# Patient Record
Sex: Female | Born: 1985
Health system: Southern US, Community
[De-identification: ages and names within clinical notes are randomized; demographics above are authoritative.]

## PROBLEM LIST (undated history)

## (undated) ENCOUNTER — Inpatient Hospital Stay: Payer: Self-pay

## (undated) DIAGNOSIS — F419 Anxiety disorder, unspecified: Secondary | ICD-10-CM

## (undated) DIAGNOSIS — R109 Unspecified abdominal pain: Secondary | ICD-10-CM

## (undated) DIAGNOSIS — F1910 Other psychoactive substance abuse, uncomplicated: Secondary | ICD-10-CM

## (undated) DIAGNOSIS — O219 Vomiting of pregnancy, unspecified: Secondary | ICD-10-CM

## (undated) DIAGNOSIS — Z3490 Encounter for supervision of normal pregnancy, unspecified, unspecified trimester: Secondary | ICD-10-CM

## (undated) DIAGNOSIS — K429 Umbilical hernia without obstruction or gangrene: Secondary | ICD-10-CM

## (undated) DIAGNOSIS — T148XXA Other injury of unspecified body region, initial encounter: Secondary | ICD-10-CM

## (undated) DIAGNOSIS — Z5329 Procedure and treatment not carried out because of patient's decision for other reasons: Secondary | ICD-10-CM

## (undated) DIAGNOSIS — O99213 Obesity complicating pregnancy, third trimester: Secondary | ICD-10-CM

## (undated) DIAGNOSIS — F319 Bipolar disorder, unspecified: Secondary | ICD-10-CM

## (undated) DIAGNOSIS — O26899 Other specified pregnancy related conditions, unspecified trimester: Secondary | ICD-10-CM

## (undated) DIAGNOSIS — Z91199 Patient's noncompliance with other medical treatment and regimen due to unspecified reason: Secondary | ICD-10-CM

## (undated) HISTORY — DX: Vomiting of pregnancy, unspecified: O21.9

## (undated) HISTORY — DX: Other injury of unspecified body region, initial encounter: T14.8XXA

## (undated) HISTORY — PX: TONSILLECTOMY: SUR1361

## (undated) HISTORY — PX: OVARY SURGERY: SHX727

## (undated) HISTORY — DX: Encounter for supervision of normal pregnancy, unspecified, unspecified trimester: Z34.90

## (undated) HISTORY — DX: Obesity complicating pregnancy, third trimester: O99.213

## (undated) HISTORY — DX: Other specified pregnancy related conditions, unspecified trimester: O26.899

## (undated) HISTORY — DX: Unspecified abdominal pain: R10.9

## (undated) HISTORY — DX: Umbilical hernia without obstruction or gangrene: K42.9

---

## 2007-12-09 ENCOUNTER — Emergency Department: Payer: Self-pay | Admitting: Emergency Medicine

## 2007-12-31 ENCOUNTER — Emergency Department: Payer: Self-pay | Admitting: Emergency Medicine

## 2008-01-02 ENCOUNTER — Emergency Department: Payer: Self-pay | Admitting: Internal Medicine

## 2008-05-09 ENCOUNTER — Emergency Department: Payer: Self-pay | Admitting: Emergency Medicine

## 2008-05-30 ENCOUNTER — Emergency Department: Payer: Self-pay | Admitting: Emergency Medicine

## 2008-07-19 ENCOUNTER — Emergency Department: Payer: Self-pay | Admitting: Emergency Medicine

## 2008-12-03 ENCOUNTER — Emergency Department: Payer: Self-pay | Admitting: Emergency Medicine

## 2008-12-06 ENCOUNTER — Emergency Department: Payer: Self-pay | Admitting: Emergency Medicine

## 2008-12-08 DIAGNOSIS — F17200 Nicotine dependence, unspecified, uncomplicated: Secondary | ICD-10-CM | POA: Diagnosis present

## 2010-03-11 ENCOUNTER — Emergency Department: Payer: Self-pay | Admitting: Unknown Physician Specialty

## 2010-04-08 ENCOUNTER — Ambulatory Visit: Payer: Self-pay | Admitting: Internal Medicine

## 2011-02-23 ENCOUNTER — Emergency Department: Payer: Self-pay | Admitting: Emergency Medicine

## 2011-04-09 HISTORY — PX: UNILATERAL SALPINGECTOMY: SHX6160

## 2011-06-27 DIAGNOSIS — Z0000 Encounter for general adult medical examination without abnormal findings: Secondary | ICD-10-CM | POA: Diagnosis not present

## 2011-06-27 DIAGNOSIS — B977 Papillomavirus as the cause of diseases classified elsewhere: Secondary | ICD-10-CM | POA: Diagnosis not present

## 2011-06-27 DIAGNOSIS — M6740 Ganglion, unspecified site: Secondary | ICD-10-CM | POA: Diagnosis not present

## 2011-06-27 DIAGNOSIS — R87612 Low grade squamous intraepithelial lesion on cytologic smear of cervix (LGSIL): Secondary | ICD-10-CM | POA: Diagnosis not present

## 2011-07-12 DIAGNOSIS — D219 Benign neoplasm of connective and other soft tissue, unspecified: Secondary | ICD-10-CM | POA: Diagnosis not present

## 2011-07-12 DIAGNOSIS — G5600 Carpal tunnel syndrome, unspecified upper limb: Secondary | ICD-10-CM | POA: Diagnosis not present

## 2011-07-14 ENCOUNTER — Emergency Department: Payer: Self-pay | Admitting: Emergency Medicine

## 2011-07-14 DIAGNOSIS — R109 Unspecified abdominal pain: Secondary | ICD-10-CM | POA: Diagnosis not present

## 2011-07-14 LAB — URINALYSIS, COMPLETE
Bilirubin,UR: NEGATIVE
Blood: NEGATIVE
Glucose,UR: NEGATIVE mg/dL (ref 0–75)
Leukocyte Esterase: NEGATIVE
Nitrite: NEGATIVE
Ph: 9 (ref 4.5–8.0)
Protein: 100
RBC,UR: 1 /HPF (ref 0–5)
Specific Gravity: 1.026 (ref 1.003–1.030)
Squamous Epithelial: 9
WBC UR: 1 /HPF (ref 0–5)

## 2011-07-14 LAB — PREGNANCY, URINE: Pregnancy Test, Urine: NEGATIVE m[IU]/mL

## 2011-07-22 DIAGNOSIS — W108XXA Fall (on) (from) other stairs and steps, initial encounter: Secondary | ICD-10-CM | POA: Diagnosis not present

## 2011-07-22 DIAGNOSIS — S301XXA Contusion of abdominal wall, initial encounter: Secondary | ICD-10-CM | POA: Diagnosis not present

## 2011-07-30 DIAGNOSIS — D219 Benign neoplasm of connective and other soft tissue, unspecified: Secondary | ICD-10-CM | POA: Diagnosis not present

## 2011-07-30 DIAGNOSIS — L980 Pyogenic granuloma: Secondary | ICD-10-CM | POA: Diagnosis not present

## 2011-07-30 DIAGNOSIS — G5600 Carpal tunnel syndrome, unspecified upper limb: Secondary | ICD-10-CM | POA: Diagnosis not present

## 2011-08-02 DIAGNOSIS — S7010XA Contusion of unspecified thigh, initial encounter: Secondary | ICD-10-CM | POA: Diagnosis not present

## 2011-08-02 DIAGNOSIS — E669 Obesity, unspecified: Secondary | ICD-10-CM | POA: Diagnosis not present

## 2011-08-08 DIAGNOSIS — N870 Mild cervical dysplasia: Secondary | ICD-10-CM | POA: Diagnosis not present

## 2011-08-08 DIAGNOSIS — N926 Irregular menstruation, unspecified: Secondary | ICD-10-CM | POA: Diagnosis not present

## 2011-08-08 DIAGNOSIS — R87612 Low grade squamous intraepithelial lesion on cytologic smear of cervix (LGSIL): Secondary | ICD-10-CM | POA: Diagnosis not present

## 2011-08-08 DIAGNOSIS — R87810 Cervical high risk human papillomavirus (HPV) DNA test positive: Secondary | ICD-10-CM | POA: Diagnosis not present

## 2011-08-12 DIAGNOSIS — D219 Benign neoplasm of connective and other soft tissue, unspecified: Secondary | ICD-10-CM | POA: Diagnosis not present

## 2011-09-25 DIAGNOSIS — E669 Obesity, unspecified: Secondary | ICD-10-CM | POA: Diagnosis not present

## 2011-09-25 DIAGNOSIS — S7010XA Contusion of unspecified thigh, initial encounter: Secondary | ICD-10-CM | POA: Diagnosis not present

## 2011-10-07 DIAGNOSIS — R112 Nausea with vomiting, unspecified: Secondary | ICD-10-CM | POA: Diagnosis not present

## 2011-10-07 DIAGNOSIS — R51 Headache: Secondary | ICD-10-CM | POA: Diagnosis not present

## 2011-10-09 ENCOUNTER — Emergency Department: Payer: Self-pay | Admitting: *Deleted

## 2011-10-09 DIAGNOSIS — R112 Nausea with vomiting, unspecified: Secondary | ICD-10-CM | POA: Diagnosis not present

## 2011-10-09 DIAGNOSIS — N83209 Unspecified ovarian cyst, unspecified side: Secondary | ICD-10-CM | POA: Diagnosis not present

## 2011-10-09 DIAGNOSIS — F17200 Nicotine dependence, unspecified, uncomplicated: Secondary | ICD-10-CM | POA: Diagnosis not present

## 2011-10-09 DIAGNOSIS — R1110 Vomiting, unspecified: Secondary | ICD-10-CM | POA: Diagnosis not present

## 2011-10-09 DIAGNOSIS — R1013 Epigastric pain: Secondary | ICD-10-CM | POA: Diagnosis not present

## 2011-10-09 DIAGNOSIS — D72829 Elevated white blood cell count, unspecified: Secondary | ICD-10-CM | POA: Diagnosis not present

## 2011-10-09 DIAGNOSIS — N838 Other noninflammatory disorders of ovary, fallopian tube and broad ligament: Secondary | ICD-10-CM | POA: Diagnosis not present

## 2011-10-09 LAB — CBC
HCT: 43.4 % (ref 35.0–47.0)
HGB: 14.1 g/dL (ref 12.0–16.0)
MCH: 29 pg (ref 26.0–34.0)
MCHC: 32.4 g/dL (ref 32.0–36.0)
MCV: 90 fL (ref 80–100)
Platelet: 260 10*3/uL (ref 150–440)
RBC: 4.85 10*6/uL (ref 3.80–5.20)
RDW: 13.2 % (ref 11.5–14.5)
WBC: 15.2 10*3/uL — ABNORMAL HIGH (ref 3.6–11.0)

## 2011-10-09 LAB — URINALYSIS, COMPLETE
Bilirubin,UR: NEGATIVE
Blood: NEGATIVE
Glucose,UR: NEGATIVE mg/dL (ref 0–75)
Leukocyte Esterase: NEGATIVE
Nitrite: NEGATIVE
Ph: 9 (ref 4.5–8.0)
Protein: >=500
RBC,UR: 16 /HPF (ref 0–5)
Specific Gravity: 1.028 (ref 1.003–1.030)
Squamous Epithelial: 1
WBC UR: NONE SEEN /HPF (ref 0–5)

## 2011-10-09 LAB — COMPREHENSIVE METABOLIC PANEL
Albumin: 3.8 g/dL (ref 3.4–5.0)
Alkaline Phosphatase: 92 U/L (ref 50–136)
Anion Gap: 3 — ABNORMAL LOW (ref 7–16)
BUN: 12 mg/dL (ref 7–18)
Bilirubin,Total: 0.4 mg/dL (ref 0.2–1.0)
Calcium, Total: 9.1 mg/dL (ref 8.5–10.1)
Chloride: 104 mmol/L (ref 98–107)
Co2: 31 mmol/L (ref 21–32)
Creatinine: 0.85 mg/dL (ref 0.60–1.30)
EGFR (African American): >60
EGFR (Non-African Amer.): >60
Glucose: 104 mg/dL — ABNORMAL HIGH (ref 65–99)
Osmolality: 276 (ref 275–301)
Potassium: 4.1 mmol/L (ref 3.5–5.1)
SGOT(AST): 21 U/L (ref 15–37)
SGPT (ALT): 27 U/L
Sodium: 138 mmol/L (ref 136–145)
Total Protein: 7.9 g/dL (ref 6.4–8.2)

## 2011-10-09 LAB — LIPASE, BLOOD: Lipase: 34 U/L — ABNORMAL LOW (ref 73–393)

## 2011-10-09 LAB — PREGNANCY, URINE: Pregnancy Test, Urine: NEGATIVE m[IU]/mL

## 2011-10-10 DIAGNOSIS — N838 Other noninflammatory disorders of ovary, fallopian tube and broad ligament: Secondary | ICD-10-CM | POA: Diagnosis not present

## 2011-10-10 DIAGNOSIS — R1013 Epigastric pain: Secondary | ICD-10-CM | POA: Diagnosis not present

## 2011-10-10 LAB — WET PREP, GENITAL

## 2011-10-12 DIAGNOSIS — R1032 Left lower quadrant pain: Secondary | ICD-10-CM | POA: Diagnosis not present

## 2011-10-12 DIAGNOSIS — I6200 Nontraumatic subdural hemorrhage, unspecified: Secondary | ICD-10-CM | POA: Diagnosis not present

## 2011-10-12 DIAGNOSIS — R112 Nausea with vomiting, unspecified: Secondary | ICD-10-CM | POA: Diagnosis not present

## 2011-10-13 DIAGNOSIS — S065X0A Traumatic subdural hemorrhage without loss of consciousness, initial encounter: Secondary | ICD-10-CM | POA: Diagnosis present

## 2011-10-13 DIAGNOSIS — S065X9A Traumatic subdural hemorrhage with loss of consciousness of unspecified duration, initial encounter: Secondary | ICD-10-CM | POA: Diagnosis not present

## 2011-10-13 DIAGNOSIS — S065XAA Traumatic subdural hemorrhage with loss of consciousness status unknown, initial encounter: Secondary | ICD-10-CM | POA: Insufficient documentation

## 2011-10-13 DIAGNOSIS — F17200 Nicotine dependence, unspecified, uncomplicated: Secondary | ICD-10-CM | POA: Diagnosis present

## 2011-10-13 DIAGNOSIS — I6200 Nontraumatic subdural hemorrhage, unspecified: Secondary | ICD-10-CM | POA: Diagnosis not present

## 2011-10-13 DIAGNOSIS — F319 Bipolar disorder, unspecified: Secondary | ICD-10-CM | POA: Diagnosis present

## 2011-10-13 DIAGNOSIS — E669 Obesity, unspecified: Secondary | ICD-10-CM | POA: Diagnosis present

## 2011-10-13 DIAGNOSIS — R1032 Left lower quadrant pain: Secondary | ICD-10-CM | POA: Diagnosis not present

## 2011-10-13 DIAGNOSIS — G936 Cerebral edema: Secondary | ICD-10-CM | POA: Diagnosis not present

## 2011-10-13 DIAGNOSIS — D72829 Elevated white blood cell count, unspecified: Secondary | ICD-10-CM | POA: Diagnosis present

## 2011-10-13 DIAGNOSIS — R112 Nausea with vomiting, unspecified: Secondary | ICD-10-CM | POA: Diagnosis not present

## 2011-10-13 DIAGNOSIS — Z7189 Other specified counseling: Secondary | ICD-10-CM | POA: Diagnosis not present

## 2011-10-13 DIAGNOSIS — N83209 Unspecified ovarian cyst, unspecified side: Secondary | ICD-10-CM | POA: Diagnosis present

## 2011-10-16 DIAGNOSIS — X58XXXA Exposure to other specified factors, initial encounter: Secondary | ICD-10-CM | POA: Diagnosis not present

## 2011-10-16 DIAGNOSIS — R4182 Altered mental status, unspecified: Secondary | ICD-10-CM | POA: Diagnosis not present

## 2011-10-16 DIAGNOSIS — I6200 Nontraumatic subdural hemorrhage, unspecified: Secondary | ICD-10-CM | POA: Diagnosis not present

## 2011-10-16 DIAGNOSIS — S065X9A Traumatic subdural hemorrhage with loss of consciousness of unspecified duration, initial encounter: Secondary | ICD-10-CM | POA: Diagnosis not present

## 2011-10-16 DIAGNOSIS — S065XAA Traumatic subdural hemorrhage with loss of consciousness status unknown, initial encounter: Secondary | ICD-10-CM | POA: Diagnosis not present

## 2011-10-17 DIAGNOSIS — F17200 Nicotine dependence, unspecified, uncomplicated: Secondary | ICD-10-CM | POA: Diagnosis present

## 2011-10-17 DIAGNOSIS — X58XXXA Exposure to other specified factors, initial encounter: Secondary | ICD-10-CM | POA: Diagnosis not present

## 2011-10-17 DIAGNOSIS — R4182 Altered mental status, unspecified: Secondary | ICD-10-CM | POA: Diagnosis present

## 2011-10-17 DIAGNOSIS — R569 Unspecified convulsions: Secondary | ICD-10-CM | POA: Diagnosis not present

## 2011-10-17 DIAGNOSIS — I6200 Nontraumatic subdural hemorrhage, unspecified: Secondary | ICD-10-CM | POA: Diagnosis present

## 2011-10-17 DIAGNOSIS — S065X9A Traumatic subdural hemorrhage with loss of consciousness of unspecified duration, initial encounter: Secondary | ICD-10-CM | POA: Diagnosis not present

## 2011-10-17 DIAGNOSIS — Z7189 Other specified counseling: Secondary | ICD-10-CM | POA: Diagnosis not present

## 2011-10-19 DIAGNOSIS — T148XXA Other injury of unspecified body region, initial encounter: Secondary | ICD-10-CM

## 2011-10-19 HISTORY — DX: Other injury of unspecified body region, initial encounter: T14.8XXA

## 2011-10-31 DIAGNOSIS — I6200 Nontraumatic subdural hemorrhage, unspecified: Secondary | ICD-10-CM | POA: Diagnosis not present

## 2011-10-31 DIAGNOSIS — Z87820 Personal history of traumatic brain injury: Secondary | ICD-10-CM | POA: Diagnosis not present

## 2011-10-31 DIAGNOSIS — S065X0A Traumatic subdural hemorrhage without loss of consciousness, initial encounter: Secondary | ICD-10-CM | POA: Diagnosis not present

## 2011-11-21 DIAGNOSIS — N979 Female infertility, unspecified: Secondary | ICD-10-CM | POA: Diagnosis not present

## 2011-11-21 DIAGNOSIS — N83209 Unspecified ovarian cyst, unspecified side: Secondary | ICD-10-CM | POA: Diagnosis not present

## 2011-11-21 DIAGNOSIS — Z3169 Encounter for other general counseling and advice on procreation: Secondary | ICD-10-CM | POA: Diagnosis not present

## 2011-12-02 DIAGNOSIS — Z202 Contact with and (suspected) exposure to infections with a predominantly sexual mode of transmission: Secondary | ICD-10-CM | POA: Diagnosis not present

## 2011-12-02 DIAGNOSIS — Z3169 Encounter for other general counseling and advice on procreation: Secondary | ICD-10-CM | POA: Diagnosis not present

## 2011-12-02 DIAGNOSIS — N83209 Unspecified ovarian cyst, unspecified side: Secondary | ICD-10-CM | POA: Diagnosis not present

## 2011-12-02 DIAGNOSIS — R1032 Left lower quadrant pain: Secondary | ICD-10-CM | POA: Diagnosis not present

## 2011-12-02 DIAGNOSIS — N949 Unspecified condition associated with female genital organs and menstrual cycle: Secondary | ICD-10-CM | POA: Diagnosis not present

## 2011-12-02 DIAGNOSIS — N979 Female infertility, unspecified: Secondary | ICD-10-CM | POA: Diagnosis not present

## 2011-12-04 ENCOUNTER — Ambulatory Visit: Payer: Self-pay | Admitting: Obstetrics & Gynecology

## 2011-12-04 DIAGNOSIS — N949 Unspecified condition associated with female genital organs and menstrual cycle: Secondary | ICD-10-CM | POA: Diagnosis not present

## 2011-12-04 DIAGNOSIS — D279 Benign neoplasm of unspecified ovary: Secondary | ICD-10-CM | POA: Diagnosis not present

## 2011-12-04 DIAGNOSIS — F411 Generalized anxiety disorder: Secondary | ICD-10-CM | POA: Diagnosis not present

## 2011-12-04 DIAGNOSIS — N83209 Unspecified ovarian cyst, unspecified side: Secondary | ICD-10-CM | POA: Diagnosis not present

## 2011-12-04 DIAGNOSIS — N926 Irregular menstruation, unspecified: Secondary | ICD-10-CM | POA: Diagnosis not present

## 2011-12-04 DIAGNOSIS — Z01812 Encounter for preprocedural laboratory examination: Secondary | ICD-10-CM | POA: Diagnosis not present

## 2011-12-04 LAB — CBC
HCT: 40.8 % (ref 35.0–47.0)
HGB: 13.6 g/dL (ref 12.0–16.0)
MCH: 29.7 pg (ref 26.0–34.0)
MCHC: 33.3 g/dL (ref 32.0–36.0)
MCV: 89 fL (ref 80–100)
Platelet: 224 10*3/uL (ref 150–440)
RBC: 4.59 10*6/uL (ref 3.80–5.20)
RDW: 13.7 % (ref 11.5–14.5)
WBC: 8.6 10*3/uL (ref 3.6–11.0)

## 2011-12-13 ENCOUNTER — Ambulatory Visit: Payer: Self-pay | Admitting: Obstetrics & Gynecology

## 2011-12-13 DIAGNOSIS — F17200 Nicotine dependence, unspecified, uncomplicated: Secondary | ICD-10-CM | POA: Diagnosis not present

## 2011-12-13 DIAGNOSIS — N8300 Follicular cyst of ovary, unspecified side: Secondary | ICD-10-CM | POA: Diagnosis not present

## 2011-12-13 DIAGNOSIS — D279 Benign neoplasm of unspecified ovary: Secondary | ICD-10-CM | POA: Diagnosis not present

## 2011-12-13 DIAGNOSIS — Z6834 Body mass index (BMI) 34.0-34.9, adult: Secondary | ICD-10-CM | POA: Diagnosis not present

## 2011-12-13 DIAGNOSIS — F411 Generalized anxiety disorder: Secondary | ICD-10-CM | POA: Diagnosis not present

## 2011-12-13 DIAGNOSIS — E669 Obesity, unspecified: Secondary | ICD-10-CM | POA: Diagnosis not present

## 2011-12-13 DIAGNOSIS — F319 Bipolar disorder, unspecified: Secondary | ICD-10-CM | POA: Diagnosis not present

## 2011-12-13 DIAGNOSIS — Z79899 Other long term (current) drug therapy: Secondary | ICD-10-CM | POA: Diagnosis not present

## 2011-12-13 DIAGNOSIS — K3189 Other diseases of stomach and duodenum: Secondary | ICD-10-CM | POA: Diagnosis not present

## 2011-12-13 LAB — DRUG SCREEN, URINE
Amphetamines, Ur Screen: NEGATIVE (ref ?–1000)
Barbiturates, Ur Screen: NEGATIVE (ref ?–200)
Benzodiazepine, Ur Scrn: NEGATIVE (ref ?–200)
Cannabinoid 50 Ng, Ur ~~LOC~~: POSITIVE (ref ?–50)
Cocaine Metabolite,Ur ~~LOC~~: NEGATIVE (ref ?–300)
MDMA (Ecstasy)Ur Screen: NEGATIVE (ref ?–500)
Methadone, Ur Screen: NEGATIVE (ref ?–300)
Opiate, Ur Screen: NEGATIVE (ref ?–300)
Phencyclidine (PCP) Ur S: NEGATIVE (ref ?–25)
Tricyclic, Ur Screen: NEGATIVE (ref ?–1000)

## 2011-12-13 LAB — PREGNANCY, URINE: Pregnancy Test, Urine: NEGATIVE m[IU]/mL

## 2011-12-17 LAB — PATHOLOGY REPORT

## 2012-01-02 DIAGNOSIS — Z3169 Encounter for other general counseling and advice on procreation: Secondary | ICD-10-CM | POA: Diagnosis not present

## 2012-01-02 DIAGNOSIS — N979 Female infertility, unspecified: Secondary | ICD-10-CM | POA: Diagnosis not present

## 2012-01-02 DIAGNOSIS — E669 Obesity, unspecified: Secondary | ICD-10-CM | POA: Diagnosis not present

## 2012-03-22 ENCOUNTER — Emergency Department: Payer: Self-pay | Admitting: Internal Medicine

## 2012-03-25 ENCOUNTER — Inpatient Hospital Stay: Payer: Self-pay | Admitting: Surgery

## 2012-03-25 DIAGNOSIS — K810 Acute cholecystitis: Secondary | ICD-10-CM | POA: Diagnosis not present

## 2012-03-25 DIAGNOSIS — R112 Nausea with vomiting, unspecified: Secondary | ICD-10-CM | POA: Diagnosis not present

## 2012-03-25 DIAGNOSIS — R109 Unspecified abdominal pain: Secondary | ICD-10-CM | POA: Diagnosis not present

## 2012-03-25 LAB — COMPREHENSIVE METABOLIC PANEL
Albumin: 4 g/dL (ref 3.4–5.0)
Alkaline Phosphatase: 86 U/L (ref 50–136)
Anion Gap: 8 (ref 7–16)
BUN: 10 mg/dL (ref 7–18)
Bilirubin,Total: 0.5 mg/dL (ref 0.2–1.0)
Calcium, Total: 8.8 mg/dL (ref 8.5–10.1)
Chloride: 107 mmol/L (ref 98–107)
Co2: 23 mmol/L (ref 21–32)
Creatinine: 0.68 mg/dL (ref 0.60–1.30)
EGFR (African American): >60
EGFR (Non-African Amer.): >60
Glucose: 144 mg/dL — ABNORMAL HIGH (ref 65–99)
Osmolality: 277 (ref 275–301)
Potassium: 3.4 mmol/L — ABNORMAL LOW (ref 3.5–5.1)
SGOT(AST): 8 U/L — ABNORMAL LOW (ref 15–37)
SGPT (ALT): 16 U/L (ref 12–78)
Sodium: 138 mmol/L (ref 136–145)
Total Protein: 7.7 g/dL (ref 6.4–8.2)

## 2012-03-25 LAB — CBC
HCT: 41.6 % (ref 35.0–47.0)
HGB: 13.9 g/dL (ref 12.0–16.0)
MCH: 29.6 pg (ref 26.0–34.0)
MCHC: 33.5 g/dL (ref 32.0–36.0)
MCV: 88 fL (ref 80–100)
Platelet: 217 10*3/uL (ref 150–440)
RBC: 4.71 10*6/uL (ref 3.80–5.20)
RDW: 13.6 % (ref 11.5–14.5)
WBC: 12.6 10*3/uL — ABNORMAL HIGH (ref 3.6–11.0)

## 2012-03-25 LAB — URINALYSIS, COMPLETE
Bacteria: NONE SEEN
Bilirubin,UR: NEGATIVE
Blood: NEGATIVE
Glucose,UR: NEGATIVE mg/dL (ref 0–75)
Leukocyte Esterase: NEGATIVE
Nitrite: NEGATIVE
Ph: 9 (ref 4.5–8.0)
Protein: NEGATIVE
RBC,UR: 1 /HPF (ref 0–5)
Specific Gravity: 1.014 (ref 1.003–1.030)
Squamous Epithelial: 7
WBC UR: 2 /HPF (ref 0–5)

## 2012-03-25 LAB — LIPASE, BLOOD: Lipase: 63 U/L — ABNORMAL LOW (ref 73–393)

## 2012-03-25 LAB — DRUG SCREEN, URINE

## 2012-03-26 DIAGNOSIS — R1011 Right upper quadrant pain: Secondary | ICD-10-CM | POA: Diagnosis not present

## 2012-03-26 LAB — COMPREHENSIVE METABOLIC PANEL
Albumin: 3.5 g/dL (ref 3.4–5.0)
Alkaline Phosphatase: 80 U/L (ref 50–136)
Anion Gap: 6 — ABNORMAL LOW (ref 7–16)
BUN: 6 mg/dL — ABNORMAL LOW (ref 7–18)
Bilirubin,Total: 0.4 mg/dL (ref 0.2–1.0)
Calcium, Total: 8.9 mg/dL (ref 8.5–10.1)
Chloride: 109 mmol/L — ABNORMAL HIGH (ref 98–107)
Co2: 28 mmol/L (ref 21–32)
Creatinine: 0.74 mg/dL (ref 0.60–1.30)
EGFR (African American): >60
EGFR (Non-African Amer.): >60
Glucose: 86 mg/dL (ref 65–99)
Osmolality: 282 (ref 275–301)
Potassium: 3.3 mmol/L — ABNORMAL LOW (ref 3.5–5.1)
SGOT(AST): 16 U/L (ref 15–37)
SGPT (ALT): 15 U/L (ref 12–78)
Sodium: 143 mmol/L (ref 136–145)
Total Protein: 6.5 g/dL (ref 6.4–8.2)

## 2012-03-26 LAB — CBC WITH DIFFERENTIAL/PLATELET
Basophil #: 0 10*3/uL (ref 0.0–0.1)
Basophil %: 0.3 %
Eosinophil #: 0 10*3/uL (ref 0.0–0.7)
Eosinophil %: 0.2 %
HCT: 39.8 % (ref 35.0–47.0)
HGB: 13.2 g/dL (ref 12.0–16.0)
Lymphocyte #: 3.5 10*3/uL (ref 1.0–3.6)
Lymphocyte %: 27.8 %
MCH: 29.6 pg (ref 26.0–34.0)
MCHC: 33.1 g/dL (ref 32.0–36.0)
MCV: 89 fL (ref 80–100)
Monocyte #: 1.2 x10 3/mm — ABNORMAL HIGH (ref 0.2–0.9)
Monocyte %: 9 %
Neutrophil #: 8 10*3/uL — ABNORMAL HIGH (ref 1.4–6.5)
Neutrophil %: 62.7 %
Platelet: 217 10*3/uL (ref 150–440)
RBC: 4.45 10*6/uL (ref 3.80–5.20)
RDW: 13.5 % (ref 11.5–14.5)
WBC: 12.8 10*3/uL — ABNORMAL HIGH (ref 3.6–11.0)

## 2012-04-20 DIAGNOSIS — E669 Obesity, unspecified: Secondary | ICD-10-CM | POA: Diagnosis not present

## 2012-04-20 DIAGNOSIS — N979 Female infertility, unspecified: Secondary | ICD-10-CM | POA: Diagnosis not present

## 2012-04-20 DIAGNOSIS — F410 Panic disorder [episodic paroxysmal anxiety] without agoraphobia: Secondary | ICD-10-CM | POA: Diagnosis not present

## 2012-06-02 DIAGNOSIS — F410 Panic disorder [episodic paroxysmal anxiety] without agoraphobia: Secondary | ICD-10-CM | POA: Diagnosis not present

## 2012-06-02 DIAGNOSIS — N979 Female infertility, unspecified: Secondary | ICD-10-CM | POA: Diagnosis not present

## 2012-06-02 DIAGNOSIS — M7650 Patellar tendinitis, unspecified knee: Secondary | ICD-10-CM | POA: Diagnosis not present

## 2012-06-27 ENCOUNTER — Emergency Department: Payer: Self-pay | Admitting: Emergency Medicine

## 2012-06-27 DIAGNOSIS — R112 Nausea with vomiting, unspecified: Secondary | ICD-10-CM | POA: Diagnosis not present

## 2012-06-27 DIAGNOSIS — R1084 Generalized abdominal pain: Secondary | ICD-10-CM | POA: Diagnosis not present

## 2012-06-27 DIAGNOSIS — R1011 Right upper quadrant pain: Secondary | ICD-10-CM | POA: Diagnosis not present

## 2012-06-27 DIAGNOSIS — K828 Other specified diseases of gallbladder: Secondary | ICD-10-CM | POA: Diagnosis not present

## 2012-06-27 DIAGNOSIS — K8020 Calculus of gallbladder without cholecystitis without obstruction: Secondary | ICD-10-CM | POA: Diagnosis not present

## 2012-06-27 DIAGNOSIS — Z9889 Other specified postprocedural states: Secondary | ICD-10-CM | POA: Diagnosis not present

## 2012-06-27 LAB — URINALYSIS, COMPLETE
Bilirubin,UR: NEGATIVE
Blood: NEGATIVE
Glucose,UR: NEGATIVE mg/dL (ref 0–75)
Ketone: NEGATIVE
Nitrite: NEGATIVE
Ph: 8 (ref 4.5–8.0)
Protein: 30
RBC,UR: 1 /HPF (ref 0–5)
Specific Gravity: 1.024 (ref 1.003–1.030)
Squamous Epithelial: 16
WBC UR: 1 /HPF (ref 0–5)

## 2012-06-27 LAB — COMPREHENSIVE METABOLIC PANEL
Albumin: 3.5 g/dL (ref 3.4–5.0)
Alkaline Phosphatase: 94 U/L (ref 50–136)
Anion Gap: 7 (ref 7–16)
BUN: 8 mg/dL (ref 7–18)
Bilirubin,Total: 0.4 mg/dL (ref 0.2–1.0)
Calcium, Total: 8.8 mg/dL (ref 8.5–10.1)
Chloride: 107 mmol/L (ref 98–107)
Co2: 26 mmol/L (ref 21–32)
Creatinine: 0.71 mg/dL (ref 0.60–1.30)
EGFR (African American): >60
EGFR (Non-African Amer.): >60
Glucose: 115 mg/dL — ABNORMAL HIGH (ref 65–99)
Osmolality: 279 (ref 275–301)
Potassium: 3.6 mmol/L (ref 3.5–5.1)
SGOT(AST): 22 U/L (ref 15–37)
SGPT (ALT): 19 U/L (ref 12–78)
Sodium: 140 mmol/L (ref 136–145)
Total Protein: 7.5 g/dL (ref 6.4–8.2)

## 2012-06-27 LAB — CBC WITH DIFFERENTIAL/PLATELET
Basophil #: 0.1 10*3/uL (ref 0.0–0.1)
Basophil %: 0.7 %
Eosinophil #: 0 10*3/uL (ref 0.0–0.7)
Eosinophil %: 0.4 %
HCT: 43.5 % (ref 35.0–47.0)
HGB: 14.8 g/dL (ref 12.0–16.0)
Lymphocyte #: 2.2 10*3/uL (ref 1.0–3.6)
Lymphocyte %: 20.5 %
MCH: 29.5 pg (ref 26.0–34.0)
MCHC: 34 g/dL (ref 32.0–36.0)
MCV: 87 fL (ref 80–100)
Monocyte #: 1.1 x10 3/mm — ABNORMAL HIGH (ref 0.2–0.9)
Monocyte %: 10.2 %
Neutrophil #: 7.5 10*3/uL — ABNORMAL HIGH (ref 1.4–6.5)
Neutrophil %: 68.2 %
Platelet: 228 10*3/uL (ref 150–440)
RBC: 5.01 10*6/uL (ref 3.80–5.20)
RDW: 14 % (ref 11.5–14.5)
WBC: 10.9 10*3/uL (ref 3.6–11.0)

## 2012-06-27 LAB — LIPASE, BLOOD: Lipase: 60 U/L — ABNORMAL LOW (ref 73–393)

## 2012-06-27 LAB — PREGNANCY, URINE: Pregnancy Test, Urine: NEGATIVE m[IU]/mL

## 2012-07-18 DIAGNOSIS — R1110 Vomiting, unspecified: Secondary | ICD-10-CM | POA: Diagnosis not present

## 2012-07-18 DIAGNOSIS — F17200 Nicotine dependence, unspecified, uncomplicated: Secondary | ICD-10-CM | POA: Diagnosis not present

## 2012-07-18 DIAGNOSIS — R1011 Right upper quadrant pain: Secondary | ICD-10-CM | POA: Diagnosis not present

## 2012-07-18 DIAGNOSIS — K828 Other specified diseases of gallbladder: Secondary | ICD-10-CM | POA: Diagnosis not present

## 2012-07-18 DIAGNOSIS — R109 Unspecified abdominal pain: Secondary | ICD-10-CM | POA: Diagnosis not present

## 2012-07-18 DIAGNOSIS — R197 Diarrhea, unspecified: Secondary | ICD-10-CM | POA: Diagnosis not present

## 2012-07-18 DIAGNOSIS — R1013 Epigastric pain: Secondary | ICD-10-CM | POA: Diagnosis not present

## 2012-07-19 DIAGNOSIS — K8020 Calculus of gallbladder without cholecystitis without obstruction: Secondary | ICD-10-CM | POA: Diagnosis not present

## 2012-07-19 DIAGNOSIS — R1011 Right upper quadrant pain: Secondary | ICD-10-CM | POA: Diagnosis not present

## 2012-11-30 DIAGNOSIS — M25569 Pain in unspecified knee: Secondary | ICD-10-CM | POA: Diagnosis not present

## 2012-11-30 DIAGNOSIS — F410 Panic disorder [episodic paroxysmal anxiety] without agoraphobia: Secondary | ICD-10-CM | POA: Diagnosis not present

## 2012-11-30 DIAGNOSIS — E669 Obesity, unspecified: Secondary | ICD-10-CM | POA: Diagnosis not present

## 2012-12-04 DIAGNOSIS — M2240 Chondromalacia patellae, unspecified knee: Secondary | ICD-10-CM | POA: Diagnosis not present

## 2012-12-15 DIAGNOSIS — M25569 Pain in unspecified knee: Secondary | ICD-10-CM | POA: Diagnosis not present

## 2012-12-15 DIAGNOSIS — R109 Unspecified abdominal pain: Secondary | ICD-10-CM | POA: Diagnosis not present

## 2012-12-15 DIAGNOSIS — H9209 Otalgia, unspecified ear: Secondary | ICD-10-CM | POA: Diagnosis not present

## 2013-02-24 DIAGNOSIS — F341 Dysthymic disorder: Secondary | ICD-10-CM | POA: Diagnosis not present

## 2013-02-24 DIAGNOSIS — M25569 Pain in unspecified knee: Secondary | ICD-10-CM | POA: Diagnosis not present

## 2013-02-24 DIAGNOSIS — E669 Obesity, unspecified: Secondary | ICD-10-CM | POA: Diagnosis not present

## 2013-02-25 DIAGNOSIS — R1084 Generalized abdominal pain: Secondary | ICD-10-CM | POA: Diagnosis not present

## 2013-03-23 DIAGNOSIS — F3110 Bipolar disorder, current episode manic without psychotic features, unspecified: Secondary | ICD-10-CM | POA: Diagnosis not present

## 2013-03-23 DIAGNOSIS — F411 Generalized anxiety disorder: Secondary | ICD-10-CM | POA: Diagnosis not present

## 2013-03-23 DIAGNOSIS — F909 Attention-deficit hyperactivity disorder, unspecified type: Secondary | ICD-10-CM | POA: Diagnosis not present

## 2013-04-08 HISTORY — PX: THROAT SURGERY: SHX803

## 2013-04-13 DIAGNOSIS — F909 Attention-deficit hyperactivity disorder, unspecified type: Secondary | ICD-10-CM | POA: Diagnosis not present

## 2013-04-13 DIAGNOSIS — E669 Obesity, unspecified: Secondary | ICD-10-CM | POA: Diagnosis not present

## 2013-04-13 DIAGNOSIS — M25569 Pain in unspecified knee: Secondary | ICD-10-CM | POA: Diagnosis not present

## 2013-04-13 DIAGNOSIS — L301 Dyshidrosis [pompholyx]: Secondary | ICD-10-CM | POA: Diagnosis not present

## 2013-04-20 DIAGNOSIS — F3110 Bipolar disorder, current episode manic without psychotic features, unspecified: Secondary | ICD-10-CM | POA: Diagnosis not present

## 2013-04-20 DIAGNOSIS — M25569 Pain in unspecified knee: Secondary | ICD-10-CM | POA: Diagnosis not present

## 2013-04-20 DIAGNOSIS — R609 Edema, unspecified: Secondary | ICD-10-CM | POA: Diagnosis not present

## 2013-04-20 DIAGNOSIS — E669 Obesity, unspecified: Secondary | ICD-10-CM | POA: Diagnosis not present

## 2013-04-20 DIAGNOSIS — F909 Attention-deficit hyperactivity disorder, unspecified type: Secondary | ICD-10-CM | POA: Diagnosis not present

## 2013-04-20 DIAGNOSIS — F411 Generalized anxiety disorder: Secondary | ICD-10-CM | POA: Diagnosis not present

## 2013-05-18 DIAGNOSIS — F4001 Agoraphobia with panic disorder: Secondary | ICD-10-CM | POA: Diagnosis not present

## 2013-05-18 DIAGNOSIS — F909 Attention-deficit hyperactivity disorder, unspecified type: Secondary | ICD-10-CM | POA: Diagnosis not present

## 2013-05-18 DIAGNOSIS — F411 Generalized anxiety disorder: Secondary | ICD-10-CM | POA: Diagnosis not present

## 2013-05-18 DIAGNOSIS — F3110 Bipolar disorder, current episode manic without psychotic features, unspecified: Secondary | ICD-10-CM | POA: Diagnosis not present

## 2013-06-15 DIAGNOSIS — F411 Generalized anxiety disorder: Secondary | ICD-10-CM | POA: Diagnosis not present

## 2013-06-15 DIAGNOSIS — F4001 Agoraphobia with panic disorder: Secondary | ICD-10-CM | POA: Diagnosis not present

## 2013-06-15 DIAGNOSIS — F909 Attention-deficit hyperactivity disorder, unspecified type: Secondary | ICD-10-CM | POA: Diagnosis not present

## 2013-06-15 DIAGNOSIS — F3110 Bipolar disorder, current episode manic without psychotic features, unspecified: Secondary | ICD-10-CM | POA: Diagnosis not present

## 2013-06-24 DIAGNOSIS — M25569 Pain in unspecified knee: Secondary | ICD-10-CM | POA: Diagnosis not present

## 2013-06-24 DIAGNOSIS — J029 Acute pharyngitis, unspecified: Secondary | ICD-10-CM | POA: Diagnosis not present

## 2013-06-24 DIAGNOSIS — R490 Dysphonia: Secondary | ICD-10-CM | POA: Diagnosis not present

## 2013-07-01 DIAGNOSIS — Z331 Pregnant state, incidental: Secondary | ICD-10-CM | POA: Diagnosis not present

## 2013-07-01 DIAGNOSIS — M25569 Pain in unspecified knee: Secondary | ICD-10-CM | POA: Diagnosis not present

## 2013-07-01 DIAGNOSIS — R599 Enlarged lymph nodes, unspecified: Secondary | ICD-10-CM | POA: Diagnosis not present

## 2013-07-05 ENCOUNTER — Emergency Department: Payer: Self-pay | Admitting: Emergency Medicine

## 2013-07-09 ENCOUNTER — Emergency Department: Payer: Self-pay | Admitting: Emergency Medicine

## 2013-07-09 DIAGNOSIS — R109 Unspecified abdominal pain: Secondary | ICD-10-CM | POA: Diagnosis not present

## 2013-07-09 DIAGNOSIS — O9989 Other specified diseases and conditions complicating pregnancy, childbirth and the puerperium: Secondary | ICD-10-CM | POA: Diagnosis not present

## 2013-07-09 LAB — CBC WITH DIFFERENTIAL/PLATELET
Basophil #: 0.1 10*3/uL (ref 0.0–0.1)
Basophil %: 0.4 %
Eosinophil #: 0 10*3/uL (ref 0.0–0.7)
Eosinophil %: 0 %
HCT: 43.7 % (ref 35.0–47.0)
HGB: 14.5 g/dL (ref 12.0–16.0)
Lymphocyte #: 1 10*3/uL (ref 1.0–3.6)
Lymphocyte %: 6.6 %
MCH: 29.5 pg (ref 26.0–34.0)
MCHC: 33.3 g/dL (ref 32.0–36.0)
MCV: 89 fL (ref 80–100)
Monocyte #: 0.2 x10 3/mm (ref 0.2–0.9)
Monocyte %: 1.6 %
Neutrophil #: 13.3 10*3/uL — ABNORMAL HIGH (ref 1.4–6.5)
Neutrophil %: 91.4 %
Platelet: 225 10*3/uL (ref 150–440)
RBC: 4.93 10*6/uL (ref 3.80–5.20)
RDW: 12.7 % (ref 11.5–14.5)
WBC: 14.5 10*3/uL — ABNORMAL HIGH (ref 3.6–11.0)

## 2013-07-09 LAB — HCG, QUANTITATIVE, PREGNANCY: Beta Hcg, Quant.: 37046 m[IU]/mL — ABNORMAL HIGH

## 2013-07-09 LAB — COMPREHENSIVE METABOLIC PANEL
Albumin: 3.7 g/dL (ref 3.4–5.0)
Alkaline Phosphatase: 67 U/L
Anion Gap: 10 (ref 7–16)
BUN: 10 mg/dL (ref 7–18)
Bilirubin,Total: 0.4 mg/dL (ref 0.2–1.0)
Calcium, Total: 8.9 mg/dL (ref 8.5–10.1)
Chloride: 106 mmol/L (ref 98–107)
Co2: 21 mmol/L (ref 21–32)
Creatinine: 0.61 mg/dL (ref 0.60–1.30)
EGFR (African American): >60
EGFR (Non-African Amer.): >60
Glucose: 118 mg/dL — ABNORMAL HIGH (ref 65–99)
Osmolality: 274 (ref 275–301)
Potassium: 3.6 mmol/L (ref 3.5–5.1)
SGOT(AST): 12 U/L — ABNORMAL LOW (ref 15–37)
SGPT (ALT): 18 U/L (ref 12–78)
Sodium: 137 mmol/L (ref 136–145)
Total Protein: 7.7 g/dL (ref 6.4–8.2)

## 2013-07-09 LAB — LIPASE, BLOOD: Lipase: 60 U/L — ABNORMAL LOW (ref 73–393)

## 2013-07-10 DIAGNOSIS — O99330 Smoking (tobacco) complicating pregnancy, unspecified trimester: Secondary | ICD-10-CM | POA: Diagnosis not present

## 2013-07-10 DIAGNOSIS — R1110 Vomiting, unspecified: Secondary | ICD-10-CM | POA: Diagnosis not present

## 2013-07-10 DIAGNOSIS — F411 Generalized anxiety disorder: Secondary | ICD-10-CM | POA: Diagnosis not present

## 2013-07-10 DIAGNOSIS — O99891 Other specified diseases and conditions complicating pregnancy: Secondary | ICD-10-CM | POA: Diagnosis not present

## 2013-07-10 DIAGNOSIS — R1013 Epigastric pain: Secondary | ICD-10-CM | POA: Diagnosis not present

## 2013-07-10 DIAGNOSIS — R1011 Right upper quadrant pain: Secondary | ICD-10-CM | POA: Diagnosis not present

## 2013-07-10 DIAGNOSIS — Z79899 Other long term (current) drug therapy: Secondary | ICD-10-CM | POA: Diagnosis not present

## 2013-07-10 DIAGNOSIS — O210 Mild hyperemesis gravidarum: Secondary | ICD-10-CM | POA: Diagnosis not present

## 2013-07-10 DIAGNOSIS — Z36 Encounter for antenatal screening of mother: Secondary | ICD-10-CM | POA: Diagnosis not present

## 2013-07-16 DIAGNOSIS — Z3480 Encounter for supervision of other normal pregnancy, unspecified trimester: Secondary | ICD-10-CM | POA: Diagnosis not present

## 2013-07-16 DIAGNOSIS — R87612 Low grade squamous intraepithelial lesion on cytologic smear of cervix (LGSIL): Secondary | ICD-10-CM | POA: Diagnosis not present

## 2013-07-16 DIAGNOSIS — R87610 Atypical squamous cells of undetermined significance on cytologic smear of cervix (ASC-US): Secondary | ICD-10-CM | POA: Diagnosis not present

## 2013-07-16 DIAGNOSIS — Z36 Encounter for antenatal screening of mother: Secondary | ICD-10-CM | POA: Diagnosis not present

## 2013-07-16 DIAGNOSIS — Z113 Encounter for screening for infections with a predominantly sexual mode of transmission: Secondary | ICD-10-CM | POA: Diagnosis not present

## 2013-07-19 DIAGNOSIS — O99891 Other specified diseases and conditions complicating pregnancy: Secondary | ICD-10-CM | POA: Diagnosis not present

## 2013-07-19 DIAGNOSIS — O99330 Smoking (tobacco) complicating pregnancy, unspecified trimester: Secondary | ICD-10-CM | POA: Diagnosis not present

## 2013-07-19 DIAGNOSIS — F411 Generalized anxiety disorder: Secondary | ICD-10-CM | POA: Diagnosis not present

## 2013-07-19 DIAGNOSIS — O210 Mild hyperemesis gravidarum: Secondary | ICD-10-CM | POA: Diagnosis not present

## 2013-07-19 DIAGNOSIS — R1013 Epigastric pain: Secondary | ICD-10-CM | POA: Diagnosis not present

## 2013-07-22 DIAGNOSIS — O9989 Other specified diseases and conditions complicating pregnancy, childbirth and the puerperium: Secondary | ICD-10-CM | POA: Diagnosis not present

## 2013-07-22 DIAGNOSIS — E669 Obesity, unspecified: Secondary | ICD-10-CM | POA: Diagnosis not present

## 2013-07-22 DIAGNOSIS — Z3480 Encounter for supervision of other normal pregnancy, unspecified trimester: Secondary | ICD-10-CM | POA: Diagnosis not present

## 2013-07-22 DIAGNOSIS — Z131 Encounter for screening for diabetes mellitus: Secondary | ICD-10-CM | POA: Diagnosis not present

## 2013-08-20 DIAGNOSIS — Z3480 Encounter for supervision of other normal pregnancy, unspecified trimester: Secondary | ICD-10-CM | POA: Diagnosis not present

## 2013-08-20 DIAGNOSIS — Z36 Encounter for antenatal screening of mother: Secondary | ICD-10-CM | POA: Diagnosis not present

## 2013-08-20 DIAGNOSIS — IMO0002 Reserved for concepts with insufficient information to code with codable children: Secondary | ICD-10-CM | POA: Diagnosis not present

## 2013-09-22 DIAGNOSIS — F4001 Agoraphobia with panic disorder: Secondary | ICD-10-CM | POA: Diagnosis not present

## 2013-09-22 DIAGNOSIS — F3110 Bipolar disorder, current episode manic without psychotic features, unspecified: Secondary | ICD-10-CM | POA: Diagnosis not present

## 2013-09-22 DIAGNOSIS — F909 Attention-deficit hyperactivity disorder, unspecified type: Secondary | ICD-10-CM | POA: Diagnosis not present

## 2013-09-22 DIAGNOSIS — F411 Generalized anxiety disorder: Secondary | ICD-10-CM | POA: Diagnosis not present

## 2013-10-14 IMAGING — US US PELV - US TRANSVAGINAL
1 series · 14 of 25 positions shown · non-contrast
Comparison: none

REASON FOR EXAM: left cystic mass on ct, r/o torsion vs abscess
COMMENTS:

[Series 1: us pelv - us transvaginal · 0.25mm/px · 14 of 58 slices shown]
[im 1/58]
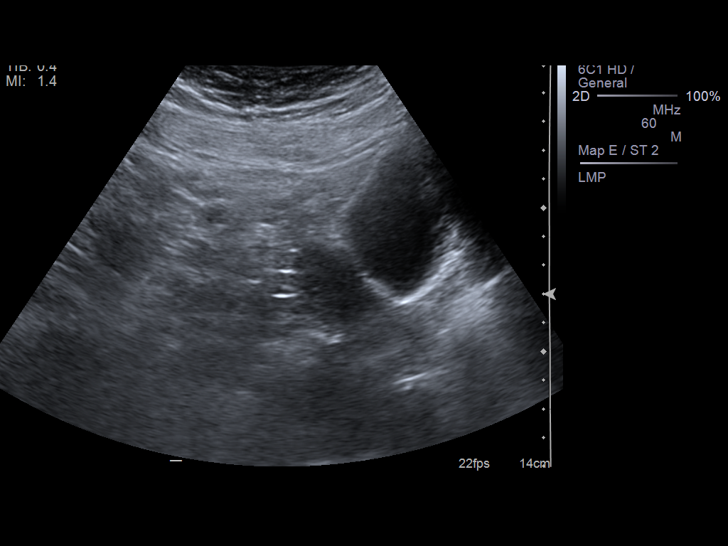
[im 5/58]
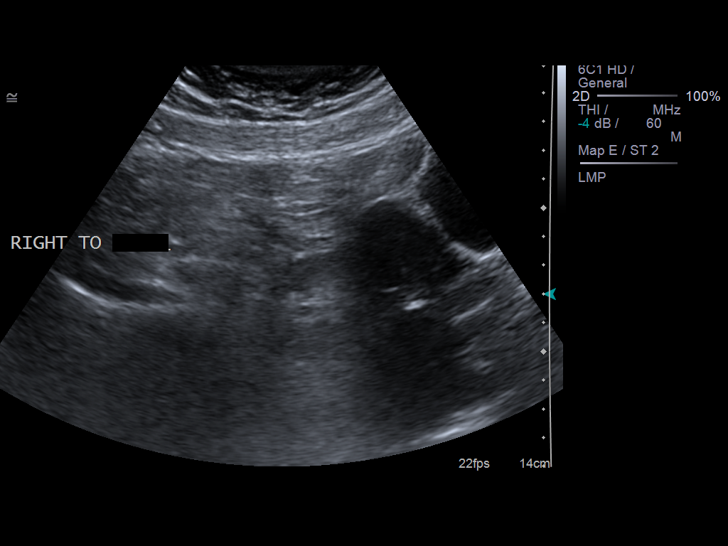
[im 10/58]
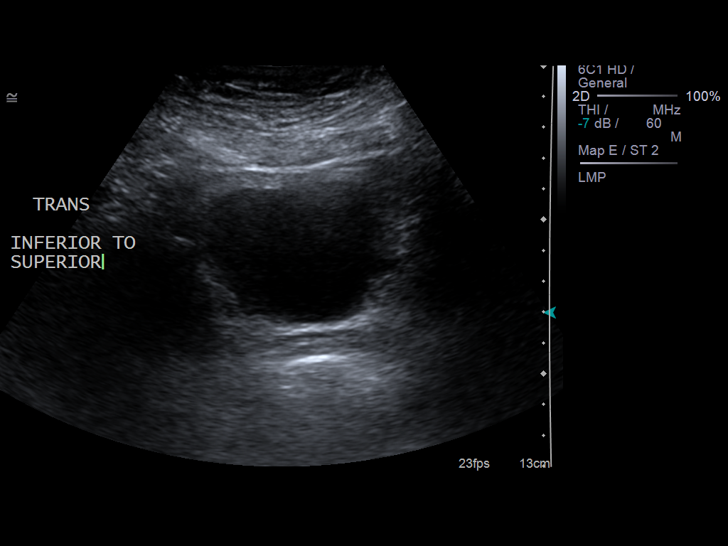
[im 15/58]
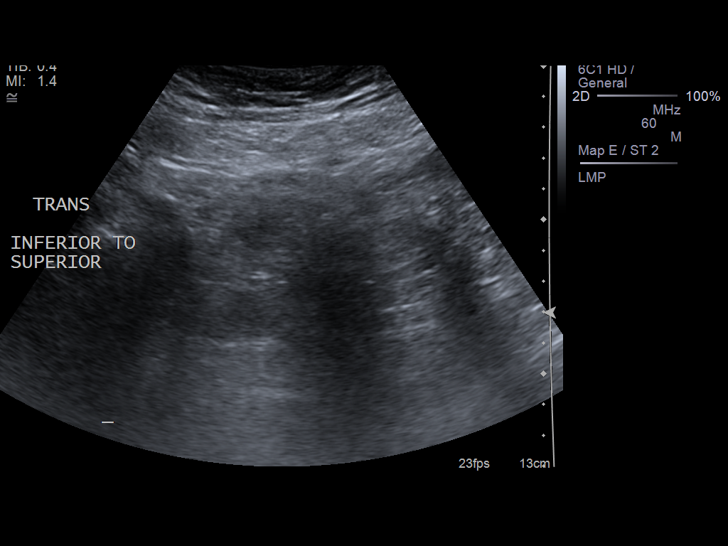
[im 20/58]
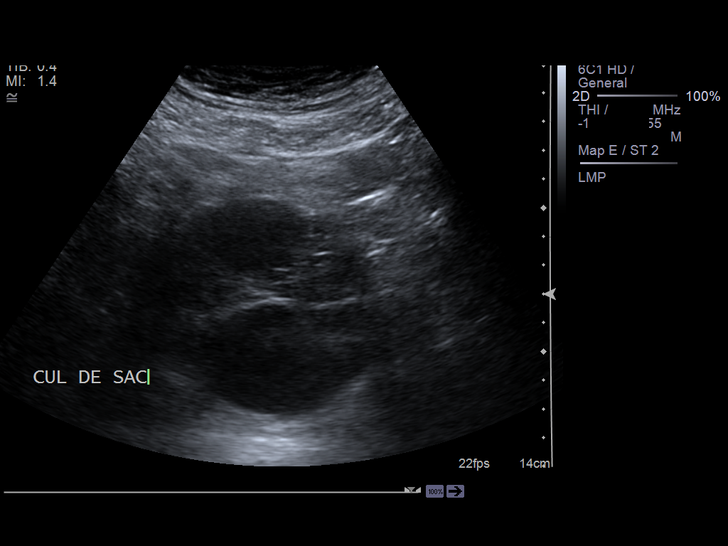
[im 22/58]
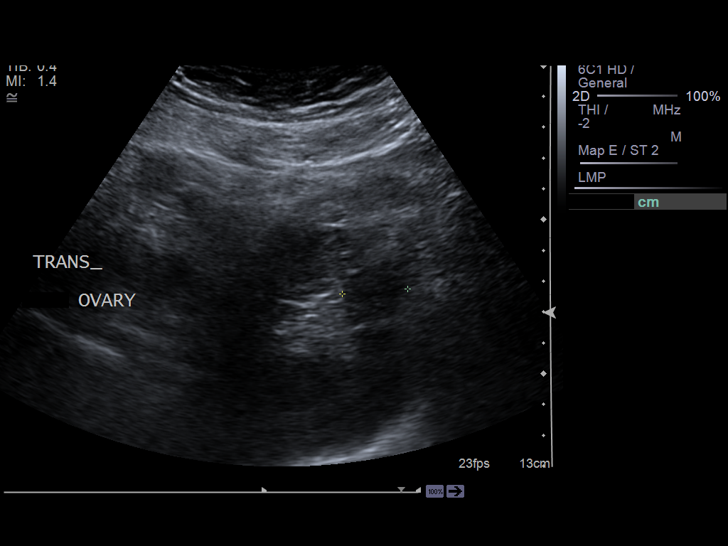
[im 27/58]
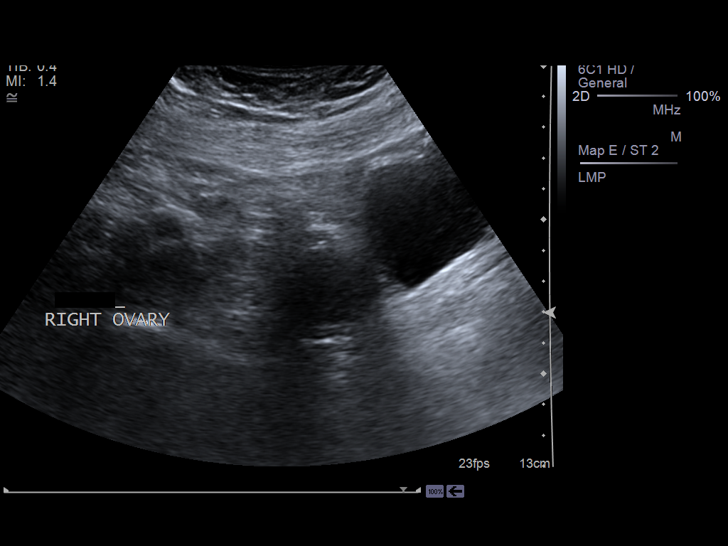
[im 31/58]
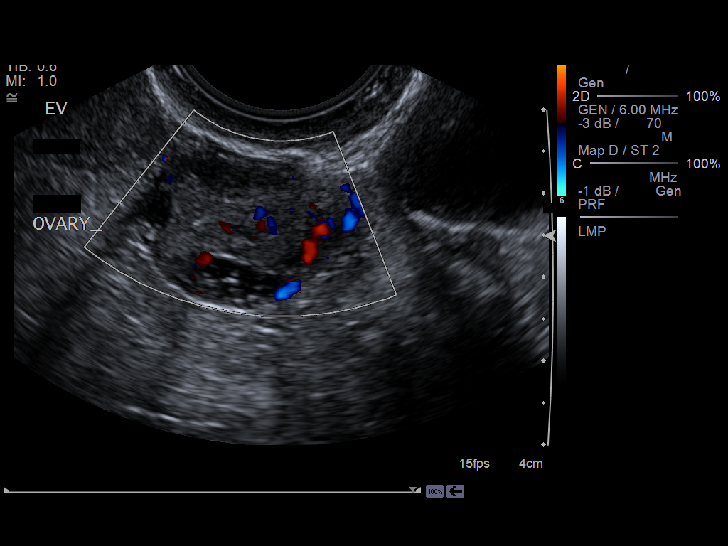
[im 36/58]
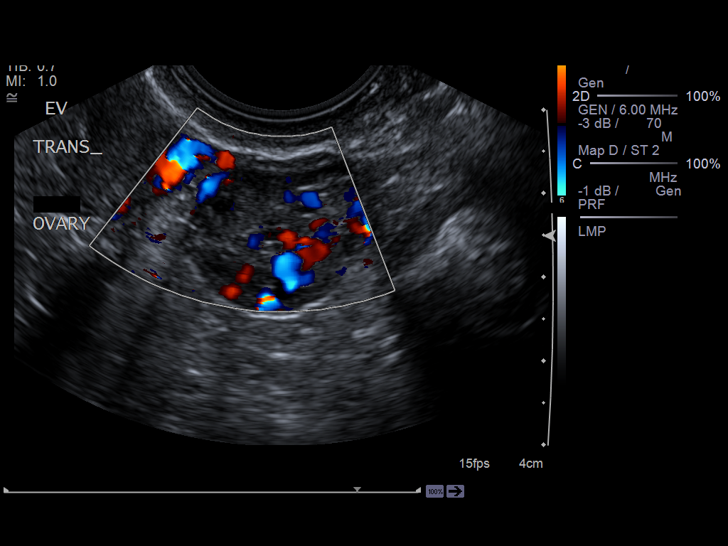
[im 39/58]
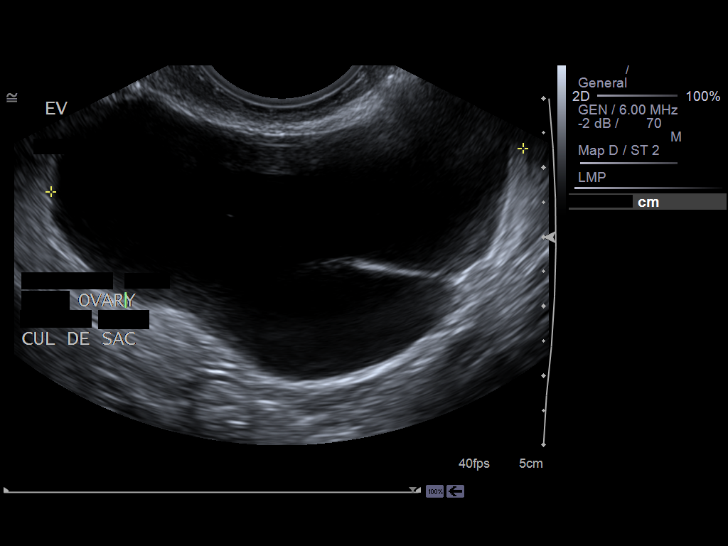
[im 43/58]
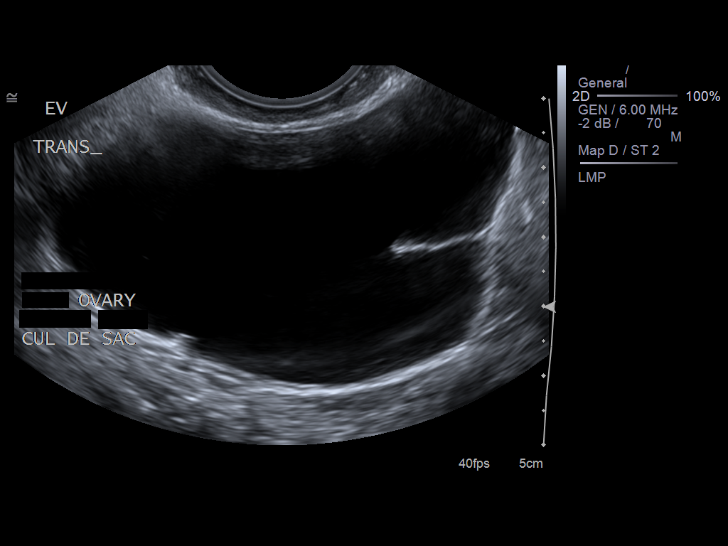
[im 48/58]
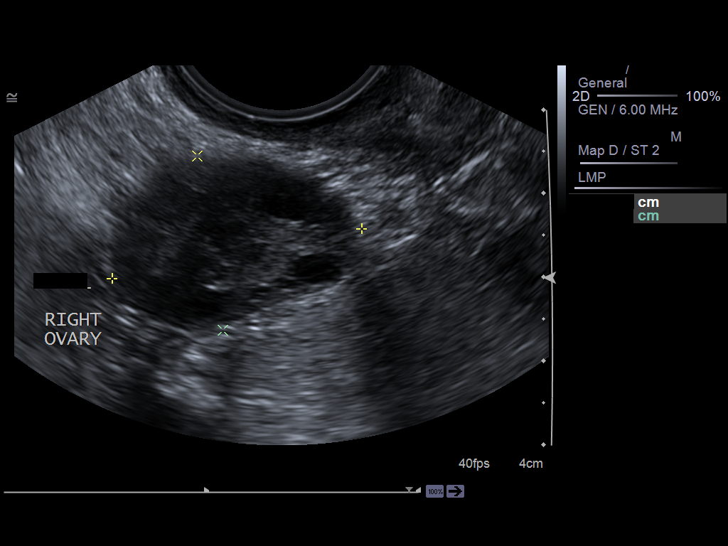
[im 53/58]
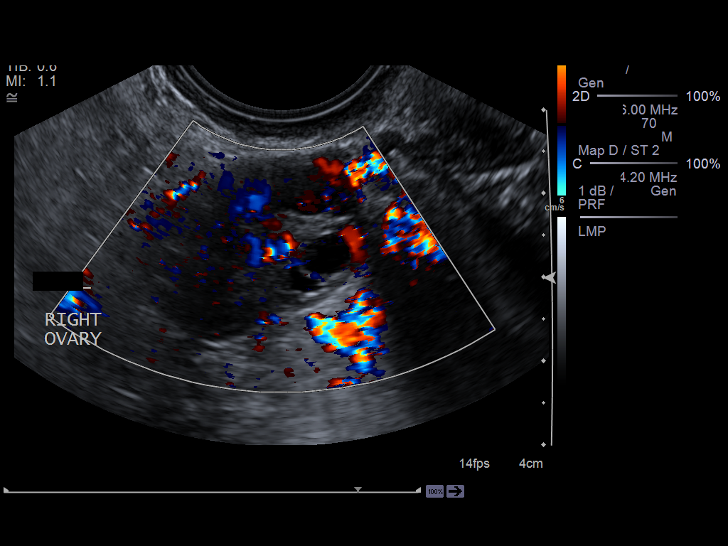
[im 58/58]
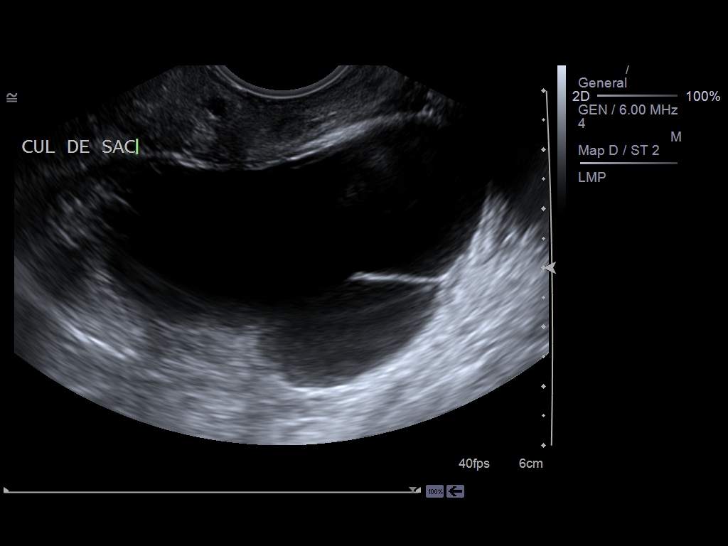

[14 of 25 positions shown; findings below may reference images not displayed]

PROCEDURE:     US  - US PELVIS EXAM W/TRANSVAGINAL  - October 10, 2011  [DATE]

RESULT:     Transabdominal and transvaginal imaging was performed. The
uterus is normal in echotexture and contour measures 5.3 x 2.9 x 3.7 cm. The
endometrial stripe is normal at 3.4 mm in thickness. The left ovary measures
2.7 x 1.9 x 2.3 centimeters and is associated with a partially septated
cystic structure which measures 6.7 x 3.6 x 6.2 cm. This corresponds to the
CT findings. The right ovary measures 3 x 2.1 x 2.2 cm.
IMPRESSION: 1. There is a septated cystic structure associated with the left ovary which
likely reflects an ovarian cyst. This will merit clinical correlation and
followup through gynecological consultation. Serial ultrasound examinations
available upon request.
2. The uterus and right adnexal structures are within the limits of normal.
No free fluid is demonstrated in the pelvis.

A preliminary report was sent to the [HOSPITAL] the conclusion
of the study.

## 2013-10-14 IMAGING — CT CT ABD-PELV W/O CM
1 of 2 series · 14 of 32 positions shown, 18 images · non-contrast
Comparison: none

REASON FOR EXAM: (1) periumbilical and epigastric pain with n/v.; (2) pain
COMMENTS:

[Series 2: 3mm soft tissue · axial · 0.81mm/px · z∈[-529,-88]mm · 14 of 162 slices shown, 18 images]
[im 8/162  soft-tissue]
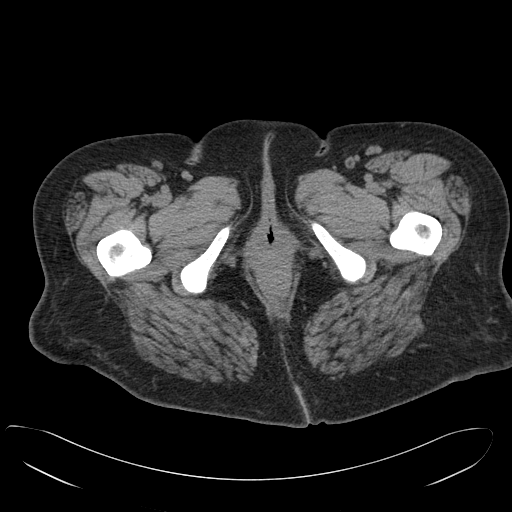
[im 8/162  bone]
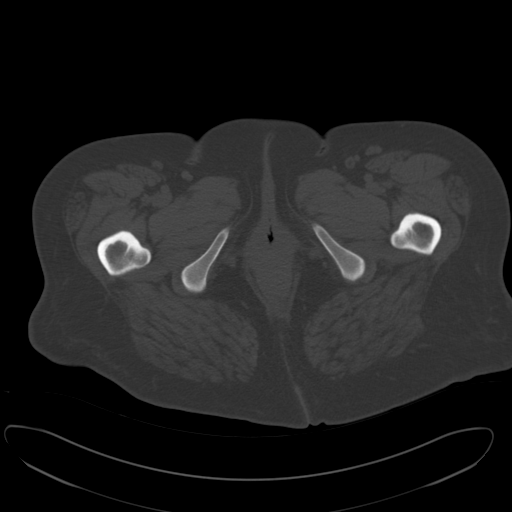
[im 22/162  soft-tissue]
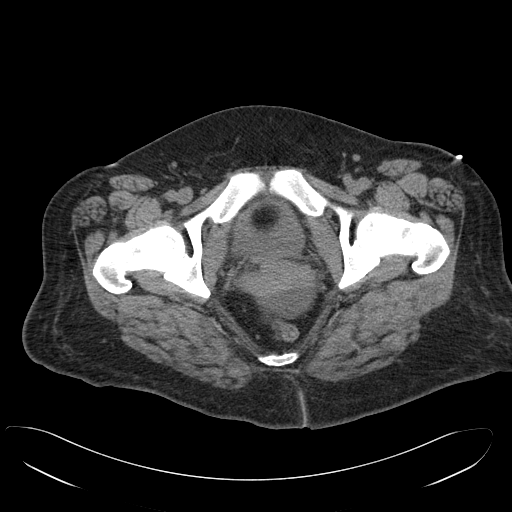
[im 36/162  soft-tissue]
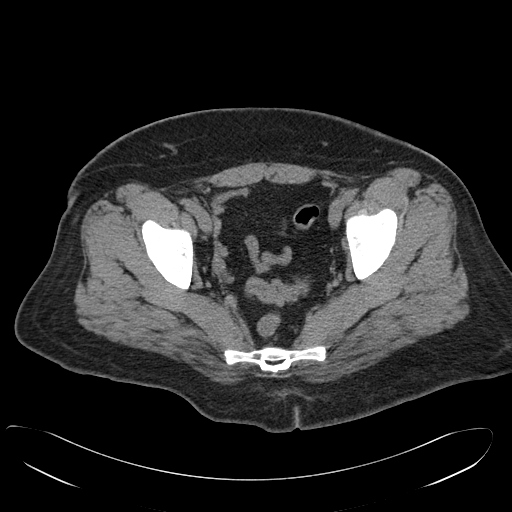
[im 50/162  soft-tissue]
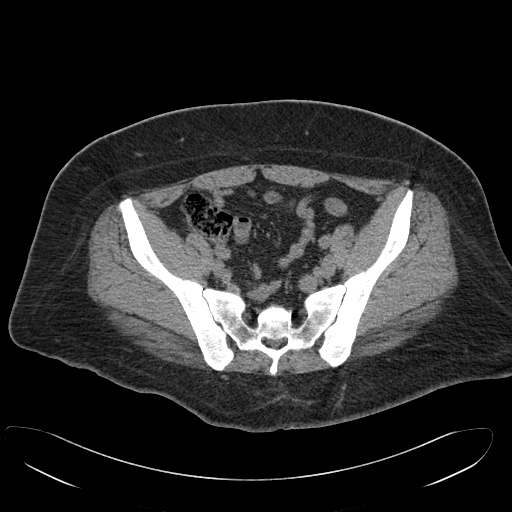
[im 64/162  soft-tissue]
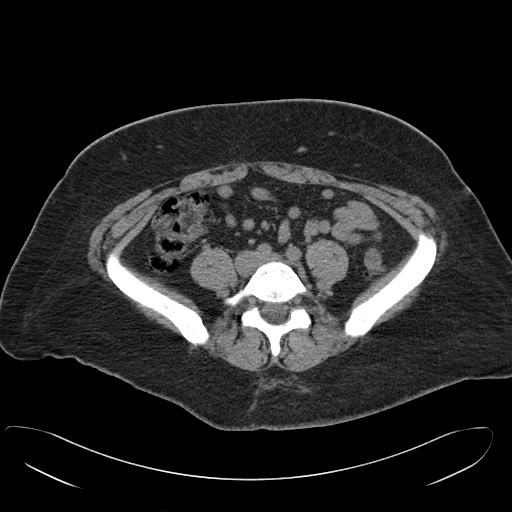
[im 78/162  soft-tissue]
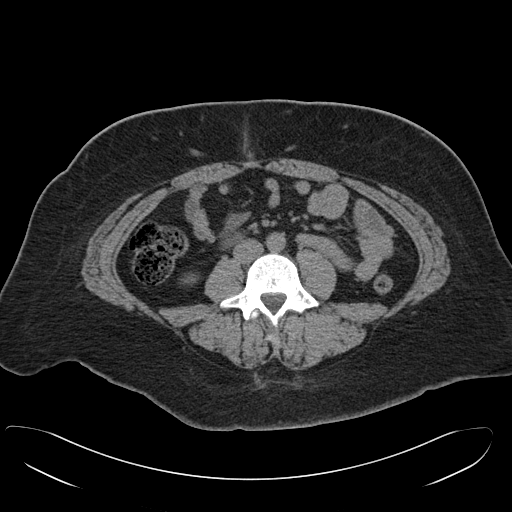
[im 85/162  soft-tissue]
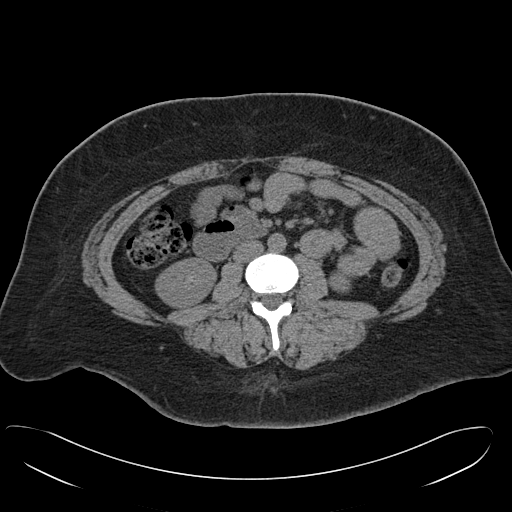
[im 99/162  soft-tissue]
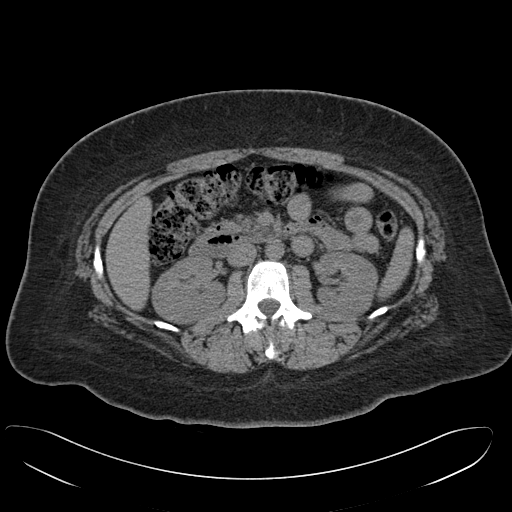
[im 113/162  soft-tissue]
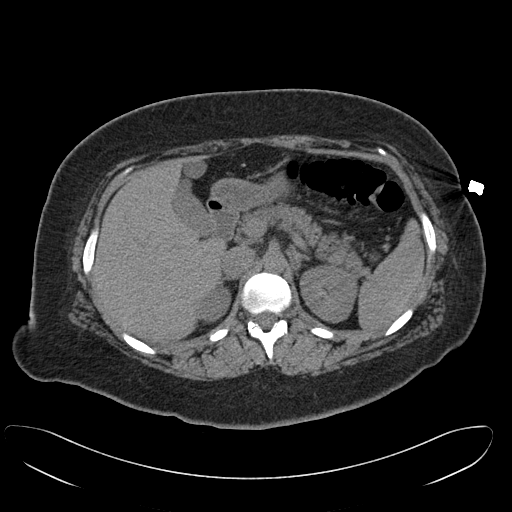
[im 113/162  bone]
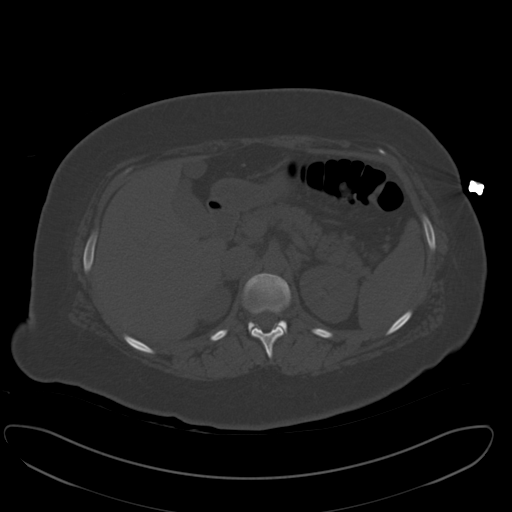
[im 127/162  soft-tissue]
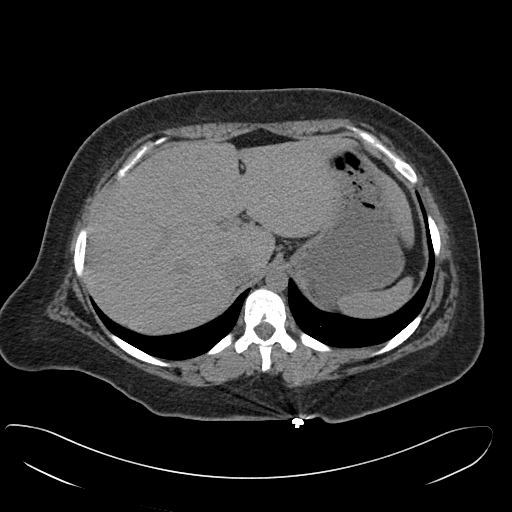
[im 134/162  lung]
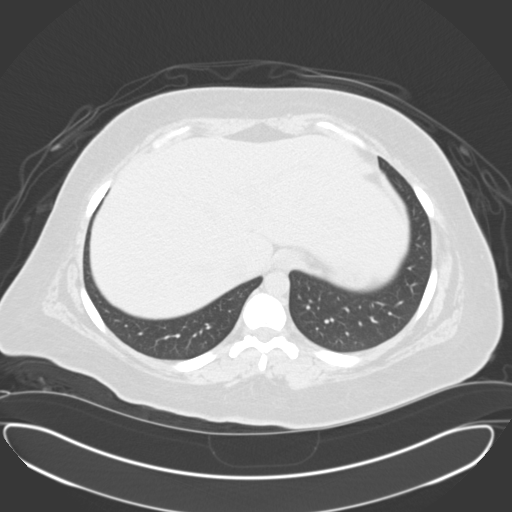
[im 141/162  soft-tissue]
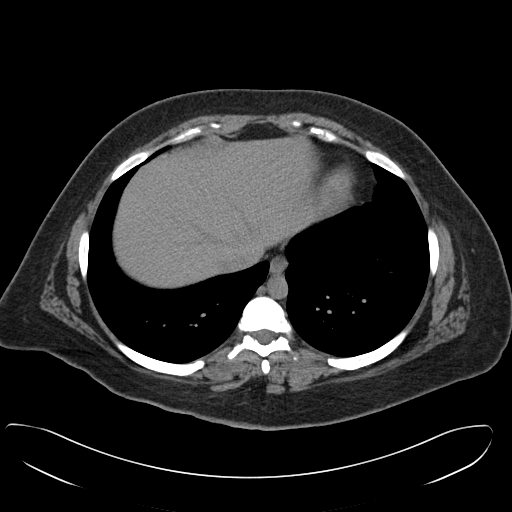
[im 141/162  lung]
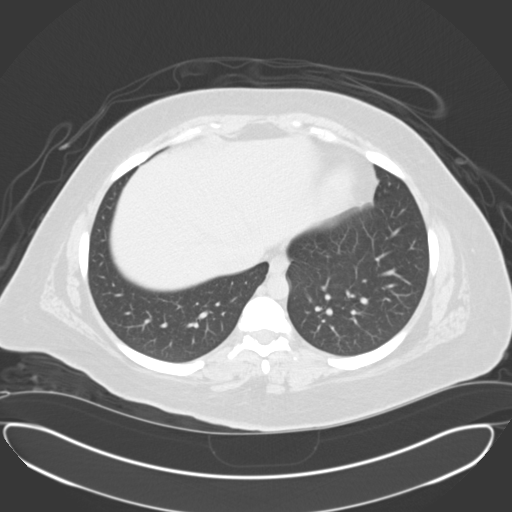
[im 148/162  lung]
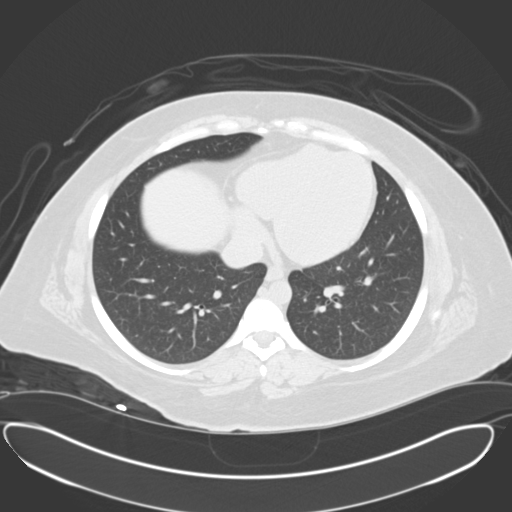
[im 155/162  soft-tissue]
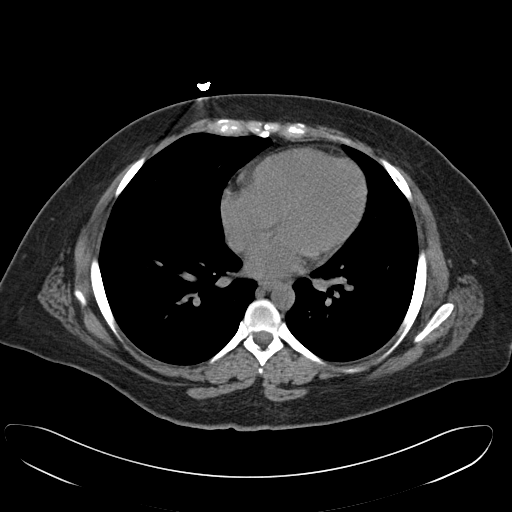
[im 155/162  lung]
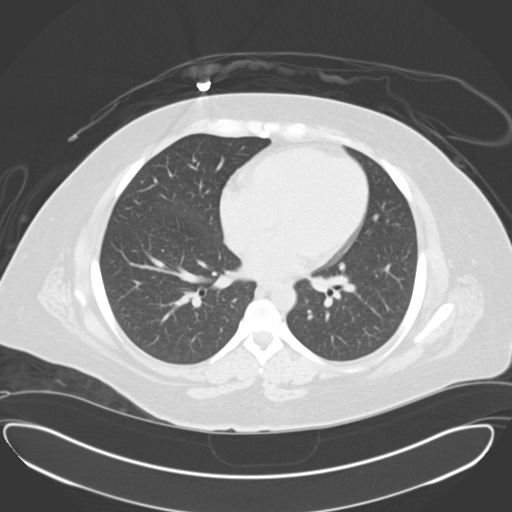

[14 of 32 positions shown; findings below may reference images not displayed]

PROCEDURE:     CT  - CT ABDOMEN AND PELVIS W[DATE]  [DATE]

RESULT:     Axial noncontrast CT scanning was performed through the abdomen
and pelvis with reconstructions at 3 mm intervals and slice thicknesses.
Review of multiplanar reconstructed images was performed separately on the
VIA monitor were

The kidneys exhibit normal density and contour. No calcified stones are
evident. The perinephric fat is normal in density. There is no evidence of
obstruction. Along the course of the ureters there are no abnormal
calcifications. The partially distended urinary bladder is normal in
appearance.

The unopacified loops of small and large bowel exhibit no evidence of ileus
nor of obstruction. There is no evidence of an inflamed appendix. The
pericecal soft tissues are normal in appearance.

The liver, gallbladder, pancreas, spleen, moderately distended stomach, end
adrenal glands are normal in appearance. The caliber of the abdominal aorta
is normal.

Within the pelvis there is fluid demonstrated in the cul-de-sac. This fluid
collection measures 7.1 cm transversely by 4.6 cm AP by 3.4 cm. This is
closely applied to the posterior aspect of the uterus but also there is to
be adjacent to or arising from the posterior aspect of the left ovary. It is
also closely applied to the distal sigmoid colon and rectum. The uterus is
normal in size.

There is no inguinal nor significant umbilical hernia. The lung bases are
clear. The lumbar vertebral bodies are preserved in height.
IMPRESSION: 1. There is a fluid collection in the pelvis which appears to be
encapsulated which may reflect a posteriorly located left ovarian cystic
process. Further evaluation with pelvic ultrasound would be useful. The
uterus and ovaries themselves exhibit no definite acute abnormality.
2. There is no abnormality of the small or large bowel.
3. There is no acute hepatobiliary abnormality nor acute urinary tract
abnormality.
4. There is no intra-abdominal nor pelvic lymphadenopathy.

A preliminary report was sent to the [HOSPITAL] the conclusion
of the study.

## 2013-10-18 DIAGNOSIS — O0990 Supervision of high risk pregnancy, unspecified, unspecified trimester: Secondary | ICD-10-CM | POA: Diagnosis not present

## 2013-10-18 DIAGNOSIS — Z6839 Body mass index (BMI) 39.0-39.9, adult: Secondary | ICD-10-CM | POA: Diagnosis not present

## 2013-10-18 DIAGNOSIS — Z3689 Encounter for other specified antenatal screening: Secondary | ICD-10-CM | POA: Diagnosis not present

## 2013-11-15 DIAGNOSIS — IMO0002 Reserved for concepts with insufficient information to code with codable children: Secondary | ICD-10-CM | POA: Diagnosis not present

## 2013-11-15 DIAGNOSIS — Z3689 Encounter for other specified antenatal screening: Secondary | ICD-10-CM | POA: Diagnosis not present

## 2013-11-29 DIAGNOSIS — F3110 Bipolar disorder, current episode manic without psychotic features, unspecified: Secondary | ICD-10-CM | POA: Diagnosis not present

## 2013-11-29 DIAGNOSIS — F4001 Agoraphobia with panic disorder: Secondary | ICD-10-CM | POA: Diagnosis not present

## 2013-11-29 DIAGNOSIS — F909 Attention-deficit hyperactivity disorder, unspecified type: Secondary | ICD-10-CM | POA: Diagnosis not present

## 2013-11-29 DIAGNOSIS — F411 Generalized anxiety disorder: Secondary | ICD-10-CM | POA: Diagnosis not present

## 2013-12-02 DIAGNOSIS — Z331 Pregnant state, incidental: Secondary | ICD-10-CM | POA: Diagnosis not present

## 2013-12-02 DIAGNOSIS — A630 Anogenital (venereal) warts: Secondary | ICD-10-CM | POA: Diagnosis not present

## 2013-12-02 DIAGNOSIS — Z3400 Encounter for supervision of normal first pregnancy, unspecified trimester: Secondary | ICD-10-CM | POA: Diagnosis not present

## 2013-12-02 DIAGNOSIS — F17200 Nicotine dependence, unspecified, uncomplicated: Secondary | ICD-10-CM | POA: Diagnosis not present

## 2013-12-15 DIAGNOSIS — Z331 Pregnant state, incidental: Secondary | ICD-10-CM | POA: Diagnosis not present

## 2013-12-15 DIAGNOSIS — O3660X0 Maternal care for excessive fetal growth, unspecified trimester, not applicable or unspecified: Secondary | ICD-10-CM | POA: Diagnosis not present

## 2013-12-29 DIAGNOSIS — R490 Dysphonia: Secondary | ICD-10-CM | POA: Diagnosis not present

## 2013-12-29 DIAGNOSIS — Z23 Encounter for immunization: Secondary | ICD-10-CM | POA: Diagnosis not present

## 2013-12-29 DIAGNOSIS — F17200 Nicotine dependence, unspecified, uncomplicated: Secondary | ICD-10-CM | POA: Diagnosis not present

## 2013-12-29 DIAGNOSIS — K219 Gastro-esophageal reflux disease without esophagitis: Secondary | ICD-10-CM | POA: Diagnosis not present

## 2014-01-08 ENCOUNTER — Observation Stay: Payer: Self-pay | Admitting: Obstetrics and Gynecology

## 2014-01-08 DIAGNOSIS — O368190 Decreased fetal movements, unspecified trimester, not applicable or unspecified: Secondary | ICD-10-CM | POA: Diagnosis not present

## 2014-01-08 DIAGNOSIS — Z3A38 38 weeks gestation of pregnancy: Secondary | ICD-10-CM | POA: Diagnosis not present

## 2014-01-11 DIAGNOSIS — O2600 Excessive weight gain in pregnancy, unspecified trimester: Secondary | ICD-10-CM | POA: Diagnosis not present

## 2014-01-11 DIAGNOSIS — Z6841 Body Mass Index (BMI) 40.0 and over, adult: Secondary | ICD-10-CM | POA: Diagnosis not present

## 2014-01-11 DIAGNOSIS — Z3A32 32 weeks gestation of pregnancy: Secondary | ICD-10-CM | POA: Diagnosis not present

## 2014-01-24 ENCOUNTER — Inpatient Hospital Stay: Payer: Self-pay

## 2014-01-24 DIAGNOSIS — I6200 Nontraumatic subdural hemorrhage, unspecified: Secondary | ICD-10-CM | POA: Diagnosis not present

## 2014-01-24 DIAGNOSIS — Z6841 Body Mass Index (BMI) 40.0 and over, adult: Secondary | ICD-10-CM | POA: Diagnosis not present

## 2014-01-24 DIAGNOSIS — Z3A34 34 weeks gestation of pregnancy: Secondary | ICD-10-CM | POA: Diagnosis present

## 2014-01-24 DIAGNOSIS — O98513 Other viral diseases complicating pregnancy, third trimester: Secondary | ICD-10-CM | POA: Diagnosis not present

## 2014-01-24 DIAGNOSIS — Z3A00 Weeks of gestation of pregnancy not specified: Secondary | ICD-10-CM | POA: Diagnosis not present

## 2014-01-24 DIAGNOSIS — O42913 Preterm premature rupture of membranes, unspecified as to length of time between rupture and onset of labor, third trimester: Secondary | ICD-10-CM | POA: Diagnosis not present

## 2014-01-24 DIAGNOSIS — E669 Obesity, unspecified: Secondary | ICD-10-CM | POA: Diagnosis present

## 2014-01-24 DIAGNOSIS — A630 Anogenital (venereal) warts: Secondary | ICD-10-CM | POA: Diagnosis present

## 2014-01-24 DIAGNOSIS — O42919 Preterm premature rupture of membranes, unspecified as to length of time between rupture and onset of labor, unspecified trimester: Secondary | ICD-10-CM | POA: Diagnosis present

## 2014-01-24 LAB — CBC WITH DIFFERENTIAL/PLATELET
Basophil #: 0.2 10*3/uL — ABNORMAL HIGH (ref 0.0–0.1)
Basophil %: 1.5 %
Eosinophil #: 0.1 10*3/uL (ref 0.0–0.7)
Eosinophil %: 0.8 %
HCT: 37.3 % (ref 35.0–47.0)
HGB: 12.3 g/dL (ref 12.0–16.0)
Lymphocyte #: 3.1 10*3/uL (ref 1.0–3.6)
Lymphocyte %: 19.2 %
MCH: 28 pg (ref 26.0–34.0)
MCHC: 32.9 g/dL (ref 32.0–36.0)
MCV: 85 fL (ref 80–100)
Monocyte #: 1.1 x10 3/mm — ABNORMAL HIGH (ref 0.2–0.9)
Monocyte %: 7 %
Neutrophil #: 11.7 10*3/uL — ABNORMAL HIGH (ref 1.4–6.5)
Neutrophil %: 71.5 %
Platelet: 337 10*3/uL (ref 150–440)
RBC: 4.39 10*6/uL (ref 3.80–5.20)
RDW: 13.3 % (ref 11.5–14.5)
WBC: 16.4 10*3/uL — ABNORMAL HIGH (ref 3.6–11.0)

## 2014-01-25 DIAGNOSIS — O42919 Preterm premature rupture of membranes, unspecified as to length of time between rupture and onset of labor, unspecified trimester: Secondary | ICD-10-CM

## 2014-01-26 LAB — HEMATOCRIT: HCT: 34.3 % — ABNORMAL LOW (ref 35.0–47.0)

## 2014-02-03 DIAGNOSIS — F3110 Bipolar disorder, current episode manic without psychotic features, unspecified: Secondary | ICD-10-CM | POA: Diagnosis not present

## 2014-02-03 DIAGNOSIS — F411 Generalized anxiety disorder: Secondary | ICD-10-CM | POA: Diagnosis not present

## 2014-02-03 DIAGNOSIS — F909 Attention-deficit hyperactivity disorder, unspecified type: Secondary | ICD-10-CM | POA: Diagnosis not present

## 2014-02-03 DIAGNOSIS — F4001 Agoraphobia with panic disorder: Secondary | ICD-10-CM | POA: Diagnosis not present

## 2014-02-08 DIAGNOSIS — J381 Polyp of vocal cord and larynx: Secondary | ICD-10-CM | POA: Diagnosis not present

## 2014-02-08 DIAGNOSIS — F17200 Nicotine dependence, unspecified, uncomplicated: Secondary | ICD-10-CM | POA: Diagnosis not present

## 2014-02-08 DIAGNOSIS — R490 Dysphonia: Secondary | ICD-10-CM | POA: Diagnosis not present

## 2014-02-08 DIAGNOSIS — F17210 Nicotine dependence, cigarettes, uncomplicated: Secondary | ICD-10-CM | POA: Diagnosis not present

## 2014-02-08 DIAGNOSIS — J387 Other diseases of larynx: Secondary | ICD-10-CM | POA: Diagnosis not present

## 2014-02-08 DIAGNOSIS — J370 Chronic laryngitis: Secondary | ICD-10-CM | POA: Diagnosis not present

## 2014-03-04 ENCOUNTER — Ambulatory Visit: Payer: Self-pay | Admitting: Unknown Physician Specialty

## 2014-03-04 DIAGNOSIS — J381 Polyp of vocal cord and larynx: Secondary | ICD-10-CM | POA: Diagnosis not present

## 2014-03-04 DIAGNOSIS — E669 Obesity, unspecified: Secondary | ICD-10-CM | POA: Diagnosis not present

## 2014-03-04 DIAGNOSIS — F17210 Nicotine dependence, cigarettes, uncomplicated: Secondary | ICD-10-CM | POA: Diagnosis not present

## 2014-03-04 DIAGNOSIS — K219 Gastro-esophageal reflux disease without esophagitis: Secondary | ICD-10-CM | POA: Diagnosis not present

## 2014-03-04 DIAGNOSIS — Z6841 Body Mass Index (BMI) 40.0 and over, adult: Secondary | ICD-10-CM | POA: Diagnosis not present

## 2014-07-25 DIAGNOSIS — F319 Bipolar disorder, unspecified: Secondary | ICD-10-CM | POA: Diagnosis not present

## 2014-07-25 DIAGNOSIS — F909 Attention-deficit hyperactivity disorder, unspecified type: Secondary | ICD-10-CM | POA: Diagnosis not present

## 2014-07-26 NOTE — H&P (Signed)
Subjective/Chief Complaint RUQ pain    History of Present Illness 1 day pain, mult prior episodes with FFI. no jaundice o ach stools no f/c    Past History brain hemorrhage/traumatic PSH salpingooophorectomy   Past Med/Surgical Hx:  Subdural Hematoma: No Surgery  Bipolar Disorder:   left fulopian tube removed:   M&T:   T&A:   Left hand surgery:   Tonsillectomy and Adenoidectomy:   ALLERGIES:  No Known Allergies:   Family and Social History:   Family History Non-Contributory    Social History positive  tobacco, positive  tobacco (Current within 1 year), negative ETOH, positive Illicit drugs    + Tobacco Current (within 1 year)    Place of Living Home   Review of Systems:   Fever/Chills No    Cough No    Abdominal Pain Yes    Diarrhea No    Constipation No    Nausea/Vomiting Yes    SOB/DOE No    Chest Pain No    Dysuria No    Tolerating Diet Yes  Nauseated  Vomiting   Physical Exam:   GEN no acute distress    HEENT pink conjunctivae    NECK supple    RESP normal resp effort  clear BS  no use of accessory muscles    CARD regular rate    ABD positive tenderness  soft    LYMPH negative neck, negative axillae    EXTR negative edema    SKIN normal to palpation, tattoos    PSYCH alert, A+O to time, place, person, poor insight   Lab Results: Hepatic:  18-Dec-13 10:40    Bilirubin, Total 0.5   Alkaline Phosphatase 86   SGPT (ALT) 16   SGOT (AST)  8   Total Protein, Serum 7.7   Albumin, Serum 4.0  Routine Chem:  18-Dec-13 10:40    Glucose, Serum  144   BUN 10   Creatinine (comp) 0.68   Sodium, Serum 138   Potassium, Serum  3.4   Chloride, Serum 107   CO2, Serum 23   Calcium (Total), Serum 8.8   Osmolality (calc) 277   eGFR (African American) >60   eGFR (Non-African American) >60 (eGFR values <27m/min/1.73 m2 may be an indication of chronic kidney disease (CKD). Calculated eGFR is useful in patients with stable renal  function. The eGFR calculation will not be reliable in acutely ill patients when serum creatinine is changing rapidly. It is not useful in  patients on dialysis. The eGFR calculation may not be applicable to patients at the low and high extremes of body sizes, pregnant women, and vegetarians.)   Anion Gap 8   Lipase  63 (Result(s) reported on 25 Mar 2012 at 11:09AM.)  Urine Drugs:  [id number]   Tricyclic Antidepressant, Ur Qual (comp) NEGATIVE (Result(s) reported on 25 Mar 2012 at 11:37AM.)   Amphetamines, Urine Qual. NEGATIVE   MDMA, Urine Qual. NEGATIVE   Cocaine Metabolite, Urine Qual. NEGATIVE   Opiate, Urine qual POSITIVE   Phencyclidine, Urine Qual. NEGATIVE   Cannabinoid, Urine Qual. POSITIVE   Barbiturates, Urine Qual. NEGATIVE   Benzodiazepine, Urine Qual. POSITIVE (----------------- The URINE DRUG SCREEN provides only a preliminary, unconfirmed analytical test result and should not be used for non-medical  purposes.  Clinical consideration and professional judgment should be  applied to any positive drug screen result due to possible interfering substances.  A more specific alternate chemical method must be used in order to obtain a confirmed  analytical result.  Gas chromatography/mass spectrometry (GC/MS) is the preferred confirmatory method.)   Methadone, Urine Qual. NEGATIVE  Routine UA:  18-Dec-13 11:08    Color (UA) Yellow   Clarity (UA) Hazy   Glucose (UA) Negative   Bilirubin (UA) Negative   Ketones (UA) 2+   Specific Gravity (UA) 1.014   Blood (UA) Negative   pH (UA) 9.0   Protein (UA) Negative   Nitrite (UA) Negative   Leukocyte Esterase (UA) Negative (Result(s) reported on 25 Mar 2012 at 11:38AM.)   RBC (UA) 1 /HPF   WBC (UA) 2 /HPF   Bacteria (UA) NONE SEEN   Epithelial Cells (UA) 7 /HPF   Mucous (UA) PRESENT (Result(s) reported on 25 Mar 2012 at 11:38AM.)  Routine Hem:  18-Dec-13 10:40    WBC (CBC)  12.6   RBC (CBC) 4.71   Hemoglobin (CBC)  13.9   Hematocrit (CBC) 41.6   Platelet Count (CBC) 217 (Result(s) reported on 25 Mar 2012 at 11:00AM.)   MCV 88   MCH 29.6   MCHC 33.5   RDW 13.6   Radiology Results: [place]:    18-Dec-13 12:44, US Abdomen Limited Survey   US Abdomen Limited Survey   REASON FOR EXAM:    abd pain  COMMENTS:   Body Site: Right Upper Quad    PROCEDURE: [place]  - US ABDOMEN LIMITED SURVEY  - Mar 25 2012 12:44PM     RESULT: A limited right upper quadrant ultrasound was performed.    The gallbladder is adequately distendedand demonstrates a positive   sonographic Murphy's sign and mild wall thickening to 3.2 mm. No stones   or sludge are demonstrated. The common bile duct is normal at 2.3 mm in   diameter. The observed portions of the liver appear normal. The pancreas  was obscured by bowel gas.    IMPRESSION:  There is a positive sonographic Murphy's sign and there is   gallbladder wall thickening suggesting acute cholecystitis. No gallstones     are demonstrated.     Dictation Site: 2        Verified By: DAVID A. Martinique, M.D., MD     Assessment/Admission Diagnosis ac acalc cholecystitis admit, HIDA hydrate   Electronic Signatures: Florene Glen (MD)  (Signed 18-Dec-13 15:58)  Authored: CHIEF COMPLAINT and HISTORY, PAST MEDICAL/SURGIAL HISTORY, ALLERGIES, FAMILY AND SOCIAL HISTORY, REVIEW OF SYSTEMS, PHYSICAL EXAM, LABS, Radiology, ASSESSMENT AND PLAN   Last Updated: 18-Dec-13 15:58 by Florene Glen (MD)
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
## 2014-07-26 NOTE — Op Note (Signed)
PATIENT NAME:  Suzanne Moses, Suzanne Moses MR#:  [id number] DATE OF BIRTH:  12/09/1985  DATE OF PROCEDURE:  12/13/2011  PREOPERATIVE DIAGNOSIS: Left ovarian cyst.   POSTOPERATIVE DIAGNOSIS: Left ovarian cyst.   PROCEDURE PERFORMED: Operative laparoscopy with left ovarian cystectomy and partial salpingectomy.   SURGEON: Glean Salen, MD   ASSISTANT: Dr. Glennon Mac    ANESTHESIA: General.   ESTIMATED BLOOD LOSS: Minimal.   COMPLICATIONS: None.   FINDINGS: Large bilobed left ovary with large ovarian cyst. Fallopian tube on the left side was attenuated and stretched with the cyst. Right tube and ovary and uterus were normal.   DISPOSITION: To recovery room in stable condition.   TECHNIQUE: The patient is prepped and draped in the usual sterile fashion after adequate anesthesia is obtained in the dorsal lithotomy position. Hulka tenaculum is placed on the cervix and the bladder is drained.   Attention is then turned to the abdomen where a Veress needle is inserted through a 5 mm infraumbilical incision after Marcaine is used to anesthetize the skin. Veress needle placement is confirmed using the hanging drop technique and the abdomen is then insufflated with CO2 gas. A 5 mm trocar is then inserted under direct visualization with the laparoscope with no injuries or bleeding noted. The patient is placed in Trendelenburg positioning. A 5 mm trocar is placed in the right lower quadrant and an 11 mm trocar is placed in the left lower quadrant lateral to the inferior epigastric blood vessels with no injuries or bleeding noted. The left ovary is observed as described above. It is stabilized and carefully dissected with the majority of the bilobed ovary with cyst excised away from the normal portion of the ovary. As the tube is stretched and attenuated, there is no way to preserve the left fallopian tube and it is transected in the ampullary portion of the tube. Excellent hemostasis is noted after it is completely  excised. It is placed in an Endopouch and removed through the left lower quadrant incision site. It is drained in the pouch so that it will collapse and be easily removed. Pelvic cavity is irrigated with aspiration of fluid. Excellent hemostasis is noted. Gas is expelled. Trocars are removed. The rectus fascia is closed with 0 Vicryl suture in the left lower quadrant and the skin is closed with Dermabond at all sites. Tenaculum is removed. The patient goes to the recovery room in stable condition. All sponge, instrument, and needle counts are correct.   ____________________________ R. Barnett Applebaum, MD rph:drc D: 12/13/2011 17:11:16 ET T: 12/14/2011 10:49:55 ET JOB#: [id number]  cc: Glean Salen, MD, <Dictator> Gae Dry MD ELECTRONICALLY SIGNED 12/16/2011 7:21

## 2014-08-01 LAB — SURGICAL PATHOLOGY
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
## 2014-09-05 ENCOUNTER — Other Ambulatory Visit: Payer: Self-pay | Admitting: Pain Medicine

## 2014-09-05 DIAGNOSIS — Z96641 Presence of right artificial hip joint: Secondary | ICD-10-CM

## 2014-09-05 DIAGNOSIS — M533 Sacrococcygeal disorders, not elsewhere classified: Secondary | ICD-10-CM | POA: Insufficient documentation

## 2014-09-05 DIAGNOSIS — M5136 Other intervertebral disc degeneration, lumbar region: Secondary | ICD-10-CM | POA: Insufficient documentation

## 2014-09-05 DIAGNOSIS — M51369 Other intervertebral disc degeneration, lumbar region without mention of lumbar back pain or lower extremity pain: Secondary | ICD-10-CM | POA: Insufficient documentation

## 2014-09-05 DIAGNOSIS — M170 Bilateral primary osteoarthritis of knee: Secondary | ICD-10-CM

## 2014-09-15 DIAGNOSIS — Z1389 Encounter for screening for other disorder: Secondary | ICD-10-CM | POA: Diagnosis not present

## 2014-09-15 DIAGNOSIS — L732 Hidradenitis suppurativa: Secondary | ICD-10-CM | POA: Diagnosis not present

## 2014-09-15 DIAGNOSIS — L309 Dermatitis, unspecified: Secondary | ICD-10-CM | POA: Diagnosis not present

## 2014-09-15 DIAGNOSIS — J069 Acute upper respiratory infection, unspecified: Secondary | ICD-10-CM | POA: Diagnosis not present

## 2014-10-24 DIAGNOSIS — F900 Attention-deficit hyperactivity disorder, predominantly inattentive type: Secondary | ICD-10-CM | POA: Diagnosis not present

## 2015-01-02 ENCOUNTER — Encounter: Payer: Self-pay | Admitting: Emergency Medicine

## 2015-01-02 ENCOUNTER — Emergency Department
Admission: EM | Admit: 2015-01-02 | Discharge: 2015-01-02 | Disposition: A | Discharge status: Home / Self Care | Payer: Medicare Other | Attending: Emergency Medicine | Admitting: Emergency Medicine

## 2015-01-02 DIAGNOSIS — L02415 Cutaneous abscess of right lower limb: Secondary | ICD-10-CM | POA: Diagnosis not present

## 2015-01-02 DIAGNOSIS — Z79899 Other long term (current) drug therapy: Secondary | ICD-10-CM | POA: Diagnosis not present

## 2015-01-02 DIAGNOSIS — Z720 Tobacco use: Secondary | ICD-10-CM | POA: Diagnosis not present

## 2015-01-02 MED ORDER — SULFAMETHOXAZOLE-TRIMETHOPRIM 800-160 MG PO TABS: 1.0000 | ORAL_TABLET | Freq: Two times a day (BID) | ORAL | Status: DC

## 2015-01-02 MED ORDER — OXYCODONE-ACETAMINOPHEN 5-325 MG PO TABS: 1.0000 | ORAL_TABLET | ORAL | Status: DC | PRN

## 2015-01-02 MED ORDER — LIDOCAINE-EPINEPHRINE 1 %-1:100000 IJ SOLN
30.0000 mL | Freq: Once | INTRAMUSCULAR | Status: DC
  Filled 2015-01-02: qty 30

## 2015-01-02 MED ORDER — LIDOCAINE-EPINEPHRINE (PF) 1 %-1:200000 IJ SOLN
INTRAMUSCULAR | Status: AC
  Filled 2015-01-02: qty 30

## 2015-01-02 MED ORDER — HYDROCODONE-ACETAMINOPHEN 5-325 MG PO TABS
2.0000 | ORAL_TABLET | Freq: Once | ORAL | Status: AC
  Administered 2015-01-02: 2 via ORAL
  Filled 2015-01-02: qty 2

## 2015-01-02 NOTE — ED Notes (Signed)
States she developed an abscess to right upper leg/groin area about 5 days

## 2015-01-02 NOTE — ED Provider Notes (Signed)
[facility] Emergency Department Provider Note  ____________________________________________  Time seen: Approximately 2:02 PM  I have reviewed the triage vital signs and the nursing notes.   HISTORY  Chief Complaint Abscess    HPI Suzanne Moses is a 29 y.o. female presenting with an abscess to her right inner thigh for the past 5 days. Patient states that area is painful 8/10 at rest and 7/10 when walking. Pain does not radiate. Area has grown in size and become more erythematous. The abscess has not drained. Patient has tried [facility] powder for the pain and an OTC ointment, neither of which helped her symptoms. Patient denies recurrent history of abscess. No recent antibiotic use. Patient denies fever, chills, chest pain, SOB, nausea, vomiting, diarrhea, and constipation.    History reviewed. No pertinent past medical history.  Patient Active Problem List   Diagnosis Date Noted  . DDD (degenerative disc disease), lumbar 09/05/2014  . History of total right hip replacement 09/05/2014  . Sacroiliac joint dysfunction 09/05/2014  . DJD (degenerative joint disease) of knee 09/05/2014    History reviewed. No pertinent past surgical history.  Current Outpatient Rx  Name  Route  Sig  Dispense  Refill  . ziprasidone (GEODON) 40 MG capsule   Oral   Take 40 mg by mouth 2 (two) times daily with a meal.         . oxyCODONE-acetaminophen (ROXICET) 5-325 MG per tablet   Oral   Take 1-2 tablets by mouth every 4 (four) hours as needed for severe pain.   15 tablet   0   . sulfamethoxazole-trimethoprim (BACTRIM DS,SEPTRA DS) 800-160 MG per tablet   Oral   Take 1 tablet by mouth 2 (two) times daily.   20 tablet   0     Allergies Review of patient's allergies indicates no known allergies.  No family history on file.  Social History Social History  Substance Use Topics  . Smoking status: Light Tobacco Smoker  . Smokeless tobacco: None  . Alcohol Use: No     Review of Systems Constitutional: No fever/chills Eyes: No visual changes. ENT: No sore throat. Cardiovascular: Denies chest pain. Respiratory: Denies shortness of breath. Gastrointestinal: No abdominal pain.  No nausea, no vomiting.  No diarrhea.  No constipation. Genitourinary: Negative for dysuria. Musculoskeletal: Negative for back pain. Skin: Negative for rash. Positive for abscess to right groin.  Neurological: Negative for headaches, focal weakness or numbness.  10-point ROS otherwise negative.  ____________________________________________   PHYSICAL EXAM:  VITAL SIGNS: ED Triage Vitals  Enc Vitals Group     BP 01/02/15 1327 148/85 mmHg     Pulse Rate 01/02/15 1327 90     Resp 01/02/15 1327 20     Temp 01/02/15 1327 98 F (36.7 C)     Temp Source 01/02/15 1327 Oral     SpO2 01/02/15 1327 98 %     Weight 01/02/15 1322 233 lb (105.688 kg)     Height 01/02/15 1322 5\' 3"  (1.6 m)     Head Cir --      Peak Flow --      Pain Score 01/02/15 1322 8     Pain Loc --      Pain Edu? --      Excl. in [place]? --     Constitutional: Alert and oriented. Well appearing and in no acute distress. Eyes: Conjunctivae are normal. PERRL. EOMI. Head: Atraumatic. Nose: No congestion/rhinnorhea. Mouth/Throat: Mucous membranes are moist.  Oropharynx  non-erythematous. Neck: No stridor.   Hematological/Lymphatic/Immunilogical: No cervical lymphadenopathy. Cardiovascular: Normal rate, regular rhythm. Grossly normal heart sounds.  Good peripheral circulation. Respiratory: Normal respiratory effort.  No retractions. Lungs CTAB. Gastrointestinal: Soft and nontender. No distention. No abdominal bruits. No CVA tenderness. Musculoskeletal: No lower extremity tenderness nor edema.  No joint effusions. Neurologic:  Normal speech and language. No gross focal neurologic deficits are appreciated. No gait instability. Skin:  Skin is warm, dry and intact. No rash noted. 2 cm abscess with 7  surrounding area of erythema to right inner thigh. Central fluctuance with no drainage noted.  Psychiatric: Mood and affect are normal. Speech and behavior are normal.  ____________________________________________   LABS (all labs ordered are listed, but only abnormal results are displayed)  Labs Reviewed - No data to display ____________________________________________  EKG  None ____________________________________________  RADIOLOGY  None ____________________________________________   PROCEDURES  Procedure(s) performed: I&D of abscess, see procedure note(s).  INCISION AND DRAINAGE Performed by: Arlyss Repress Consent: Verbal consent obtained. Risks and benefits: risks, benefits and alternatives were discussed Type: abscess, 2 cm with 7 cm area of erythema  Body area: right inner thigh  Anesthesia: local infiltration  Incision was made with a scalpel.  Local anesthetic: lidocaine 1% with epinephrine  Anesthetic total: 12 ml  Complexity: complex Blunt dissection to break up loculations  Drainage: purulent  Drainage amount: 15 cc  Packing material: 1/4 in iodoform gauze  Patient tolerance: Patient tolerated the procedure well with no immediate complications.     Critical Care performed: No  ____________________________________________   INITIAL IMPRESSION / ASSESSMENT AND PLAN / ED COURSE  Pertinent labs & imaging results that were available during my care of the patient were reviewed by me and considered in my medical decision making (see chart for details).  29 year old female presenting with an abscess to right inner thigh. Patient otherwise feeling healthy. Patient afebrile in the ED. I&D of abscess performed and packing material applied. Patient tolerated procedure well. Patient administered Norco #15 prn for pain and Bactrim x10 days.   Patient voices no other emergency medical complaints at this time. Patient to follow up in the ED in 2 days  for removal of packing material and to reassess the wound.  ____________________________________________   FINAL CLINICAL IMPRESSION(S) / ED DIAGNOSES  Final diagnoses:  Abscess of right thigh     Arlyss Repress, PA-C 01/02/15 1537  Delman Kitten, MD 01/02/15 [phone]

## 2015-01-02 NOTE — Discharge Instructions (Signed)
Abscess °An abscess is an infected area that contains a collection of pus and debris. It can occur in almost any part of the body. An abscess is also known as a furuncle or boil. °CAUSES  °An abscess occurs when tissue gets infected. This can occur from blockage of oil or sweat glands, infection of hair follicles, or a minor injury to the skin. As the body tries to fight the infection, pus collects in the area and creates pressure under the skin. This pressure causes pain. People with weakened immune systems have difficulty fighting infections and get certain abscesses more often.  °SYMPTOMS °Usually an abscess develops on the skin and becomes a painful mass that is red, warm, and tender. If the abscess forms under the skin, you may feel a moveable soft area under the skin. Some abscesses break open (rupture) on their own, but most will continue to get worse without care. The infection can spread deeper into the body and eventually into the bloodstream, causing you to feel ill.  °DIAGNOSIS  °Your caregiver will take your medical history and perform a physical exam. A sample of fluid may also be taken from the abscess to determine what is causing your infection. °TREATMENT  °Your caregiver may prescribe antibiotic medicines to fight the infection. However, taking antibiotics alone usually does not cure an abscess. Your caregiver may need to make a small cut (incision) in the abscess to drain the pus. In some cases, gauze is packed into the abscess to reduce pain and to continue draining the area. °HOME CARE INSTRUCTIONS  °· Only take over-the-counter or prescription medicines for pain, discomfort, or fever as directed by your caregiver. °· If you were prescribed antibiotics, take them as directed. Finish them even if you start to feel better. °· If gauze is used, follow your caregiver's directions for changing the gauze. °· To avoid spreading the infection: °· Keep your draining abscess covered with a  bandage. °· Wash your hands well. °· Do not share personal care items, towels, or whirlpools with others. °· Avoid skin contact with others. °· Keep your skin and clothes clean around the abscess. °· Keep all follow-up appointments as directed by your caregiver. °SEEK MEDICAL CARE IF:  °· You have increased pain, swelling, redness, fluid drainage, or bleeding. °· You have muscle aches, chills, or a general ill feeling. °· You have a fever. °MAKE SURE YOU:  °· Understand these instructions. °· Will watch your condition. °· Will get help right away if you are not doing well or get worse. °Document Released: 01/02/2005 Document Revised: 09/24/2011 Document Reviewed: 06/07/2011 °ExitCare® Patient Information ©2015 [employer]. This information is not intended to replace advice given to you by your health care provider. Make sure you discuss any questions you have with your health care provider. ° °Abscess °Care After °An abscess (also called a boil or furuncle) is an infected area that contains a collection of pus. Signs and symptoms of an abscess include pain, tenderness, redness, or hardness, or you may feel a moveable soft area under your skin. An abscess can occur anywhere in the body. The infection may spread to surrounding tissues causing cellulitis. A cut (incision) by the surgeon was made over your abscess and the pus was drained out. Gauze may have been packed into the space to provide a drain that will allow the cavity to heal from the inside outwards. The boil may be painful for 5 to 7 days. Most people with a boil do not have   high fevers. Your abscess, if seen early, may not have localized, and may not have been lanced. If not, another appointment may be required for this if it does not get better on its own or with medications. °HOME CARE INSTRUCTIONS  °· Only take over-the-counter or prescription medicines for pain, discomfort, or fever as directed by your caregiver. °· When you bathe, soak and then  remove gauze or iodoform packs at least daily or as directed by your caregiver. You may then wash the wound gently with mild soapy water. Repack with gauze or do as your caregiver directs. °SEEK IMMEDIATE MEDICAL CARE IF:  °· You develop increased pain, swelling, redness, drainage, or bleeding in the wound site. °· You develop signs of generalized infection including muscle aches, chills, fever, or a general ill feeling. °· An oral temperature above 102° F (38.9° C) develops, not controlled by medication. °See your caregiver for a recheck if you develop any of the symptoms described above. If medications (antibiotics) were prescribed, take them as directed. °Document Released: 10/11/2004 Document Revised: 06/17/2011 Document Reviewed: 06/08/2007 °ExitCare® Patient Information ©2015 [employer]. This information is not intended to replace advice given to you by your health care provider. Make sure you discuss any questions you have with your health care provider. ° °

## 2015-01-04 ENCOUNTER — Emergency Department
Admission: EM | Admit: 2015-01-04 | Discharge: 2015-01-04 | Disposition: A | Discharge status: Home / Self Care | Payer: Medicare Other | Attending: Emergency Medicine | Admitting: Emergency Medicine

## 2015-01-04 ENCOUNTER — Encounter: Payer: Self-pay | Admitting: Emergency Medicine

## 2015-01-04 DIAGNOSIS — Z720 Tobacco use: Secondary | ICD-10-CM | POA: Diagnosis not present

## 2015-01-04 DIAGNOSIS — Z5189 Encounter for other specified aftercare: Secondary | ICD-10-CM

## 2015-01-04 DIAGNOSIS — Z4801 Encounter for change or removal of surgical wound dressing: Secondary | ICD-10-CM | POA: Insufficient documentation

## 2015-01-04 DIAGNOSIS — Z79899 Other long term (current) drug therapy: Secondary | ICD-10-CM | POA: Insufficient documentation

## 2015-01-04 MED ORDER — TRAMADOL HCL 50 MG PO TABS: 50.0000 mg | ORAL_TABLET | Freq: Four times a day (QID) | ORAL | Status: DC | PRN

## 2015-01-04 NOTE — ED Notes (Signed)
Pt to ed with  C/o wound recheck. Pt states she was seen here 2 days ago for abscess and i and d with packing placed.

## 2015-01-04 NOTE — ED Provider Notes (Signed)
[facility] Emergency Department Provider Note  ____________________________________________  Time seen: Approximately 9:33 AM  I have reviewed the triage vital signs and the nursing notes.   HISTORY  Chief Complaint Abscess    HPI Suzanne Moses is a 29 y.o. female patient here today for wound check. Patient today status post I&D for abscess right medial thigh. Patient patient with complaint is moderate pain. He states she's been doing daily dressing changes directed and continue previous medications. Patient rates the pain as a 5/10.  History reviewed. No pertinent past medical history.  Patient Active Problem List   Diagnosis Date Noted  . DDD (degenerative disc disease), lumbar 09/05/2014  . History of total right hip replacement 09/05/2014  . Sacroiliac joint dysfunction 09/05/2014  . DJD (degenerative joint disease) of knee 09/05/2014    History reviewed. No pertinent past surgical history.  Current Outpatient Rx  Name  Route  Sig  Dispense  Refill  . oxyCODONE-acetaminophen (ROXICET) 5-325 MG per tablet   Oral   Take 1-2 tablets by mouth every 4 (four) hours as needed for severe pain.   15 tablet   0   . sulfamethoxazole-trimethoprim (BACTRIM DS,SEPTRA DS) 800-160 MG per tablet   Oral   Take 1 tablet by mouth 2 (two) times daily.   20 tablet   0   . ziprasidone (GEODON) 40 MG capsule   Oral   Take 40 mg by mouth 2 (two) times daily with a meal.           Allergies Review of patient's allergies indicates no known allergies.  History reviewed. No pertinent family history.  Social History Social History  Substance Use Topics  . Smoking status: Light Tobacco Smoker  . Smokeless tobacco: None  . Alcohol Use: No    Review of Systems Constitutional: No fever/chills Eyes: No visual changes. ENT: No sore throat. Cardiovascular: Denies chest pain. Respiratory: Denies shortness of breath. Gastrointestinal: No abdominal  pain.  No nausea, no vomiting.  No diarrhea.  No constipation. Genitourinary: Negative for dysuria. Musculoskeletal: Negative for back pain. Skin: Negative for rash. Neurological: Negative for headaches, focal weakness or numbness. 10-point ROS otherwise negative.  ____________________________________________   PHYSICAL EXAM:  VITAL SIGNS: ED Triage Vitals  Enc Vitals Group     BP 01/04/15 0905 129/76 mmHg     Pulse Rate 01/04/15 0905 93     Resp 01/04/15 0905 2     Temp 01/04/15 0905 98.2 F (36.8 C)     Temp Source 01/04/15 0905 Oral     SpO2 01/04/15 0905 99 %     Weight 01/04/15 0905 233 lb (105.688 kg)     Height 01/04/15 0905 5\' 3"  (1.6 m)     Head Cir --      Peak Flow --      Pain Score 01/04/15 0906 5     Pain Loc --      Pain Edu? --      Excl. in [place]? --     Constitutional: Alert and oriented. Well appearing and in no acute distress. Eyes: Conjunctivae are normal. PERRL. EOMI. Head: Atraumatic. Nose: No congestion/rhinnorhea. Mouth/Throat: Mucous membranes are moist.  Oropharynx non-erythematous. Neck: No stridor.   Cardiovascular: Normal rate, regular rhythm. Grossly normal heart sounds.  Good peripheral circulation. Respiratory: Normal respiratory effort.  No retractions. Lungs CTAB. Gastrointestinal: Soft and nontender. No distention. No abdominal bruits. No CVA tenderness. Musculoskeletal: No lower extremity tenderness nor edema.  No joint effusions.  Neurologic:  Normal speech and language. No gross focal neurologic deficits are appreciated. No gait instability. Skin:  Skin is warm, dry and intact. No rash noted. Mild erythema noticed discharge status post moving packing material. Area was flushed clear return. Psychiatric: Mood and affect are normal. Speech and behavior are normal.  ____________________________________________   LABS (all labs ordered are listed, but only abnormal results are displayed)  Labs Reviewed - No data to  display ____________________________________________  EKG   ____________________________________________  RADIOLOGY   ____________________________________________   PROCEDURES  Procedure(s) performed: None  Critical Care performed: No  ____________________________________________   INITIAL IMPRESSION / ASSESSMENT AND PLAN / ED COURSE  Pertinent labs & imaging results that were available during my care of the patient were reviewed by me and considered in my medical decision making (see chart for details).  Wound recheck status post I&D. Area shows no signs of increased erythema or edema. Area was irrigated with normal saline with clear return. Area was redressed and patient given advise him to continue wound care. Patient [facility]. ____________________________________________   FINAL CLINICAL IMPRESSION(S) / ED DIAGNOSES  Final diagnoses:  Wound check, abscess      Sable Feil, PA-C 01/04/15 [place], MD 01/04/15 [phone]

## 2015-01-23 DIAGNOSIS — F310 Bipolar disorder, current episode hypomanic: Secondary | ICD-10-CM | POA: Diagnosis not present

## 2015-01-27 DIAGNOSIS — F310 Bipolar disorder, current episode hypomanic: Secondary | ICD-10-CM | POA: Diagnosis not present

## 2015-01-31 DIAGNOSIS — L309 Dermatitis, unspecified: Secondary | ICD-10-CM | POA: Diagnosis not present

## 2015-02-20 DIAGNOSIS — Z3A08 8 weeks gestation of pregnancy: Secondary | ICD-10-CM | POA: Diagnosis not present

## 2015-02-20 DIAGNOSIS — E669 Obesity, unspecified: Secondary | ICD-10-CM | POA: Diagnosis not present

## 2015-02-20 DIAGNOSIS — O0991 Supervision of high risk pregnancy, unspecified, first trimester: Secondary | ICD-10-CM | POA: Diagnosis not present

## 2015-02-20 DIAGNOSIS — Z124 Encounter for screening for malignant neoplasm of cervix: Secondary | ICD-10-CM | POA: Diagnosis not present

## 2015-02-20 DIAGNOSIS — O99211 Obesity complicating pregnancy, first trimester: Secondary | ICD-10-CM | POA: Diagnosis not present

## 2015-02-20 DIAGNOSIS — O09211 Supervision of pregnancy with history of pre-term labor, first trimester: Secondary | ICD-10-CM | POA: Diagnosis not present

## 2015-02-20 DIAGNOSIS — Z113 Encounter for screening for infections with a predominantly sexual mode of transmission: Secondary | ICD-10-CM | POA: Diagnosis not present

## 2015-02-20 DIAGNOSIS — F17200 Nicotine dependence, unspecified, uncomplicated: Secondary | ICD-10-CM | POA: Diagnosis not present

## 2015-02-20 DIAGNOSIS — Z6841 Body Mass Index (BMI) 40.0 and over, adult: Secondary | ICD-10-CM | POA: Diagnosis not present

## 2015-02-20 DIAGNOSIS — Z87898 Personal history of other specified conditions: Secondary | ICD-10-CM | POA: Diagnosis not present

## 2015-02-20 LAB — HM PAP SMEAR: HM Pap smear: NEGATIVE

## 2015-02-22 DIAGNOSIS — Z3A01 Less than 8 weeks gestation of pregnancy: Secondary | ICD-10-CM | POA: Diagnosis not present

## 2015-02-22 DIAGNOSIS — Z36 Encounter for antenatal screening of mother: Secondary | ICD-10-CM | POA: Diagnosis not present

## 2015-02-22 DIAGNOSIS — F17200 Nicotine dependence, unspecified, uncomplicated: Secondary | ICD-10-CM | POA: Diagnosis not present

## 2015-02-22 DIAGNOSIS — O09211 Supervision of pregnancy with history of pre-term labor, first trimester: Secondary | ICD-10-CM | POA: Diagnosis not present

## 2015-03-03 ENCOUNTER — Encounter: Payer: Self-pay | Admitting: Emergency Medicine

## 2015-03-03 ENCOUNTER — Emergency Department
Admission: EM | Admit: 2015-03-03 | Discharge: 2015-03-03 | Discharge status: Against Medical Advice | Payer: Medicare Other | Attending: Emergency Medicine | Admitting: Emergency Medicine

## 2015-03-03 DIAGNOSIS — R197 Diarrhea, unspecified: Secondary | ICD-10-CM | POA: Diagnosis not present

## 2015-03-03 DIAGNOSIS — Z3A09 9 weeks gestation of pregnancy: Secondary | ICD-10-CM | POA: Insufficient documentation

## 2015-03-03 DIAGNOSIS — R1010 Upper abdominal pain, unspecified: Secondary | ICD-10-CM | POA: Diagnosis not present

## 2015-03-03 DIAGNOSIS — O26891 Other specified pregnancy related conditions, first trimester: Secondary | ICD-10-CM | POA: Diagnosis not present

## 2015-03-03 DIAGNOSIS — O9989 Other specified diseases and conditions complicating pregnancy, childbirth and the puerperium: Secondary | ICD-10-CM | POA: Insufficient documentation

## 2015-03-03 DIAGNOSIS — R1013 Epigastric pain: Secondary | ICD-10-CM | POA: Diagnosis not present

## 2015-03-03 DIAGNOSIS — O99331 Smoking (tobacco) complicating pregnancy, first trimester: Secondary | ICD-10-CM | POA: Insufficient documentation

## 2015-03-03 DIAGNOSIS — F17200 Nicotine dependence, unspecified, uncomplicated: Secondary | ICD-10-CM | POA: Diagnosis not present

## 2015-03-03 DIAGNOSIS — O219 Vomiting of pregnancy, unspecified: Secondary | ICD-10-CM | POA: Diagnosis not present

## 2015-03-03 DIAGNOSIS — O99111 Other diseases of the blood and blood-forming organs and certain disorders involving the immune mechanism complicating pregnancy, first trimester: Secondary | ICD-10-CM | POA: Diagnosis not present

## 2015-03-03 DIAGNOSIS — O210 Mild hyperemesis gravidarum: Secondary | ICD-10-CM | POA: Diagnosis present

## 2015-03-03 DIAGNOSIS — D72829 Elevated white blood cell count, unspecified: Secondary | ICD-10-CM | POA: Diagnosis not present

## 2015-03-03 LAB — COMPREHENSIVE METABOLIC PANEL
ALT: 21 U/L (ref 14–54)
AST: 20 U/L (ref 15–41)
Albumin: 3.9 g/dL (ref 3.5–5.0)
Alkaline Phosphatase: 69 U/L (ref 38–126)
Anion gap: 9 (ref 5–15)
BUN: 11 mg/dL (ref 6–20)
CO2: 22 mmol/L (ref 22–32)
Calcium: 9.1 mg/dL (ref 8.9–10.3)
Chloride: 106 mmol/L (ref 101–111)
Creatinine, Ser: 0.52 mg/dL (ref 0.44–1.00)
GFR calc Af Amer: >60 mL/min (ref >60)
GFR calc non Af Amer: >60 mL/min (ref >60)
Glucose, Bld: 128 mg/dL — ABNORMAL HIGH (ref 65–99)
Potassium: 3.9 mmol/L (ref 3.5–5.1)
Sodium: 137 mmol/L (ref 135–145)
Total Bilirubin: 0.4 mg/dL (ref 0.3–1.2)
Total Protein: 7.8 g/dL (ref 6.5–8.1)

## 2015-03-03 LAB — CBC
HCT: 42.7 % (ref 35.0–47.0)
Hemoglobin: 13.8 g/dL (ref 12.0–16.0)
MCH: 27.3 pg (ref 26.0–34.0)
MCHC: 32.4 g/dL (ref 32.0–36.0)
MCV: 84.3 fL (ref 80.0–100.0)
Platelets: 274 10*3/uL (ref 150–440)
RBC: 5.07 MIL/uL (ref 3.80–5.20)
RDW: 15.1 % — ABNORMAL HIGH (ref 11.5–14.5)
WBC: 13.3 10*3/uL — ABNORMAL HIGH (ref 3.6–11.0)

## 2015-03-03 LAB — HCG, QUANTITATIVE, PREGNANCY: hCG, Beta Chain, Quant, S: 98014 m[IU]/mL — ABNORMAL HIGH (ref <5)

## 2015-03-03 LAB — LIPASE, BLOOD: Lipase: 17 U/L (ref 11–51)

## 2015-03-03 NOTE — ED Notes (Signed)
Pt reports nausea and vomiting, pt is [redacted] weeks pregnant, reports upper and mid abdominal pain.

## 2015-03-05 DIAGNOSIS — O210 Mild hyperemesis gravidarum: Secondary | ICD-10-CM | POA: Diagnosis not present

## 2015-03-05 DIAGNOSIS — K828 Other specified diseases of gallbladder: Secondary | ICD-10-CM | POA: Diagnosis not present

## 2015-03-05 DIAGNOSIS — Z3A09 9 weeks gestation of pregnancy: Secondary | ICD-10-CM | POA: Diagnosis not present

## 2015-03-05 DIAGNOSIS — R1010 Upper abdominal pain, unspecified: Secondary | ICD-10-CM | POA: Diagnosis not present

## 2015-03-05 DIAGNOSIS — O26891 Other specified pregnancy related conditions, first trimester: Secondary | ICD-10-CM | POA: Diagnosis not present

## 2015-03-05 DIAGNOSIS — R1013 Epigastric pain: Secondary | ICD-10-CM | POA: Diagnosis not present

## 2015-03-09 ENCOUNTER — Encounter: Payer: Self-pay | Admitting: Emergency Medicine

## 2015-03-09 ENCOUNTER — Emergency Department
Admission: EM | Admit: 2015-03-09 | Discharge: 2015-03-09 | Disposition: A | Discharge status: Home / Self Care | Payer: Medicare Other | Attending: Emergency Medicine | Admitting: Emergency Medicine

## 2015-03-09 DIAGNOSIS — Z3A10 10 weeks gestation of pregnancy: Secondary | ICD-10-CM | POA: Insufficient documentation

## 2015-03-09 DIAGNOSIS — Z79899 Other long term (current) drug therapy: Secondary | ICD-10-CM | POA: Insufficient documentation

## 2015-03-09 DIAGNOSIS — O99331 Smoking (tobacco) complicating pregnancy, first trimester: Secondary | ICD-10-CM | POA: Insufficient documentation

## 2015-03-09 DIAGNOSIS — R1010 Upper abdominal pain, unspecified: Secondary | ICD-10-CM | POA: Diagnosis not present

## 2015-03-09 DIAGNOSIS — O9989 Other specified diseases and conditions complicating pregnancy, childbirth and the puerperium: Secondary | ICD-10-CM | POA: Insufficient documentation

## 2015-03-09 DIAGNOSIS — F17210 Nicotine dependence, cigarettes, uncomplicated: Secondary | ICD-10-CM | POA: Insufficient documentation

## 2015-03-09 DIAGNOSIS — Z792 Long term (current) use of antibiotics: Secondary | ICD-10-CM | POA: Diagnosis not present

## 2015-03-09 DIAGNOSIS — Z3490 Encounter for supervision of normal pregnancy, unspecified, unspecified trimester: Secondary | ICD-10-CM

## 2015-03-09 DIAGNOSIS — R112 Nausea with vomiting, unspecified: Secondary | ICD-10-CM

## 2015-03-09 DIAGNOSIS — R110 Nausea: Secondary | ICD-10-CM | POA: Diagnosis not present

## 2015-03-09 DIAGNOSIS — O210 Mild hyperemesis gravidarum: Secondary | ICD-10-CM | POA: Insufficient documentation

## 2015-03-09 LAB — CBC
HCT: 43.3 % (ref 35.0–47.0)
Hemoglobin: 14.5 g/dL (ref 12.0–16.0)
MCH: 27.9 pg (ref 26.0–34.0)
MCHC: 33.4 g/dL (ref 32.0–36.0)
MCV: 83.4 fL (ref 80.0–100.0)
Platelets: 271 10*3/uL (ref 150–440)
RBC: 5.19 MIL/uL (ref 3.80–5.20)
RDW: 14.7 % — ABNORMAL HIGH (ref 11.5–14.5)
WBC: 12 10*3/uL — ABNORMAL HIGH (ref 3.6–11.0)

## 2015-03-09 LAB — COMPREHENSIVE METABOLIC PANEL
ALT: 28 U/L (ref 14–54)
AST: 11 U/L — ABNORMAL LOW (ref 15–41)
Albumin: 3.9 g/dL (ref 3.5–5.0)
Alkaline Phosphatase: 60 U/L (ref 38–126)
Anion gap: 9 (ref 5–15)
BUN: 7 mg/dL (ref 6–20)
CO2: 25 mmol/L (ref 22–32)
Calcium: 8.8 mg/dL — ABNORMAL LOW (ref 8.9–10.3)
Chloride: 101 mmol/L (ref 101–111)
Creatinine, Ser: 0.52 mg/dL (ref 0.44–1.00)
GFR calc Af Amer: >60 mL/min (ref >60)
GFR calc non Af Amer: >60 mL/min (ref >60)
Glucose, Bld: 93 mg/dL (ref 65–99)
Potassium: 3.1 mmol/L — ABNORMAL LOW (ref 3.5–5.1)
Sodium: 135 mmol/L (ref 135–145)
Total Bilirubin: 0.2 mg/dL — ABNORMAL LOW (ref 0.3–1.2)
Total Protein: 7.4 g/dL (ref 6.5–8.1)

## 2015-03-09 LAB — HCG, QUANTITATIVE, PREGNANCY: hCG, Beta Chain, Quant, S: 74236 m[IU]/mL — ABNORMAL HIGH (ref <5)

## 2015-03-09 MED ORDER — PROMETHAZINE HCL 25 MG RE SUPP
RECTAL | Status: AC
  Administered 2015-03-09: 25 mg via RECTAL
  Filled 2015-03-09: qty 1

## 2015-03-09 MED ORDER — SUCRALFATE 1 G PO TABS: 1.0000 g | ORAL_TABLET | Freq: Four times a day (QID) | ORAL | Status: DC

## 2015-03-09 MED ORDER — PROMETHAZINE HCL 12.5 MG PO TABS: 12.5000 mg | ORAL_TABLET | Freq: Four times a day (QID) | ORAL | Status: DC | PRN

## 2015-03-09 MED ORDER — PROMETHAZINE HCL 25 MG/ML IJ SOLN
INTRAMUSCULAR | Status: AC
  Filled 2015-03-09: qty 1

## 2015-03-09 MED ORDER — SUCRALFATE 1 G PO TABS
1.0000 g | ORAL_TABLET | Freq: Once | ORAL | Status: AC
  Administered 2015-03-09: 1 g via ORAL
  Filled 2015-03-09: qty 1

## 2015-03-09 MED ORDER — POTASSIUM CHLORIDE CRYS ER 20 MEQ PO TBCR
30.0000 meq | EXTENDED_RELEASE_TABLET | Freq: Once | ORAL | Status: AC
  Administered 2015-03-09: 30 meq via ORAL

## 2015-03-09 MED ORDER — PROMETHAZINE HCL 25 MG PO TABS
25.0000 mg | ORAL_TABLET | Freq: Once | ORAL | Status: DC
  Filled 2015-03-09: qty 1

## 2015-03-09 MED ORDER — POTASSIUM CHLORIDE CRYS ER 20 MEQ PO TBCR
EXTENDED_RELEASE_TABLET | ORAL | Status: AC
  Administered 2015-03-09: 30 meq via ORAL
  Filled 2015-03-09: qty 2

## 2015-03-09 MED ORDER — PROMETHAZINE HCL 25 MG RE SUPP
25.0000 mg | RECTAL | Status: AC
  Administered 2015-03-09: 25 mg via RECTAL

## 2015-03-09 NOTE — ED Notes (Signed)
Pt states that she has experiencing nausea and vomiting X "a few days", less thank a week. Unable to afford phenergan prescription and allergic to zofran.

## 2015-03-09 NOTE — ED Notes (Signed)
Pt alert and oriented X4, active, cooperative, pt in NAD. RR even and unlabored, color WNL.  Pt informed to return if any life threatening symptoms occur.   

## 2015-03-09 NOTE — ED Notes (Signed)
Patient presents to the ED with centralized abdominal pain that is constant pain patient describes as, "sometimes it's burning, sometimes it's a knot, sometimes it's a wave."  Patient reports being about [redacted] weeks pregnant.  Patient reports vomiting about 6-7 times in the past 24 hours.  Patient states, "I went to [facility] and they gave me a prescription for phenergan but it's too expensive."  Patient reports having insurance but states the insurance was not fully covering the phenergan.  This is patient's second pregnancy.

## 2015-03-09 NOTE — ED Provider Notes (Signed)
[facility] Emergency Department Provider Note    ____________________________________________  Time seen: 1210  I have reviewed the triage vital signs and the nursing notes.   HISTORY  Chief Complaint Emesis During Pregnancy   History limited by: Not Limited   HPI Suzanne Moses is a 29 y.o. female who presents to the emergency department today at roughly [redacted] weeks pregnant because of concerns for nausea and vomiting. Patient states that is been getting worse past couple of days. She states she has had problems with nausea and vomiting so far in her pregnancy and has been seen at [facility] a couple times for this. The patient states she was given a prescription for Phenergan however was unable to afford it. She has also had some epigastric abdominal pain with these episodes of nausea and vomiting. Patient denies any fevers. Denies any problems with her first pregnancy.   History reviewed. No pertinent past medical history.  Patient Active Problem List   Diagnosis Date Noted  . DDD (degenerative disc disease), lumbar 09/05/2014  . History of total right hip replacement 09/05/2014  . Sacroiliac joint dysfunction 09/05/2014  . DJD (degenerative joint disease) of knee 09/05/2014    Past Surgical History  Procedure Laterality Date  . Tonsillectomy    . Ovary surgery      Current Outpatient Rx  Name  Route  Sig  Dispense  Refill  . oxyCODONE-acetaminophen (ROXICET) 5-325 MG per tablet   Oral   Take 1-2 tablets by mouth every 4 (four) hours as needed for severe pain.   15 tablet   0   . sulfamethoxazole-trimethoprim (BACTRIM DS,SEPTRA DS) 800-160 MG per tablet   Oral   Take 1 tablet by mouth 2 (two) times daily.   20 tablet   0   . traMADol (ULTRAM) 50 MG tablet   Oral   Take 1 tablet (50 mg total) by mouth every 6 (six) hours as needed for moderate pain.   12 tablet   0   . ziprasidone (GEODON) 40 MG capsule   Oral   Take 40 mg by mouth 2  (two) times daily with a meal.           Allergies Zofran  No family history on file.  Social History Social History  Substance Use Topics  . Smoking status: Current Every Day Smoker -- 1.00 packs/day    Types: Cigarettes  . Smokeless tobacco: None  . Alcohol Use: No    Review of Systems  Constitutional: Negative for fever. Cardiovascular: Negative for chest pain. Respiratory: Negative for shortness of breath. Gastrointestinal: Positive for upper abdominal pain, nausea and vomiting Genitourinary: Negative for dysuria. Musculoskeletal: Negative for back pain. Skin: Negative for rash. Neurological: Negative for headaches, focal weakness or numbness.  10-point ROS otherwise negative.  ____________________________________________   PHYSICAL EXAM:  VITAL SIGNS: ED Triage Vitals  Enc Vitals Group     BP 03/09/15 0948 144/91 mmHg     Pulse Rate 03/09/15 0948 68     Resp 03/09/15 0948 16     Temp 03/09/15 0948 98.2 F (36.8 C)     Temp Source 03/09/15 0948 Oral     SpO2 03/09/15 0948 98 %     Weight 03/09/15 0948 230 lb (104.327 kg)     Height 03/09/15 0948 5\' 2"  (1.575 m)     Head Cir --      Peak Flow --      Pain Score 03/09/15 0949 9  Constitutional: Alert and oriented. Well appearing and in no distress. Eyes: Conjunctivae are normal. PERRL. Normal extraocular movements. ENT   Head: Normocephalic and atraumatic.   Nose: No congestion/rhinnorhea.   Mouth/Throat: Mucous membranes are moist.   Neck: No stridor. Hematological/Lymphatic/Immunilogical: No cervical lymphadenopathy. Cardiovascular: Normal rate, regular rhythm.  No murmurs, rubs, or gallops. Respiratory: Normal respiratory effort without tachypnea nor retractions. Breath sounds are clear and equal bilaterally. No wheezes/rales/rhonchi. Gastrointestinal: Soft and nontender. No distention.  Genitourinary: Deferred Musculoskeletal: Normal range of motion in all extremities. No joint  effusions.  No lower extremity tenderness nor edema. Neurologic:  Normal speech and language. No gross focal neurologic deficits are appreciated.  Skin:  Skin is warm, dry and intact. No rash noted. Psychiatric: Mood and affect are normal. Speech and behavior are normal. Patient exhibits appropriate insight and judgment.  ____________________________________________    LABS (pertinent positives/negatives)  Labs Reviewed  COMPREHENSIVE METABOLIC PANEL - Abnormal; Notable for the following:    Potassium 3.1 (*)    Calcium 8.8 (*)    AST 11 (*)    Total Bilirubin 0.2 (*)    All other components within normal limits  CBC - Abnormal; Notable for the following:    WBC 12.0 (*)    RDW 14.7 (*)    All other components within normal limits  HCG, QUANTITATIVE, PREGNANCY - Abnormal; Notable for the following:    hCG, Beta Chain, Quant, S 74236 (*)    All other components within normal limits  URINALYSIS COMPLETEWITH MICROSCOPIC ([facility] ONLY)     ____________________________________________   EKG  None  ____________________________________________    RADIOLOGY  None   ____________________________________________   PROCEDURES  Procedure(s) performed: None  Critical Care performed: No  ____________________________________________   INITIAL IMPRESSION / ASSESSMENT AND PLAN / ED COURSE  Pertinent labs & imaging results that were available during my care of the patient were reviewed by me and considered in my medical decision making (see chart for details).  Patient presented to the emergency department today because of concerns for nausea and vomiting roughly [redacted] weeks pregnant. Patient's abdominal exam is benign. Patient without any vomiting during my exam. Will give patient Phenergan. Will give patient prescription for Phenergan as well as sucralfate. Advised patient to follow up with her OB/GYN doctor.  ____________________________________________   FINAL CLINICAL  IMPRESSION(S) / ED DIAGNOSES  Final diagnoses:  Pregnancy  Non-intractable vomiting with nausea, vomiting of unspecified type     Nance Pear, MD 03/09/15 1258

## 2015-03-09 NOTE — Discharge Instructions (Signed)
Please seek medical attention for any high fevers, chest pain, shortness of breath, change in behavior, persistent vomiting, bloody stool or any other new or concerning symptoms.  Hyperemesis Gravidarum Hyperemesis gravidarum is a severe form of nausea and vomiting that happens during pregnancy. Hyperemesis is worse than morning sickness. It may cause you to have nausea or vomiting all day for many days. It may keep you from eating and drinking enough food and liquids. Hyperemesis usually occurs during the first half (the first 20 weeks) of pregnancy. It often goes away once a woman is in her second half of pregnancy. However, sometimes hyperemesis continues through an entire pregnancy.  CAUSES  The cause of this condition is not completely known but is thought to be related to changes in the body's hormones when pregnant. It could be from the high level of the pregnancy hormone or an increase in estrogen in the body.  SIGNS AND SYMPTOMS   Severe nausea and vomiting.  Nausea that does not go away.  Vomiting that does not allow you to keep any food down.  Weight loss and body fluid loss (dehydration).  Having no desire to eat or not liking food you have previously enjoyed. DIAGNOSIS  Your health care provider will do a physical exam and ask you about your symptoms. He or she may also order blood tests and urine tests to make sure something else is not causing the problem.  TREATMENT  You may only need medicine to control the problem. If medicines do not control the nausea and vomiting, you will be treated in the hospital to prevent dehydration, increased acid in the blood (acidosis), weight loss, and changes in the electrolytes in your body that may harm the unborn baby (fetus). You may need IV fluids.  HOME CARE INSTRUCTIONS   Only take over-the-counter or prescription medicines as directed by your health care provider.  Try eating a couple of dry crackers or toast in the morning before  getting out of bed.  Avoid foods and smells that upset your stomach.  Avoid fatty and spicy foods.  Eat 5-6 small meals a day.  Do not drink when eating meals. Drink between meals.  For snacks, eat high-protein foods, such as cheese.  Eat or suck on things that have ginger in them. Ginger helps nausea.  Avoid food preparation. The smell of food can spoil your appetite.  Avoid iron pills and iron in your multivitamins until after 3-4 months of being pregnant. However, consult with your health care provider before stopping any prescribed iron pills. SEEK MEDICAL CARE IF:   Your abdominal pain increases.  You have a severe headache.  You have vision problems.  You are losing weight. SEEK IMMEDIATE MEDICAL CARE IF:   You are unable to keep fluids down.  You vomit blood.  You have constant nausea and vomiting.  You have excessive weakness.  You have extreme thirst.  You have dizziness or fainting.  You have a fever or persistent symptoms for more than 2-3 days.  You have a fever and your symptoms suddenly get worse. MAKE SURE YOU:   Understand these instructions.  Will watch your condition.  Will get help right away if you are not doing well or get worse.   This information is not intended to replace advice given to you by your health care provider. Make sure you discuss any questions you have with your health care provider.   Document Released: 03/25/2005 Document Revised: 01/13/2013 Document Reviewed: 11/04/2012 Elsevier  Interactive Patient Education ©2016 [employer] Inc. ° °

## 2015-04-03 DIAGNOSIS — F17200 Nicotine dependence, unspecified, uncomplicated: Secondary | ICD-10-CM | POA: Diagnosis not present

## 2015-04-03 DIAGNOSIS — Z36 Encounter for antenatal screening of mother: Secondary | ICD-10-CM | POA: Diagnosis not present

## 2015-04-03 DIAGNOSIS — O09299 Supervision of pregnancy with other poor reproductive or obstetric history, unspecified trimester: Secondary | ICD-10-CM | POA: Diagnosis not present

## 2015-04-03 DIAGNOSIS — Z3A12 12 weeks gestation of pregnancy: Secondary | ICD-10-CM | POA: Diagnosis not present

## 2015-04-09 NOTE — L&D Delivery Note (Signed)
Delivery Note At 12:14 AM a viable female infant was delivered via Vaginal, Spontaneous Delivery (Presentation: Left Occiput Anterior).  APGAR: 8, 9; weight  .   Placenta status: Intact, Spontaneous.  Cord: 3 vessels with the following complications: None.  Cord pH: N/A  Anesthesia: Epidural  Episiotomy: None Lacerations: None Suture Repair: None Est. Blood Loss (mL): 350  Mom to postpartum.  Baby to Couplet care / Skin to Skin.  Called to see patient.  Mom pushed to deliver viable female infant.  There was a shoulder dystocia determined by the fact that the shoulders were not delivering with the usual, gentle traction.  McRoberts maneuver employed with easy delivery of the shoulders with no additional maneuvers.  A single nuchal cord noted and easily reduced.  Baby to mom's chest.  Cord clamped and cut after > 1 min delay.  Cord blood obtained.  Placenta delivered spontaneously, intact, with a 3-vessel cord.  The perineum, vagina, and cervix inspected with no lacerations noted.  All counts correct.  Hemostasis obtained with IV pitocin and fundal massage. EBL 350 mL.    Will Bonnet, MD 09/20/2015, 12:32 AM

## 2015-04-24 DIAGNOSIS — F4310 Post-traumatic stress disorder, unspecified: Secondary | ICD-10-CM | POA: Diagnosis not present

## 2015-04-24 DIAGNOSIS — F310 Bipolar disorder, current episode hypomanic: Secondary | ICD-10-CM | POA: Diagnosis not present

## 2015-05-08 DIAGNOSIS — O09212 Supervision of pregnancy with history of pre-term labor, second trimester: Secondary | ICD-10-CM | POA: Diagnosis not present

## 2015-05-08 DIAGNOSIS — Z3A17 17 weeks gestation of pregnancy: Secondary | ICD-10-CM | POA: Diagnosis not present

## 2015-05-08 DIAGNOSIS — O0992 Supervision of high risk pregnancy, unspecified, second trimester: Secondary | ICD-10-CM | POA: Diagnosis not present

## 2015-05-22 DIAGNOSIS — Z3A19 19 weeks gestation of pregnancy: Secondary | ICD-10-CM | POA: Diagnosis not present

## 2015-05-22 DIAGNOSIS — Z6841 Body Mass Index (BMI) 40.0 and over, adult: Secondary | ICD-10-CM | POA: Diagnosis not present

## 2015-05-22 DIAGNOSIS — Z36 Encounter for antenatal screening of mother: Secondary | ICD-10-CM | POA: Diagnosis not present

## 2015-05-22 DIAGNOSIS — O09212 Supervision of pregnancy with history of pre-term labor, second trimester: Secondary | ICD-10-CM | POA: Diagnosis not present

## 2015-05-24 ENCOUNTER — Observation Stay
Admit: 2015-05-24 | Discharge: 2015-05-24 | Disposition: A | Discharge status: Home / Self Care | Payer: Medicare Other | Attending: Obstetrics and Gynecology | Admitting: Obstetrics and Gynecology

## 2015-05-24 ENCOUNTER — Encounter: Payer: Self-pay | Admitting: Obstetrics and Gynecology

## 2015-05-24 DIAGNOSIS — Z96641 Presence of right artificial hip joint: Secondary | ICD-10-CM | POA: Insufficient documentation

## 2015-05-24 DIAGNOSIS — Z3490 Encounter for supervision of normal pregnancy, unspecified, unspecified trimester: Secondary | ICD-10-CM

## 2015-05-24 DIAGNOSIS — O99322 Drug use complicating pregnancy, second trimester: Secondary | ICD-10-CM | POA: Diagnosis present

## 2015-05-24 DIAGNOSIS — O26892 Other specified pregnancy related conditions, second trimester: Secondary | ICD-10-CM | POA: Insufficient documentation

## 2015-05-24 DIAGNOSIS — O219 Vomiting of pregnancy, unspecified: Secondary | ICD-10-CM

## 2015-05-24 DIAGNOSIS — R112 Nausea with vomiting, unspecified: Secondary | ICD-10-CM | POA: Insufficient documentation

## 2015-05-24 DIAGNOSIS — E860 Dehydration: Secondary | ICD-10-CM | POA: Diagnosis not present

## 2015-05-24 DIAGNOSIS — F1910 Other psychoactive substance abuse, uncomplicated: Secondary | ICD-10-CM | POA: Diagnosis not present

## 2015-05-24 DIAGNOSIS — R1084 Generalized abdominal pain: Secondary | ICD-10-CM | POA: Diagnosis not present

## 2015-05-24 DIAGNOSIS — Z3A00 Weeks of gestation of pregnancy not specified: Secondary | ICD-10-CM | POA: Insufficient documentation

## 2015-05-24 HISTORY — DX: Vomiting of pregnancy, unspecified: O21.9

## 2015-05-24 HISTORY — DX: Encounter for supervision of normal pregnancy, unspecified, unspecified trimester: Z34.90

## 2015-05-24 HISTORY — DX: Other psychoactive substance abuse, uncomplicated: F19.10

## 2015-05-24 HISTORY — DX: Patient's noncompliance with other medical treatment and regimen due to unspecified reason: Z91.199

## 2015-05-24 HISTORY — DX: Procedure and treatment not carried out because of patient's decision for other reasons: Z53.29

## 2015-05-24 LAB — COMPREHENSIVE METABOLIC PANEL
ALT: 18 U/L (ref 14–54)
AST: 17 U/L (ref 15–41)
Albumin: 3.2 g/dL — ABNORMAL LOW (ref 3.5–5.0)
Alkaline Phosphatase: 92 U/L (ref 38–126)
Anion gap: 9 (ref 5–15)
BUN: 11 mg/dL (ref 6–20)
CO2: 23 mmol/L (ref 22–32)
Calcium: 8.5 mg/dL — ABNORMAL LOW (ref 8.9–10.3)
Chloride: 105 mmol/L (ref 101–111)
Creatinine, Ser: 0.48 mg/dL (ref 0.44–1.00)
GFR calc Af Amer: >60 mL/min (ref >60)
GFR calc non Af Amer: >60 mL/min (ref >60)
Glucose, Bld: 147 mg/dL — ABNORMAL HIGH (ref 65–99)
Potassium: 3.5 mmol/L (ref 3.5–5.1)
Sodium: 137 mmol/L (ref 135–145)
Total Bilirubin: 0.6 mg/dL (ref 0.3–1.2)
Total Protein: 7.3 g/dL (ref 6.5–8.1)

## 2015-05-24 LAB — URINE DRUG SCREEN, QUALITATIVE (ARMC ONLY)
Amphetamines, Ur Screen: NOT DETECTED
Barbiturates, Ur Screen: NOT DETECTED
Benzodiazepine, Ur Scrn: POSITIVE — AB
Cannabinoid 50 Ng, Ur ~~LOC~~: POSITIVE — AB
Cocaine Metabolite,Ur ~~LOC~~: NOT DETECTED
MDMA (Ecstasy)Ur Screen: NOT DETECTED
Methadone Scn, Ur: POSITIVE — AB
Opiate, Ur Screen: NOT DETECTED
Phencyclidine (PCP) Ur S: NOT DETECTED
Tricyclic, Ur Screen: NOT DETECTED

## 2015-05-24 LAB — URINALYSIS COMPLETE WITH MICROSCOPIC (ARMC ONLY)
Bacteria, UA: NONE SEEN
Bilirubin Urine: NEGATIVE
Glucose, UA: 50 mg/dL — AB
Hgb urine dipstick: NEGATIVE
Leukocytes, UA: NEGATIVE
Nitrite: NEGATIVE
Protein, ur: 30 mg/dL — AB
Specific Gravity, Urine: 1.032 — ABNORMAL HIGH (ref 1.005–1.030)
pH: 5 (ref 5.0–8.0)

## 2015-05-24 LAB — AMYLASE: Amylase: 26 U/L — ABNORMAL LOW (ref 28–100)

## 2015-05-24 LAB — CBC
HCT: 37.1 % (ref 35.0–47.0)
Hemoglobin: 12.5 g/dL (ref 12.0–16.0)
MCH: 28 pg (ref 26.0–34.0)
MCHC: 33.8 g/dL (ref 32.0–36.0)
MCV: 82.8 fL (ref 80.0–100.0)
Platelets: 263 10*3/uL (ref 150–440)
RBC: 4.48 MIL/uL (ref 3.80–5.20)
RDW: 14.2 % (ref 11.5–14.5)
WBC: 17.8 10*3/uL — ABNORMAL HIGH (ref 3.6–11.0)

## 2015-05-24 LAB — LIPASE, BLOOD: Lipase: 16 U/L (ref 11–51)

## 2015-05-24 MED ORDER — LACTATED RINGERS IV BOLUS (SEPSIS)
1500.0000 mL | Freq: Once | INTRAVENOUS | Status: AC
Start: 2015-05-24 — End: 2015-05-24
  Administered 2015-05-24: 1500 mL via INTRAVENOUS

## 2015-05-24 MED ORDER — PROMETHAZINE HCL 25 MG RE SUPP
25.0000 mg | Freq: Four times a day (QID) | RECTAL | Status: DC | PRN
  Filled 2015-05-24: qty 1

## 2015-05-24 MED ORDER — PROMETHAZINE HCL 25 MG PO TABS
ORAL_TABLET | ORAL | Status: AC
  Administered 2015-05-24: 25 mg via ORAL
  Filled 2015-05-24: qty 1

## 2015-05-24 MED ORDER — PROMETHAZINE HCL 25 MG PO TABS
25.0000 mg | ORAL_TABLET | Freq: Four times a day (QID) | ORAL | Status: DC | PRN
  Administered 2015-05-24: 25 mg via ORAL
  Filled 2015-05-24: qty 1

## 2015-05-24 NOTE — Final Progress Note (Signed)
Physician Final Progress Note  Patient ID: Suzanne Moses MRN: [id number] DOB/AGE: [birth year] 28 y.o.  Admit date: 05/24/2015 Admitting provider: Aletha Halim, MD Discharge date: 05/24/2015   Admission Diagnoses: nausea and vomiting of pregnancy  Discharge Diagnoses:  Active Problems:   Pregnancy   Nausea and vomiting of pregnancy, antepartum   Drug use affecting pregnancy in second trimester  Dehydration  Procedures:  Fetal Heart Tones 160's, No contractions Cervix per RN check: 0/thick/ballotable at 0704 IV Fluid Bolus PO phenergan      Ref Range 8:29 AM (05/24/15) 28yr ago (03/25/12) 28yr ago ([id number])    Tricyclic, Ur Screen NONE DETECTED  NONE DETECTED NEGATIVER NEGATIVER    Amphetamines, Ur Screen NONE DETECTED  NONE DETECTED NEGATIVER NEGATIVER    MDMA (Ecstasy)Ur Screen NONE DETECTED  NONE DETECTED NEGATIVER NEGATIVER    Cocaine Metabolite,Ur [place] NONE DETECTED  NONE DETECTED NEGATIVER NEGATIVER    Opiate, Ur Screen NONE DETECTED  NONE DETECTED POSITIVER NEGATIVER    Phencyclidine (PCP) Ur S NONE DETECTED  NONE DETECTED NEGATIVER NEGATIVER    Cannabinoid 50 Ng, Ur [place] NONE DETECTED  POSITIVE (A) POSITIVER POSITIVER    Barbiturates, Ur Screen NONE DETECTED  NONE DETECTED NEGATIVER NEGATIVER    Benzodiazepine, Ur Scrn NONE DETECTED  POSITIVE (A) POSITIVER, CM NEGATIVER, CM    Methadone Scn, Ur NONE DETECTED  POSITIVE (A)               Ref Range 8:29 AM  28yr ago  28yr ago     Color, Urine YELLOW  YELLOW (A)      APPearance CLEAR  TURBID (A)      Glucose, UA NEGATIVE mg/dL 50 (A)      Bilirubin Urine NEGATIVE  NEGATIVE      Ketones, ur NEGATIVE mg/dL 2+ (A)      Specific Gravity, Urine 1.005 - 1.030  1.032 (H)      Hgb urine dipstick NEGATIVE  NEGATIVE      pH 5.0 - 8.0  5.0 8.0R 9.0R    Protein, ur NEGATIVE mg/dL 30 (A) 30 mg/dLR NegativeR    Nitrite NEGATIVE  NEGATIVE Negative Negative    Leukocytes, UA NEGATIVE  NEGATIVE      RBC / HPF 0 - 5 RBC/hpf 0-5      WBC, UA 0 - 5 WBC/hpf 0-5      Bacteria, UA NONE SEEN  NONE SEEN      Squamous Epithelial / LPF NONE SEEN  6-30 (A)      Mucous  PRESENT PRESENT PRESENT    Amorphous Crystal  PRESENT     Resulting Agency  [facility]      Specimen Collected: 05/24/15 8:29 AM Last Resulted: 05/24/15 9:14 AM           Patient left hospital prior to official discharge 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 and removed own IV and left IV pole and administration set with IV catheter at [facility] Entrance  Discharge Condition: fair  Disposition: 01-Home or Self Care  Diet: Regular diet  Discharge Activity: Activity as tolerated      Discharge Instructions    Discharge activity:  No Restrictions    Complete by:  As directed      Discharge diet:  No restrictions    Complete by:  As directed      No sexual activity restrictions    Complete by:  As directed  Medication List    STOP taking these medications        oxyCODONE-acetaminophen 5-325 MG tablet  Commonly known as:  ROXICET     sucralfate 1 g tablet  Commonly known as:  CARAFATE     sulfamethoxazole-trimethoprim 800-160 MG tablet  Commonly known as:  BACTRIM DS,SEPTRA DS     traMADol 50 MG tablet  Commonly known as:  ULTRAM     ziprasidone 40 MG capsule  Commonly known as:  GEODON      TAKE these medications        promethazine 12.5 MG tablet  Commonly known as:  PHENERGAN  Take 1 tablet (12.5 mg total) by mouth every 6 (six) hours as needed for nausea or vomiting.       Follow-up Information    Go to to follow up.   Why:  regular scheduled prenatal care appointment      Total time spent taking care of this patient: 20 minutes  Signed: Kasiya Burck CNM  This patient and plan were discussed with Dr Glennon Mac 05/24/2015

## 2015-05-24 NOTE — Progress Notes (Signed)
Went to pt. Room. Pt. Not in room, belongings and IV pole missing. RN searched the unit then notified nursing supervisor. Patient's IV pole and IV line with catheter intact, found at entrance to the hospital.

## 2015-05-24 NOTE — OB Triage Note (Signed)
Pt arrived to the unit via EMS with complaint of lower abdominal pain, back pain, nausea and vomiting x 2 days. Denies any leaking fluid or vaginal bleeding. Positive Fetal movement.

## 2015-05-24 NOTE — ED Notes (Signed)
Patient presents to the ED via EMS from home. Patient with reports of N/V and diffuse abdominal pain. Patient reporting that she is currently [redacted] weeks pregnant; G2P1; followed by [facility]. Report called to LDR by this RN.

## 2015-05-24 NOTE — Progress Notes (Signed)
Asked patient about sticking her finger down her throat to vomit. Pt. Responded that it made her stomach feel better. RN counseled about possible damage and bleeding of the esophagus with persistent emesis.

## 2015-05-24 NOTE — Progress Notes (Signed)
Lab staff in room to obtain bloodwork, pt sitting upright on side of bed, LR bolusing via pump.

## 2015-05-24 NOTE — Progress Notes (Signed)
RN at bedside to perform cervical exam and start PIV, pt mentioning she has been tolerating po liquids but feels so sick that she has not been wanting to eat or drink anything, recalls being around her nephews this past week whom she thinks have mono. Says she has constant, achy pain across lower back, mid-upper abd pain since yesterday, rates 9/10, no fever, diarrhea, headache, chest pain, sob or dizziness.

## 2015-05-25 LAB — URINE CULTURE: Culture: NO GROWTH

## 2015-06-09 DIAGNOSIS — Z3A22 22 weeks gestation of pregnancy: Secondary | ICD-10-CM | POA: Diagnosis not present

## 2015-06-09 DIAGNOSIS — O09299 Supervision of pregnancy with other poor reproductive or obstetric history, unspecified trimester: Secondary | ICD-10-CM | POA: Diagnosis not present

## 2015-06-09 DIAGNOSIS — O99322 Drug use complicating pregnancy, second trimester: Secondary | ICD-10-CM | POA: Diagnosis not present

## 2015-06-09 DIAGNOSIS — O99332 Smoking (tobacco) complicating pregnancy, second trimester: Secondary | ICD-10-CM | POA: Diagnosis not present

## 2015-06-14 DIAGNOSIS — R1011 Right upper quadrant pain: Secondary | ICD-10-CM | POA: Diagnosis not present

## 2015-07-29 ENCOUNTER — Other Ambulatory Visit: Payer: Self-pay | Admitting: Obstetrics and Gynecology

## 2015-07-29 ENCOUNTER — Observation Stay
Admission: EM | Admit: 2015-07-29 | Discharge: 2015-07-29 | Disposition: A | Discharge status: Home / Self Care | Payer: Medicare Other | Attending: Obstetrics and Gynecology | Admitting: Obstetrics and Gynecology

## 2015-07-29 DIAGNOSIS — F17210 Nicotine dependence, cigarettes, uncomplicated: Secondary | ICD-10-CM | POA: Insufficient documentation

## 2015-07-29 DIAGNOSIS — R1031 Right lower quadrant pain: Secondary | ICD-10-CM | POA: Diagnosis not present

## 2015-07-29 DIAGNOSIS — O9989 Other specified diseases and conditions complicating pregnancy, childbirth and the puerperium: Secondary | ICD-10-CM | POA: Diagnosis present

## 2015-07-29 DIAGNOSIS — R112 Nausea with vomiting, unspecified: Secondary | ICD-10-CM | POA: Insufficient documentation

## 2015-07-29 DIAGNOSIS — O99333 Smoking (tobacco) complicating pregnancy, third trimester: Secondary | ICD-10-CM | POA: Diagnosis not present

## 2015-07-29 DIAGNOSIS — F1190 Opioid use, unspecified, uncomplicated: Secondary | ICD-10-CM | POA: Diagnosis not present

## 2015-07-29 DIAGNOSIS — E669 Obesity, unspecified: Secondary | ICD-10-CM | POA: Diagnosis not present

## 2015-07-29 DIAGNOSIS — Z888 Allergy status to other drugs, medicaments and biological substances status: Secondary | ICD-10-CM | POA: Diagnosis not present

## 2015-07-29 DIAGNOSIS — O26899 Other specified pregnancy related conditions, unspecified trimester: Secondary | ICD-10-CM

## 2015-07-29 DIAGNOSIS — O99323 Drug use complicating pregnancy, third trimester: Secondary | ICD-10-CM | POA: Diagnosis not present

## 2015-07-29 DIAGNOSIS — Z3A29 29 weeks gestation of pregnancy: Secondary | ICD-10-CM | POA: Insufficient documentation

## 2015-07-29 DIAGNOSIS — O99343 Other mental disorders complicating pregnancy, third trimester: Secondary | ICD-10-CM | POA: Diagnosis not present

## 2015-07-29 DIAGNOSIS — R1032 Left lower quadrant pain: Secondary | ICD-10-CM | POA: Diagnosis not present

## 2015-07-29 DIAGNOSIS — R109 Unspecified abdominal pain: Secondary | ICD-10-CM

## 2015-07-29 DIAGNOSIS — F319 Bipolar disorder, unspecified: Secondary | ICD-10-CM | POA: Diagnosis not present

## 2015-07-29 DIAGNOSIS — O99213 Obesity complicating pregnancy, third trimester: Secondary | ICD-10-CM | POA: Insufficient documentation

## 2015-07-29 DIAGNOSIS — Z6841 Body Mass Index (BMI) 40.0 and over, adult: Secondary | ICD-10-CM | POA: Insufficient documentation

## 2015-07-29 DIAGNOSIS — F1290 Cannabis use, unspecified, uncomplicated: Secondary | ICD-10-CM | POA: Insufficient documentation

## 2015-07-29 HISTORY — DX: Other specified pregnancy related conditions, unspecified trimester: O26.899

## 2015-07-29 LAB — PROTEIN / CREATININE RATIO, URINE
Creatinine, Urine: 254 mg/dL
Protein Creatinine Ratio: 0.2 mg/mg{Cre} — ABNORMAL HIGH (ref 0.00–0.15)
Total Protein, Urine: 50 mg/dL

## 2015-07-29 LAB — URINALYSIS COMPLETE WITH MICROSCOPIC (ARMC ONLY)
Bacteria, UA: NONE SEEN
Bilirubin Urine: NEGATIVE
Glucose, UA: 150 mg/dL — AB
Hgb urine dipstick: NEGATIVE
Leukocytes, UA: NEGATIVE
Nitrite: NEGATIVE
Protein, ur: 100 mg/dL — AB
Specific Gravity, Urine: 1.031 — ABNORMAL HIGH (ref 1.005–1.030)
pH: 6 (ref 5.0–8.0)

## 2015-07-29 LAB — DIFFERENTIAL
Basophils Absolute: 0.1 10*3/uL (ref 0–0.1)
Basophils Relative: 0 %
Eosinophils Absolute: 0 10*3/uL (ref 0–0.7)
Eosinophils Relative: 0 %
Lymphocytes Relative: 7 %
Lymphs Abs: 1.3 10*3/uL (ref 1.0–3.6)
Monocytes Absolute: 0.4 10*3/uL (ref 0.2–0.9)
Monocytes Relative: 2 %
Neutro Abs: 16.6 10*3/uL — ABNORMAL HIGH (ref 1.4–6.5)
Neutrophils Relative %: 91 %

## 2015-07-29 LAB — COMPREHENSIVE METABOLIC PANEL
ALT: 18 U/L (ref 14–54)
AST: 14 U/L — ABNORMAL LOW (ref 15–41)
Albumin: 2.9 g/dL — ABNORMAL LOW (ref 3.5–5.0)
Alkaline Phosphatase: 127 U/L — ABNORMAL HIGH (ref 38–126)
Anion gap: 10 (ref 5–15)
BUN: 9 mg/dL (ref 6–20)
CO2: 22 mmol/L (ref 22–32)
Calcium: 8.5 mg/dL — ABNORMAL LOW (ref 8.9–10.3)
Chloride: 105 mmol/L (ref 101–111)
Creatinine, Ser: 0.48 mg/dL (ref 0.44–1.00)
GFR calc Af Amer: >60 mL/min (ref >60)
GFR calc non Af Amer: >60 mL/min (ref >60)
Glucose, Bld: 135 mg/dL — ABNORMAL HIGH (ref 65–99)
Potassium: 3.9 mmol/L (ref 3.5–5.1)
Sodium: 137 mmol/L (ref 135–145)
Total Bilirubin: 0.4 mg/dL (ref 0.3–1.2)
Total Protein: 6.9 g/dL (ref 6.5–8.1)

## 2015-07-29 LAB — URINE DRUG SCREEN, QUALITATIVE (ARMC ONLY)
Amphetamines, Ur Screen: NOT DETECTED
Barbiturates, Ur Screen: NOT DETECTED
Benzodiazepine, Ur Scrn: NOT DETECTED
Cannabinoid 50 Ng, Ur ~~LOC~~: POSITIVE — AB
Cocaine Metabolite,Ur ~~LOC~~: NOT DETECTED
MDMA (Ecstasy)Ur Screen: NOT DETECTED
Methadone Scn, Ur: POSITIVE — AB
Opiate, Ur Screen: NOT DETECTED
Phencyclidine (PCP) Ur S: NOT DETECTED
Tricyclic, Ur Screen: NOT DETECTED

## 2015-07-29 LAB — CHLAMYDIA/NGC RT PCR (ARMC ONLY)
Chlamydia Tr: NOT DETECTED
N gonorrhoeae: NOT DETECTED

## 2015-07-29 LAB — CBC
HCT: 35 % (ref 35.0–47.0)
Hemoglobin: 11.8 g/dL — ABNORMAL LOW (ref 12.0–16.0)
MCH: 27.2 pg (ref 26.0–34.0)
MCHC: 33.6 g/dL (ref 32.0–36.0)
MCV: 81.2 fL (ref 80.0–100.0)
Platelets: 407 10*3/uL (ref 150–440)
RBC: 4.32 MIL/uL (ref 3.80–5.20)
RDW: 14.4 % (ref 11.5–14.5)
WBC: 18.9 10*3/uL — ABNORMAL HIGH (ref 3.6–11.0)

## 2015-07-29 LAB — LIPASE, BLOOD: Lipase: 11 U/L (ref 11–51)

## 2015-07-29 LAB — FETAL FIBRONECTIN: Fetal Fibronectin: POSITIVE — AB

## 2015-07-29 MED ORDER — ACETAMINOPHEN 325 MG PO TABS: 650.0000 mg | ORAL_TABLET | ORAL | Status: DC | PRN

## 2015-07-29 MED ORDER — PROMETHAZINE HCL 12.5 MG PO TABS
25.0000 mg | ORAL_TABLET | Freq: Four times a day (QID) | ORAL | Status: DC | PRN
Start: 2015-07-29 — End: 2015-09-22

## 2015-07-29 MED ORDER — NITROFURANTOIN MONOHYD MACRO 100 MG PO CAPS: 100.0000 mg | ORAL_CAPSULE | Freq: Two times a day (BID) | ORAL | Status: DC

## 2015-07-29 MED ORDER — BETAMETHASONE SOD PHOS & ACET 6 (3-3) MG/ML IJ SUSP: 12.0000 mg | Freq: Once | INTRAMUSCULAR | Status: DC

## 2015-07-29 MED ORDER — SODIUM CHLORIDE FLUSH 0.9 % IV SOLN
INTRAVENOUS | Status: AC
  Administered 2015-07-29: 10 mL
  Filled 2015-07-29: qty 10

## 2015-07-29 MED ORDER — PROCHLORPERAZINE EDISYLATE 5 MG/ML IJ SOLN
5.0000 mg | Freq: Once | INTRAMUSCULAR | Status: AC
  Administered 2015-07-29: 5 mg via INTRAVENOUS
  Filled 2015-07-29: qty 1

## 2015-07-29 MED ORDER — LACTATED RINGERS IV BOLUS (SEPSIS)
1000.0000 mL | Freq: Once | INTRAVENOUS | Status: AC
  Administered 2015-07-29: 1000 mL via INTRAVENOUS

## 2015-07-29 MED ORDER — LACTATED RINGERS IV SOLN
INTRAVENOUS | Status: DC
  Administered 2015-07-29: 150 mL/h via INTRAVENOUS

## 2015-07-29 MED ORDER — BETAMETHASONE SOD PHOS & ACET 6 (3-3) MG/ML IJ SUSP
12.0000 mg | Freq: Once | INTRAMUSCULAR | Status: AC
  Administered 2015-07-29: 12 mg via INTRAMUSCULAR
  Filled 2015-07-29: qty 1

## 2015-07-29 NOTE — OB Triage Note (Signed)
Dr. Ilda Basset at bedside to discuss lab results and need for BMZ as well as discharge instructions.  Pt and family members verbalized understanding.  MD reviewed FHR tracing prior to d/c of EFM and TOCO.  Pt reports feeling better at this time.

## 2015-07-29 NOTE — Care Management Obs Status (Signed)
[place] NOTIFICATION   Patient Details  Name: Suzanne Moses MRN: [id number] Date of Birth: Nov 27, 1985   Medicare Observation Status Notification Given:  No (rule out preterm labor / discharged in less than 24 hours)    Ival Bible, RN 07/29/2015, 7:17 PM

## 2015-07-29 NOTE — OB Triage Note (Signed)
G2P1 at 29.4 wks with ctx since last night starting at 2200.  Describes as starting in back and wrapping around to front and rating 7/10 on pain scale.  Pt reports N/V since 2300 last night and frequent bowel mov'ts (5-6 times since 2300).

## 2015-07-29 NOTE — Discharge Instructions (Signed)
Come back in 24hrs for your 2nd steroid shot  Preeclampsia and Eclampsia Preeclampsia is a serious condition that develops only during pregnancy. It is also called toxemia of pregnancy. This condition causes high blood pressure along with other symptoms, such as swelling and headaches. These may develop as the condition gets worse. Preeclampsia may occur 20 weeks or later into your pregnancy.  Diagnosing and treating preeclampsia early is very important. If not treated early, it can cause serious problems for you and your baby. One problem it can lead to is eclampsia, which is a condition that causes muscle jerking or shaking (convulsions) in the mother. Delivering your baby is the best treatment for preeclampsia or eclampsia.  SIGNS AND SYMPTOMS  The earliest signs of preeclampsia are:  High blood pressure.  Increased protein in your urine. Your health care provider will check for this at every prenatal visit. Other symptoms that can develop include:   Severe headaches.  Sudden weight gain.  Swelling of your hands, face, legs, and feet.  Feeling sick to your stomach (nauseous) and throwing up (vomiting).  Vision problems (blurred or double vision).  Numbness in your face, arms, legs, and feet.  Dizziness.  Slurred speech.  Sensitivity to bright lights.  Abdominal pain. HOME CARE INSTRUCTIONS   Only take over-the-counter or prescription medicines as directed by your health care provider.  Lie on your left side while resting. This keeps pressure off your baby.  Elevate your feet while resting.  Get regular exercise. Ask your health care provider what type of exercise is safe for you.  Avoid caffeine and alcohol.  Do not smoke.  Drink 6-8 glasses of water every day.  Eat a balanced diet that is low in salt. Do not add salt to your food.  Avoid stressful situations as much as possible.  Get plenty of rest and sleep.  Keep all prenatal appointments and tests as  scheduled. SEEK MEDICAL CARE IF:  You are gaining more weight than expected.  You have any headaches, abdominal pain, or nausea.  You are bruising more than usual.  You feel dizzy or light-headed. SEEK IMMEDIATE MEDICAL CARE IF:   You develop sudden or severe swelling anywhere in your body. This usually happens in the legs.  You gain 5 lb (2.3 kg) or more in a week.  You have a severe headache, dizziness, problems with your vision, or confusion.  You have severe abdominal pain.  You have lasting nausea or vomiting.  You have a seizure.  You have trouble moving any part of your body.  You develop numbness in your body.  You have trouble speaking.  You have any abnormal bleeding.  You develop a stiff neck.  You pass out. MAKE SURE YOU:   Understand these instructions.  Will watch your condition.  Will get help right away if you are not doing well or get worse.   This information is not intended to replace advice given to you by your health care provider. Make sure you discuss any questions you have with your health care provider.   Document Released: 03/22/2000 Document Revised: 03/30/2013 Document Reviewed: 01/15/2013 Elsevier Interactive Patient Education [employer].

## 2015-07-29 NOTE — Final Progress Note (Addendum)
OB Triage Note  07/29/2015 - 4:42 PM  30 y.o. G2P0101 [redacted]w[redacted]d here for nausea and vomiting and PTL evaluation   Subjective:  Patient states she feels much better and able to take PO, much improved nausea and vomiting and no UCs or abdominal pain. Also no s/s of pre-eclampsia   Objective:   Filed Vitals:   07/29/15 1541 07/29/15 1557 07/29/15 1624 07/29/15 1627  BP: 144/74 140/78 135/69 133/78  Pulse: 90 104 103 112  Temp:      TempSrc:      Resp:      Height:      Weight:        Current Vital Signs 24h Vital Sign Ranges  T 97.8 F (36.6 C) Temp  Avg: 97.8 F (36.6 C)  Min: 97.8 F (36.6 C)  Max: 97.8 F (36.6 C)  BP 133/78 mmHg BP  Min: 133/78  Max: 145/76  HR 80 Pulse  Avg: 102.3  Min: 90  Max: 112  RR 20 Resp  Avg: 20  Min: 20  Max: 20  SaO2   Not Delivered No Data Recorded       24 Hour I/O Current Shift I/O  Time Ins Outs   04/22 0701 - 04/22 1900 In: -  Out: 80    FHR: 150 baseline, +accels, no decel, mod var Toco: quiet Gen: NAD Abdomen: obese, soft, nttp, gravid SVE: still 1cm externall but internally closed/long/high  Labs:   Recent Labs Lab 07/29/15 1332  WBC 18.9*  HGB 11.8*  HCT 35.0  PLT 407    Recent Labs Lab 07/29/15 1332  NA 137  K 3.9  CL 105  CO2 22  BUN 9  CREATININE 0.48  CALCIUM 8.5*  PROT 6.9  BILITOT 0.4  ALKPHOS 127*  ALT 18  AST 14*  GLUCOSE 135*   FFN + UDS: +THC, methadone GC/CT: negative PC ratio: 200  Results for TAZMIN, BENNISON (MRN [id number]) as of 07/29/2015 16:42  Ref. Range 07/29/2015 14:31  Appearance Latest Ref Range: CLEAR  HAZY (A)  Bacteria, UA Latest Ref Range: NONE SEEN  NONE SEEN  Bilirubin Urine Latest Ref Range: NEGATIVE  NEGATIVE  Color, Urine Latest Ref Range: YELLOW  AMBER (A)  Glucose Latest Ref Range: NEGATIVE mg/dL 150 (A)  Hgb urine dipstick Latest Ref Range: NEGATIVE  NEGATIVE  Ketones, ur Latest Ref Range: NEGATIVE mg/dL 2+ (A)  Leukocytes, UA Latest Ref Range: NEGATIVE  NEGATIVE   Mucous Unknown PRESENT  Nitrite Latest Ref Range: NEGATIVE  NEGATIVE  pH Latest Ref Range: 5.0-8.0  6.0  Protein Latest Ref Range: NEGATIVE mg/dL 100 (A)  RBC / HPF Latest Ref Range: 0-5 RBC/hpf 0-5  Specific Gravity, Urine Latest Ref Range: 1.005-1.030  1.031 (H)  Squamous Epithelial / LPF Latest Ref Range: NONE SEEN  6-30 (A)  WBC, UA Latest Ref Range: 0-5 WBC/hpf 0-5  Total Protein, Urine Latest Units: mg/dL 50  Protein Creatinine Ratio Latest Ref Range: 0.00-0.15 mg/mgCre 0.20 (H)  Creatinine, Urine Latest Units: mg/dL 254     Assessment & Plan:  Pt doing well *IUP: category I tracing with accels *PTL, abdominal pain: +FFN but negative SVE and improved toco and s/s with IV and PO hydration. D/w pt that given her history, that I'd recommend o/n observation, BMZ now and rpt in 24hrs and SVE and if unchanged, d/c to home. Pt really wants to go home and doesn't want to stay o/n so will give BMZ now and pt states she  will come back in 24hrs for 2nd dose and PTL precautions given *?gHTN: could be due to acute event. No s/s of pre-x and negative labs. Can follow up in clinic. Precautions given to patient *Glucosuria: has 28wks labs next week. F/u a1c from today. Pt with urine coming out when she coughs (yellow and smells like urine). Will tx with macrobid bid with ucx results pending *positive UDS: pt states she is on methadone 20mg  qday and gets it from a clinic in [place] (pt unsure of the name). I told her that I was appreciative of her honesty and can follow up in clinic the exact name and call them to confirm. I told her that I'd recommend NOT trying to taper herself off while she's pregnant and the risk with that is much higher than with NAAS. I briefly d/w this with her and this can be done more in depth in clinic.  *Dispo: d/c to home after bmz#1  Aletha Halim, Brooke Bonito MD [facility] Pager [phone]

## 2015-07-29 NOTE — H&P (Signed)
Obstetrics Admission History & Physical  07/29/2015 - 2:28 PM Primary OBGYN: [facility]  Chief Complaint: LLQ pain and nausea and vomiting  History of Present Illness  30 y.o. G2P0101 @ [redacted]w[redacted]d (Dating: [place] 7/4), with the above CC. Pregnancy complicated by: PPROM @ 0000000 (pt declined), bipolar, BMI 41, polysubstance abuse, tobacco abuse, poor patient compliance, incomplete anatomy scan .  [name] states that her s/s began at around 40 yesterday with RLQ pain and nausea and vomiting started a few hours after that and has been constant. No VB, LOF, decreased FM or UCs felt. Patient hasn't been able to keep anything down. She does admit to [name] use due to vomiting during the pregnancy.  No fevers, chest pain, SOB, dysuria, hematuria, diarrhea, constipation, sick contacts, recent new foods. +chills  Review of Systems:  her 12 point review of systems is negative or as noted in the History of Present Illness.   PMHx:  Past Medical History  Diagnosis Date  . Polysubstance abuse     cocaine, marijuana, methadone, benzos  . Patient non-compliant, refused intervention or support     history of leaving AMA, 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   PSHx:  Past Surgical History  Procedure Laterality Date  . Tonsillectomy    . Ovary surgery     Medications: none  Allergies: is allergic to zofran. OBHx:  OB History  Gravida Para Term Preterm AB SAB TAB Ectopic Multiple Living  2 1  1      1     # Outcome Date GA Lbr Len/2nd Weight Sex Delivery Anes PTL Lv  2 Current           1 Preterm  [redacted]w[redacted]d                        FHx: History reviewed. No pertinent family history. Soc Hx:  Social History   Social History  . Marital Status: Single    Spouse Name: N/A  . Number of Children: N/A  . Years of Education: N/A   Occupational History  . Not on file.   Social History Main Topics  . Smoking status: Current Every Day Smoker -- 1.00 packs/day    Types: Cigarettes  . Smokeless tobacco: Not  on file  . Alcohol Use: No  . Drug Use: Not on file  . Sexual Activity: Not on file   Other Topics Concern  . Not on file   Social History Narrative    Objective    Current Vital Signs 24h Vital Sign Ranges  T 97.8 F (36.6 C) Temp  Avg: 97.8 F (36.6 C)  Min: 97.8 F (36.6 C)  Max: 97.8 F (36.6 C)  BP  145/76 No Data Recorded  HR  75 No Data Recorded  RR 20 Resp  Avg: 20  Min: 20  Max: 20  SaO2   Not Delivered No Data Recorded       24 Hour I/O Current Shift I/O  Time Ins Outs       EFM: 145 baseline, +accels, no decel, mod var. EFM picking up maternal when she throwing up just now  Toco: q3-47m  General: Well nourished, well developed female in no acute distress.  Skin:  Warm and dry.  Cardiovascular: no MRGs, normal s1 and s2 Respiratory:  Clear to auscultation bilateral. Normal respiratory effort Abdomen: nttp, obese, ND, gravid.  Neuro/Psych:  Normal mood and affect.   SSE: cx visually closed, no pooling or VB, negative  cough test SVE: cl/long/high. 1cm externally. cx nttp  Labs  Wet prep and nitrazine negative  Recent Labs Lab 07/29/15 1332  WBC 18.9*  HGB 11.8*  HCT 35.0  PLT 407    Recent Labs Lab 07/29/15 1332  NA 137  K 3.9  CL 105  CO2 22  BUN 9  CREATININE 0.48  CALCIUM 8.5*  PROT 6.9  BILITOT 0.4  ALKPHOS 127*  ALT 18  AST 14*  GLUCOSE 135*   Negative: lipase  Radiology none  Assessment & Plan   30 y.o. [id number] @ [redacted]w[redacted]d with LLQ pain and nausea and vomiting *IUP: category I with accels *abdominal pain: pelvic exam reassuring. Will send FFN given toco pattern and for extra reassurance, considering patient's history. DDx has to include GI and renal process and pt told to collect sample when she feels like she has to void so we can evaluate this.  Negative cmp except for glucose and cbc elevated at lipase, GC/CTUDS, U/A and UCx ordered; pt hasn't been able to void *?HTN: will repeat BPs but possibly due to nausea and vomiting.  Follow up pre-x labs and UDS *FEN/GI: clears as able. 1L NS bolus, IV compazine for nausea (zofran allergy) and would like to avoid phenergan given drug abuse history. a1c added on given CMP glucose   Durene Romans. MD [facility] Pager [phone]

## 2015-07-30 ENCOUNTER — Inpatient Hospital Stay
Admission: RE | Admit: 2015-07-30 | Discharge: 2015-07-30 | Disposition: A | Discharge status: Home / Self Care | Payer: Medicare Other | Attending: Obstetrics and Gynecology | Admitting: Obstetrics and Gynecology

## 2015-07-30 ENCOUNTER — Ambulatory Visit: Admit: 2015-07-30 | Payer: Self-pay | Admitting: Obstetrics and Gynecology

## 2015-07-30 DIAGNOSIS — Z36 Encounter for antenatal screening of mother: Secondary | ICD-10-CM | POA: Diagnosis not present

## 2015-07-30 LAB — HEMOGLOBIN A1C: Hgb A1c MFr Bld: 5.4 % (ref 4.0–6.0)

## 2015-07-30 MED ORDER — BETAMETHASONE SOD PHOS & ACET 6 (3-3) MG/ML IJ SUSP
INTRAMUSCULAR | Status: AC
  Administered 2015-07-30: 12 mg via INTRAMUSCULAR
  Filled 2015-07-30: qty 1

## 2015-07-30 MED ORDER — BETAMETHASONE SOD PHOS & ACET 6 (3-3) MG/ML IJ SUSP
12.0000 mg | Freq: Once | INTRAMUSCULAR | Status: AC
  Administered 2015-07-30: 12 mg via INTRAMUSCULAR

## 2015-07-30 NOTE — Discharge Summary (Signed)
See final progress note. 

## 2015-07-31 LAB — URINE CULTURE: Special Requests: NORMAL

## 2015-08-09 DIAGNOSIS — Z131 Encounter for screening for diabetes mellitus: Secondary | ICD-10-CM | POA: Diagnosis not present

## 2015-08-09 DIAGNOSIS — Z113 Encounter for screening for infections with a predominantly sexual mode of transmission: Secondary | ICD-10-CM | POA: Diagnosis not present

## 2015-08-09 DIAGNOSIS — Z3A28 28 weeks gestation of pregnancy: Secondary | ICD-10-CM | POA: Diagnosis not present

## 2015-08-09 DIAGNOSIS — Z36 Encounter for antenatal screening of mother: Secondary | ICD-10-CM | POA: Diagnosis not present

## 2015-08-30 DIAGNOSIS — Z79891 Long term (current) use of opiate analgesic: Secondary | ICD-10-CM | POA: Diagnosis not present

## 2015-09-15 DIAGNOSIS — E669 Obesity, unspecified: Secondary | ICD-10-CM | POA: Diagnosis not present

## 2015-09-15 DIAGNOSIS — Z3A37 37 weeks gestation of pregnancy: Secondary | ICD-10-CM | POA: Diagnosis not present

## 2015-09-15 DIAGNOSIS — O99213 Obesity complicating pregnancy, third trimester: Secondary | ICD-10-CM | POA: Diagnosis not present

## 2015-09-15 DIAGNOSIS — O09299 Supervision of pregnancy with other poor reproductive or obstetric history, unspecified trimester: Secondary | ICD-10-CM | POA: Diagnosis not present

## 2015-09-19 ENCOUNTER — Inpatient Hospital Stay
Admission: EM | Admit: 2015-09-19 | Discharge: 2015-09-22 | DRG: 775 | Disposition: A | Discharge status: Home / Self Care | Payer: Medicare Other | Attending: Obstetrics and Gynecology | Admitting: Obstetrics and Gynecology

## 2015-09-19 ENCOUNTER — Inpatient Hospital Stay: Payer: Medicare Other | Admitting: Anesthesiology

## 2015-09-19 DIAGNOSIS — M5136 Other intervertebral disc degeneration, lumbar region: Secondary | ICD-10-CM | POA: Diagnosis present

## 2015-09-19 DIAGNOSIS — F319 Bipolar disorder, unspecified: Secondary | ICD-10-CM | POA: Diagnosis present

## 2015-09-19 DIAGNOSIS — O99213 Obesity complicating pregnancy, third trimester: Secondary | ICD-10-CM

## 2015-09-19 DIAGNOSIS — O99324 Drug use complicating childbirth: Secondary | ICD-10-CM | POA: Diagnosis present

## 2015-09-19 DIAGNOSIS — Z3A37 37 weeks gestation of pregnancy: Secondary | ICD-10-CM | POA: Diagnosis not present

## 2015-09-19 DIAGNOSIS — F1410 Cocaine abuse, uncomplicated: Secondary | ICD-10-CM | POA: Diagnosis present

## 2015-09-19 DIAGNOSIS — O4292 Full-term premature rupture of membranes, unspecified as to length of time between rupture and onset of labor: Secondary | ICD-10-CM | POA: Diagnosis not present

## 2015-09-19 DIAGNOSIS — Z6841 Body Mass Index (BMI) 40.0 and over, adult: Secondary | ICD-10-CM

## 2015-09-19 DIAGNOSIS — Z96641 Presence of right artificial hip joint: Secondary | ICD-10-CM | POA: Diagnosis present

## 2015-09-19 DIAGNOSIS — F1199 Opioid use, unspecified with unspecified opioid-induced disorder: Secondary | ICD-10-CM | POA: Diagnosis present

## 2015-09-19 DIAGNOSIS — Z9119 Patient's noncompliance with other medical treatment and regimen: Secondary | ICD-10-CM

## 2015-09-19 DIAGNOSIS — F1910 Other psychoactive substance abuse, uncomplicated: Secondary | ICD-10-CM | POA: Diagnosis present

## 2015-09-19 DIAGNOSIS — M179 Osteoarthritis of knee, unspecified: Secondary | ICD-10-CM | POA: Diagnosis present

## 2015-09-19 DIAGNOSIS — F17200 Nicotine dependence, unspecified, uncomplicated: Secondary | ICD-10-CM | POA: Diagnosis present

## 2015-09-19 DIAGNOSIS — F1190 Opioid use, unspecified, uncomplicated: Secondary | ICD-10-CM | POA: Diagnosis present

## 2015-09-19 DIAGNOSIS — O99344 Other mental disorders complicating childbirth: Secondary | ICD-10-CM | POA: Diagnosis present

## 2015-09-19 DIAGNOSIS — O99334 Smoking (tobacco) complicating childbirth: Secondary | ICD-10-CM | POA: Diagnosis present

## 2015-09-19 DIAGNOSIS — Z79899 Other long term (current) drug therapy: Secondary | ICD-10-CM

## 2015-09-19 HISTORY — DX: Obesity complicating pregnancy, third trimester: O99.213

## 2015-09-19 HISTORY — DX: Anxiety disorder, unspecified: F41.9

## 2015-09-19 HISTORY — DX: Bipolar disorder, unspecified: F31.9

## 2015-09-19 LAB — CBC WITH DIFFERENTIAL/PLATELET
Basophils Absolute: 0.1 10*3/uL (ref 0–0.1)
Basophils Relative: 1 %
Eosinophils Absolute: 0.1 10*3/uL (ref 0–0.7)
Eosinophils Relative: 1 %
HCT: 36.6 % (ref 35.0–47.0)
Hemoglobin: 12.1 g/dL (ref 12.0–16.0)
Lymphocytes Relative: 21 %
Lymphs Abs: 3.1 10*3/uL (ref 1.0–3.6)
MCH: 25.6 pg — ABNORMAL LOW (ref 26.0–34.0)
MCHC: 33 g/dL (ref 32.0–36.0)
MCV: 77.5 fL — ABNORMAL LOW (ref 80.0–100.0)
Monocytes Absolute: 1.4 10*3/uL — ABNORMAL HIGH (ref 0.2–0.9)
Monocytes Relative: 9 %
Neutro Abs: 10.5 10*3/uL — ABNORMAL HIGH (ref 1.4–6.5)
Neutrophils Relative %: 68 %
Platelets: 382 10*3/uL (ref 150–440)
RBC: 4.73 MIL/uL (ref 3.80–5.20)
RDW: 15.9 % — ABNORMAL HIGH (ref 11.5–14.5)
WBC: 15.2 10*3/uL — ABNORMAL HIGH (ref 3.6–11.0)

## 2015-09-19 LAB — TYPE AND SCREEN
ABO/RH(D): O POS
Antibody Screen: NEGATIVE

## 2015-09-19 LAB — COMPREHENSIVE METABOLIC PANEL
ALT: 16 U/L (ref 14–54)
AST: 18 U/L (ref 15–41)
Albumin: 2.9 g/dL — ABNORMAL LOW (ref 3.5–5.0)
Alkaline Phosphatase: 226 U/L — ABNORMAL HIGH (ref 38–126)
Anion gap: 10 (ref 5–15)
BUN: 12 mg/dL (ref 6–20)
CO2: 20 mmol/L — ABNORMAL LOW (ref 22–32)
Calcium: 8.9 mg/dL (ref 8.9–10.3)
Chloride: 104 mmol/L (ref 101–111)
Creatinine, Ser: 0.52 mg/dL (ref 0.44–1.00)
GFR calc Af Amer: >60 mL/min (ref >60)
GFR calc non Af Amer: >60 mL/min (ref >60)
Glucose, Bld: 89 mg/dL (ref 65–99)
Potassium: 3.8 mmol/L (ref 3.5–5.1)
Sodium: 134 mmol/L — ABNORMAL LOW (ref 135–145)
Total Bilirubin: 0.1 mg/dL — ABNORMAL LOW (ref 0.3–1.2)
Total Protein: 7.2 g/dL (ref 6.5–8.1)

## 2015-09-19 MED ORDER — LIDOCAINE HCL (PF) 1 % IJ SOLN
INTRAMUSCULAR | Status: AC
  Filled 2015-09-19: qty 30

## 2015-09-19 MED ORDER — LACTATED RINGERS IV SOLN
INTRAVENOUS | Status: DC
  Administered 2015-09-19: 19:00:00 via INTRAVENOUS

## 2015-09-19 MED ORDER — OXYTOCIN 40 UNITS IN LACTATED RINGERS INFUSION - SIMPLE MED
INTRAVENOUS | Status: AC
  Filled 2015-09-19: qty 1000

## 2015-09-19 MED ORDER — OXYTOCIN 10 UNIT/ML IJ SOLN
INTRAMUSCULAR | Status: AC
  Filled 2015-09-19: qty 2

## 2015-09-19 MED ORDER — LIDOCAINE HCL (PF) 1 % IJ SOLN: INTRAMUSCULAR | Status: DC | PRN

## 2015-09-19 MED ORDER — AMMONIA AROMATIC IN INHA
RESPIRATORY_TRACT | Status: AC
  Filled 2015-09-19: qty 10

## 2015-09-19 MED ORDER — SOD CITRATE-CITRIC ACID 500-334 MG/5ML PO SOLN: 30.0000 mL | ORAL | Status: DC | PRN

## 2015-09-19 MED ORDER — SODIUM CHLORIDE 0.9 % IV SOLN
2.0000 g | Freq: Once | INTRAVENOUS | Status: AC
  Administered 2015-09-19: 2 g via INTRAVENOUS
  Filled 2015-09-19: qty 2000

## 2015-09-19 MED ORDER — LIDOCAINE-EPINEPHRINE (PF) 1.5 %-1:200000 IJ SOLN
INTRAMUSCULAR | Status: DC | PRN
  Administered 2015-09-19: 3 mL via PERINEURAL

## 2015-09-19 MED ORDER — FENTANYL CITRATE (PF) 100 MCG/2ML IJ SOLN
50.0000 ug | INTRAMUSCULAR | Status: AC
  Administered 2015-09-19: 50 ug via INTRAVENOUS

## 2015-09-19 MED ORDER — SODIUM CHLORIDE 0.9 % IV SOLN: 2.0000 g | Freq: Once | INTRAVENOUS | Status: DC

## 2015-09-19 MED ORDER — FENTANYL 2.5 MCG/ML W/ROPIVACAINE 0.2% IN NS 100 ML EPIDURAL INFUSION (ARMC-ANES)
EPIDURAL | Status: AC
  Administered 2015-09-19: 10 mL/h via EPIDURAL
  Filled 2015-09-19: qty 100

## 2015-09-19 MED ORDER — FENTANYL CITRATE (PF) 100 MCG/2ML IJ SOLN
INTRAMUSCULAR | Status: AC
  Filled 2015-09-19: qty 2

## 2015-09-19 MED ORDER — LIDOCAINE HCL (PF) 1 % IJ SOLN
INTRAMUSCULAR | Status: DC | PRN
  Administered 2015-09-19: 3 mL

## 2015-09-19 MED ORDER — OXYTOCIN BOLUS FROM INFUSION: 500.0000 mL | INTRAVENOUS | Status: DC

## 2015-09-19 MED ORDER — MISOPROSTOL 200 MCG PO TABS
ORAL_TABLET | ORAL | Status: AC
Start: 2015-09-19 — End: 2015-09-20
  Filled 2015-09-19: qty 4

## 2015-09-19 MED ORDER — SODIUM CHLORIDE 0.9 % IV SOLN
1.0000 g | INTRAVENOUS | Status: DC
  Filled 2015-09-19 (×3): qty 1000

## 2015-09-19 MED ORDER — OXYTOCIN 40 UNITS IN LACTATED RINGERS INFUSION - SIMPLE MED
2.5000 [IU]/h | INTRAVENOUS | Status: DC
  Administered 2015-09-20: 2.5 [IU]/h via INTRAVENOUS

## 2015-09-19 MED ORDER — LACTATED RINGERS IV SOLN
500.0000 mL | INTRAVENOUS | Status: DC | PRN
  Administered 2015-09-19: 500 mL via INTRAVENOUS

## 2015-09-19 MED ORDER — CEFAZOLIN SODIUM-DEXTROSE 2-4 GM/100ML-% IV SOLN
INTRAVENOUS | Status: DC
Start: 2015-09-19 — End: 2015-09-19
  Filled 2015-09-19: qty 100

## 2015-09-19 MED ORDER — OXYTOCIN 10 UNIT/ML IJ SOLN: 10.0000 [IU] | Freq: Once | INTRAMUSCULAR | Status: DC

## 2015-09-19 MED ORDER — LIDOCAINE HCL (PF) 1 % IJ SOLN: 30.0000 mL | INTRAMUSCULAR | Status: DC | PRN

## 2015-09-19 MED ORDER — LIDOCAINE HCL (PF) 2 % IJ SOLN
INTRAMUSCULAR | Status: DC | PRN
  Administered 2015-09-19: 5 mL via INTRADERMAL

## 2015-09-19 MED ORDER — BUPIVACAINE HCL (PF) 0.25 % IJ SOLN
INTRAMUSCULAR | Status: DC | PRN
  Administered 2015-09-19: 10 mL via EPIDURAL

## 2015-09-19 NOTE — Anesthesia Preprocedure Evaluation (Signed)
Anesthesia Evaluation  Patient identified by MRN, date of birth, ID band Patient awake    Reviewed: Allergy & Precautions, NPO status , Patient's Chart, lab work & pertinent test results  Airway Mallampati: II  TM Distance: >3 FB     Dental  (+) Chipped   Pulmonary Current Smoker,    Pulmonary exam normal        Cardiovascular negative cardio ROS Normal cardiovascular exam     Neuro/Psych PSYCHIATRIC DISORDERS Anxiety Bipolar Disorder    GI/Hepatic negative GI ROS, (+)     substance abuse  cocaine use and marijuana use,   Endo/Other  negative endocrine ROS  Renal/GU   negative genitourinary   Musculoskeletal  (+) Arthritis , Osteoarthritis,    Abdominal (+) + obese,   Peds negative pediatric ROS (+)  Hematology negative hematology ROS (+)   Anesthesia Other Findings Obese Poly drug use including methadone and benzos  Reproductive/Obstetrics (+) Pregnancy                             Anesthesia Physical Anesthesia Plan  ASA: III  Anesthesia Plan: Epidural   Post-op Pain Management:    Induction:   Airway Management Planned: Natural Airway  Additional Equipment:   Intra-op Plan:   Post-operative Plan:   Informed Consent: I have reviewed the patients History and Physical, chart, labs and discussed the procedure including the risks, benefits and alternatives for the proposed anesthesia with the patient or authorized representative who has indicated his/her understanding and acceptance.   Dental advisory given  Plan Discussed with: CRNA and Surgeon  Anesthesia Plan Comments:         Anesthesia Quick Evaluation

## 2015-09-19 NOTE — Anesthesia Procedure Notes (Signed)
Epidural Patient location during procedure: OB Start time: 09/19/2015 8:44 PM End time: 09/19/2015 8:57 PM  Staffing Anesthesiologist: Alvin Critchley Performed by: anesthesiologist   Preanesthetic Checklist Completed: patient identified, site marked, surgical consent, pre-op evaluation, timeout performed, IV checked, risks and benefits discussed and monitors and equipment checked  Epidural Patient position: sitting Prep: Betadine and site prepped and draped Patient monitoring: heart rate, cardiac monitor, continuous pulse ox and blood pressure Approach: midline Location: L3-L4 Injection technique: LOR air  Needle:  Needle type: Tuohy  Needle gauge: 17 G Needle length: 9 cm Needle insertion depth: 7 cm Catheter type: closed end flexible Catheter size: 19 Gauge Test dose: negative and 1.5% lidocaine with Epi 1:200 K  Assessment Sensory level: T8  Additional Notes Time out called.  Back prepped and draped in sterile fashion.  A skin wheal was made with 1% Lidocaine plain.  A 17 G Tuohy needle was guided into the epidural space by a loss of resistance technique.  An epidural catheter was threaded easily into the epidural space approximately 3 cm with negative TD.  Prior epidural catheter was threaded into a vein with the stiffer catheter... Easy access X 2  No blood, or paresthesias.  Patient tolerated the procedure well.Reason for block:procedure for pain

## 2015-09-19 NOTE — H&P (Signed)
OB History & Physical   History of Present Illness:  Chief Complaint: contraction and water broke  HPI:  Suzanne Moses is a 30 y.o. G7P0101 female at [redacted]w[redacted]d dated by a 7 week ultrasound.  Her pregnancy has been complicated by a history of substance abuse (cocaine, methadone, benzos, THC) and is currently taking methadone 20mg , history of PPROM and deliveyr at 34 weeks with G1, history of bipolar disorder (not on meds), obesity with initial BMI 41.    She reports contractions since about 515 pm today.   She reports leakage of fluid at 430 today, noted to be clear.   She denies vaginal bleeding.   She reports fetal movement.   No HA, no visual changes, no RUQ pain.  Maternal Medical History:   Past Medical History  Diagnosis Date  . Polysubstance abuse     cocaine, marijuana, methadone, benzos  . Patient non-compliant, refused intervention or support     history of leaving AMA, 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  . Anxiety   . Bipolar disorder [facility])     Past Surgical History  Procedure Laterality Date  . Tonsillectomy    . Ovary surgery    . Unilateral salpingectomy Left 2013    Allergies  Allergen Reactions  . Zofran [Ondansetron Hcl] Hives    Prior to Admission medications   Medication Sig Start Date End Date Taking? Authorizing Provider  nitrofurantoin, macrocrystal-monohydrate, (MACROBID) 100 MG capsule Take 1 capsule (100 mg total) by mouth 2 (two) times daily. Take the night time dose right before you go to bed so it can sit in your bladder overnight. Patient not taking: Reported on 09/19/2015 07/29/15   Aletha Halim, MD  promethazine (PHENERGAN) 12.5 MG tablet Take 2 tablets (25 mg total) by mouth every 6 (six) hours as needed for nausea or vomiting. Patient not taking: Reported on 09/19/2015 07/29/15   Aletha Halim, MD    OB History  Gravida Para Term Preterm AB SAB TAB Ectopic Multiple Living  2 1  1      1     # Outcome Date GA Lbr Len/2nd Weight Sex Delivery Anes  PTL Lv  2 Current           1 Preterm  [redacted]w[redacted]d             Prenatal care site: [facility]/  Social History: She  reports that she has been smoking Cigarettes.  She has been smoking about 0.50 packs per day. She does not have any smokeless tobacco history on file. She reports that she uses illicit drugs (Cocaine, Marijuana, and Other-see comments). She reports that she does not drink alcohol.  Family History: family history includes Cervical cancer in her maternal grandmother; Diabetes Mellitus I in her father and mother; Hypertension in her father; Lung cancer in her maternal aunt and maternal uncle.   Review of Systems: Negative x 10 systems reviewed except as noted in the HPI.    Physical Exam:  Vital Signs: BP 139/95 mmHg  Pulse 80  Temp(Src) 98.3 F (36.8 C) (Oral)  Resp 18  Ht 5\' 3"  (1.6 m)  Wt 122.471 kg (270 lb)  BMI 47.84 kg/m2  LMP 12/29/2014 (Exact Date) General: no acute distress.  HEENT: normocephalic, atraumatic Heart: regular rate & rhythm.  No murmurs/rubs/gallops Lungs: clear to auscultation bilaterally Abdomen: soft, gravid, non-tender;  EFW: 7 pounds Pelvic:   External: Normal external female genitalia  Cervix: Dilation: 5.5 / Effacement (%): 90 / Station: 0   ROM: +  pooling; + nitrazine;  Extremities: non-tender, symmetric, no edema bilaterally.  DTRs: 2+  Neurologic: Alert & oriented x 3.    Pertinent Results:  Prenatal Labs: Blood type/Rh O positive  Antibody screen negative  Rubella Immune  Varicella Immune    RPR NR  HBsAg negative  HIV negative  GC negative  Chlamydia negative  Genetic screening 2nd trimester screen negative  1 hour GTT Early 1h gtt 78, 28 week 135  3 hour GTT n/a  GBS positive   Baseline FHR: 135 beats/min   Variability: moderate   Accelerations: present   Decelerations: absent Contractions: present frequency: 3-4 q 10 min Overall assessment: category  Assessment:  Suzanne Moses is a 30 y.o. G85P0101 female at  [redacted]w[redacted]d with PROM and labor.   Plan:  1. Admit to Labor & Delivery  2. CBC, T&S, Clrs, IVF 3. GBS positive.  Ampicillin 4. Fetwal well-being: reassuring 5. History of drug abuse, UDS. Peds aware and pt aware of risk of NAS.   6. Elevated BP.  Preeclampsia labs. No sx.   Will Bonnet, MD 09/19/2015 7:22 PM

## 2015-09-20 ENCOUNTER — Encounter: Payer: Self-pay | Admitting: *Deleted

## 2015-09-20 LAB — URINE DRUG SCREEN, QUALITATIVE (ARMC ONLY)
Amphetamines, Ur Screen: NOT DETECTED
Barbiturates, Ur Screen: NOT DETECTED
Benzodiazepine, Ur Scrn: NOT DETECTED
Cannabinoid 50 Ng, Ur ~~LOC~~: POSITIVE — AB
Cocaine Metabolite,Ur ~~LOC~~: NOT DETECTED
MDMA (Ecstasy)Ur Screen: NOT DETECTED
Methadone Scn, Ur: POSITIVE — AB
Opiate, Ur Screen: NOT DETECTED
Phencyclidine (PCP) Ur S: NOT DETECTED
Tricyclic, Ur Screen: NOT DETECTED

## 2015-09-20 LAB — CBC
HCT: 32.7 % — ABNORMAL LOW (ref 35.0–47.0)
Hemoglobin: 10.6 g/dL — ABNORMAL LOW (ref 12.0–16.0)
MCH: 25.3 pg — ABNORMAL LOW (ref 26.0–34.0)
MCHC: 32.4 g/dL (ref 32.0–36.0)
MCV: 78.1 fL — ABNORMAL LOW (ref 80.0–100.0)
Platelets: 330 10*3/uL (ref 150–440)
RBC: 4.19 MIL/uL (ref 3.80–5.20)
RDW: 15.5 % — ABNORMAL HIGH (ref 11.5–14.5)
WBC: 17.7 10*3/uL — ABNORMAL HIGH (ref 3.6–11.0)

## 2015-09-20 LAB — CHLAMYDIA/NGC RT PCR (ARMC ONLY)
Chlamydia Tr: NOT DETECTED
N gonorrhoeae: NOT DETECTED

## 2015-09-20 LAB — PROTEIN / CREATININE RATIO, URINE
Creatinine, Urine: 216 mg/dL
Protein Creatinine Ratio: 0.29 mg/mg{Cre} — ABNORMAL HIGH (ref 0.00–0.15)
Total Protein, Urine: 62 mg/dL

## 2015-09-20 MED ORDER — DIPHENHYDRAMINE HCL 25 MG PO CAPS: 25.0000 mg | ORAL_CAPSULE | Freq: Four times a day (QID) | ORAL | Status: DC | PRN

## 2015-09-20 MED ORDER — COCONUT OIL OIL: 1.0000 "application " | TOPICAL_OIL | Status: DC | PRN

## 2015-09-20 MED ORDER — PRENATAL MULTIVITAMIN CH
1.0000 | ORAL_TABLET | Freq: Every day | ORAL | Status: DC
  Administered 2015-09-20 – 2015-09-22 (×3): 1 via ORAL
  Filled 2015-09-20 (×3): qty 1

## 2015-09-20 MED ORDER — IBUPROFEN 600 MG PO TABS
600.0000 mg | ORAL_TABLET | Freq: Four times a day (QID) | ORAL | Status: DC
  Administered 2015-09-20: 600 mg via ORAL
  Filled 2015-09-20: qty 1

## 2015-09-20 MED ORDER — IBUPROFEN 600 MG PO TABS
600.0000 mg | ORAL_TABLET | Freq: Four times a day (QID) | ORAL | Status: DC
  Administered 2015-09-20 – 2015-09-22 (×10): 600 mg via ORAL
  Filled 2015-09-20 (×10): qty 1

## 2015-09-20 MED ORDER — BENZOCAINE-MENTHOL 20-0.5 % EX AERO: 1.0000 "application " | INHALATION_SPRAY | CUTANEOUS | Status: DC | PRN

## 2015-09-20 MED ORDER — METHADONE HCL 10 MG PO TABS
20.0000 mg | ORAL_TABLET | Freq: Every day | ORAL | Status: DC
  Administered 2015-09-20 – 2015-09-22 (×3): 20 mg via ORAL
  Filled 2015-09-20 (×3): qty 2

## 2015-09-20 MED ORDER — SENNOSIDES-DOCUSATE SODIUM 8.6-50 MG PO TABS
2.0000 | ORAL_TABLET | ORAL | Status: DC
  Administered 2015-09-20 – 2015-09-21 (×2): 2 via ORAL
  Filled 2015-09-20 (×2): qty 2

## 2015-09-20 MED ORDER — FERROUS SULFATE 325 (65 FE) MG PO TABS
325.0000 mg | ORAL_TABLET | Freq: Two times a day (BID) | ORAL | Status: DC
  Administered 2015-09-20 (×2): 325 mg via ORAL
  Filled 2015-09-20 (×2): qty 1

## 2015-09-20 MED ORDER — WITCH HAZEL-GLYCERIN EX PADS: 1.0000 "application " | MEDICATED_PAD | CUTANEOUS | Status: DC | PRN

## 2015-09-20 MED ORDER — ACETAMINOPHEN 325 MG PO TABS
650.0000 mg | ORAL_TABLET | ORAL | Status: DC | PRN
  Administered 2015-09-20: 650 mg via ORAL
  Filled 2015-09-20: qty 2

## 2015-09-20 MED ORDER — SIMETHICONE 80 MG PO CHEW
80.0000 mg | CHEWABLE_TABLET | ORAL | Status: DC | PRN
  Administered 2015-09-21: 80 mg via ORAL
  Filled 2015-09-20: qty 1

## 2015-09-20 MED ORDER — DIBUCAINE 1 % RE OINT: 1.0000 "application " | TOPICAL_OINTMENT | RECTAL | Status: DC | PRN

## 2015-09-20 NOTE — Progress Notes (Signed)
  Postpartum Day 0  Subjective: no complaints, up ad lib, voiding and tolerating PO  Objective: Blood pressure 116/67, pulse 71, temperature 98.4 F (36.9 C), temperature source Oral, resp. rate 20, height 5\' 3"  (1.6 m), weight 270 lb (122.471 kg), last menstrual period 12/29/2014, SpO2 97 %,   Physical Exam:  General: alert and cooperative Lochia: appropriate Uterine Fundus: firm Incision: N/A DVT Evaluation: No evidence of DVT seen on physical exam. Abdomen: soft, NT   Recent Labs  09/19/15 1855 09/20/15 0505  HGB 12.1 10.6*  HCT 36.6 32.7*    Assessment PPD #0 (midnight this am), UDS + marijuana, methadone use  Plan: Continue PP care   Feeding: breast & bottle Contraception: Nexplanon (counseled re: side effects, advised to order at office next week) Blood Type: O+ RI/VI    Burlene Arnt, [place] 09/20/2015, 11:34 AM

## 2015-09-20 NOTE — Discharge Summary (Signed)
OB Discharge Summary  Patient Name: Suzanne Moses DOB: 1985/10/22 MRN: [id number]  Date of admission: 09/19/2015 Delivering MD: Prentice Docker, MD Date of Delivery: 09/20/2015  Date of discharge: 09/22/2015  Admitting diagnosis:  Patient Active Problem List   Diagnosis Date Noted  . Labor and delivery, indication for care 09/19/2015  . Obesity complicating pregnancy, third trimester (CODE) 09/19/2015  . BMI 40.0-44.9, adult ([place]) 09/19/2015  . Smoker 09/19/2015  . Bipolar disorder ([place])   . Drug use affecting pregnancy in second trimester 05/24/2015  . Polysubstance abuse 05/24/2015  . DDD (degenerative disc disease), lumbar 09/05/2014  . History of total right hip replacement 09/05/2014  . Sacroiliac joint dysfunction 09/05/2014  . DJD (degenerative joint disease) of knee 09/05/2014   Intrauterine pregnancy: [redacted]w[redacted]d      Secondary diagnosis: None     Discharge diagnosis: Term Pregnancy Delivered                                                                                                Post partum procedures:None  Augmentation: None  Complications: None  Hospital course:  Onset of Labor With Vaginal Delivery     30 y.o. yo G2P0101 at [redacted]w[redacted]d was admitted in Active Labor on 09/19/2015. Patient had an uncomplicated labor course as follows:  Membrane Rupture Time/Date: 5:00 PM ,09/19/2015   Intrapartum Procedures: Episiotomy: None [1]                                         Lacerations:  None [1]  Patient had a delivery of a Viable infant. 09/20/2015  Information for the patient's newborn:  Suzanne, Moses [id number]  Delivery Method: Vag-Spont     Pateint had an uncomplicated postpartum course.  She is ambulating, tolerating a regular diet, passing flatus, and urinating well. Patient is discharged home in stable condition on 09/22/2015.    Physical exam  Filed Vitals:   09/21/15 0843 09/21/15 1627 09/21/15 1927 09/22/15 0725  BP: 138/80 126/79 145/86 108/69   Pulse: 98 99 84 90  Temp: 98.2 F (36.8 C) 97.9 F (36.6 C) 97.4 F (36.3 C) 97.8 F (36.6 C)  TempSrc: Oral Oral Oral Oral  Resp: 18 18 20 12   Height:      Weight:      SpO2: 100%  100% 100%   General: alert, cooperative and no distress Lochia: appropriate Uterine Fundus: firm Incision: N/A DVT Evaluation: No evidence of DVT seen on physical exam.  Labs: Lab Results  Component Value Date   WBC 17.7* 09/20/2015   HGB 10.6* 09/20/2015   HCT 32.7* 09/20/2015   MCV 78.1* 09/20/2015   PLT 330 09/20/2015   CMP Latest Ref Rng 09/19/2015  Glucose 65 - 99 mg/dL 89  BUN 6 - 20 mg/dL 12  Creatinine 0.44 - 1.00 mg/dL 0.52  Sodium 135 - 145 mmol/L 134(L)  Potassium 3.5 - 5.1 mmol/L 3.8  Chloride 101 - 111 mmol/L 104  CO2 22 - 32 mmol/L 20(L)  Calcium 8.9 -  10.3 mg/dL 8.9  Total Protein 6.5 - 8.1 g/dL 7.2  Total Bilirubin 0.3 - 1.2 mg/dL 0.1(L)  Alkaline Phos 38 - 126 U/L 226(H)  AST 15 - 41 U/L 18  ALT 14 - 54 U/L 16    Discharge instruction: per After Visit Summary.  Medications:    Medication List    STOP taking these medications        nitrofurantoin (macrocrystal-monohydrate) 100 MG capsule  Commonly known as:  MACROBID     promethazine 12.5 MG tablet  Commonly known as:  PHENERGAN      TAKE these medications        methadone 10 MG tablet  Commonly known as:  DOLOPHINE  Take 2 tablets (20 mg total) by mouth daily.        Diet: routine diet  Activity: Advance as tolerated. Pelvic rest for 6 weeks.   Outpatient follow up: Follow-up Information    Follow up with Will Bonnet, MD. Schedule an appointment as soon as possible for a visit in 6 weeks.   Specialty:  Obstetrics and Gynecology   Why:  postpartum check   Contact information:   [address] [place] [address] [phone]       Go to to follow up.   Why:  scheduled appointment with Dr Wonda Amis       Postpartum contraception: Nexplanon- needs to order Rhogam Given  postpartum: NA Rubella vaccine given postpartum: Rubella Immune Varicella vaccine given postpartum: Varicella Immune TDaP given antepartum or postpartum: postpartum  Newborn Data: Live born female Birth Weight: 3110 g APGAR: 8, 9  Baby Feeding: Bottle  Disposition:NICU observation for substance withdrawal   Suzanne Moses, CNM

## 2015-09-20 NOTE — Anesthesia Postprocedure Evaluation (Signed)
Anesthesia Post Note  Patient: Suzanne Moses  Procedure(s) Performed: * No procedures listed *  Patient location during evaluation: Mother Baby Anesthesia Type: Epidural Level of consciousness: awake and alert and oriented Pain management: pain level controlled Vital Signs Assessment: post-procedure vital signs reviewed and stable Respiratory status: spontaneous breathing Cardiovascular status: stable Postop Assessment: no headache Anesthetic complications: no    Last Vitals:  Filed Vitals:   09/20/15 0406 09/20/15 0723  BP: 124/60 116/67  Pulse: 81 71  Temp: 36.8 C 36.9 C  Resp: 18 20    Last Pain:  Filed Vitals:   09/20/15 0723  PainSc: 4                  Shay Bartoli,  Clearnce Sorrel

## 2015-09-21 ENCOUNTER — Encounter: Payer: Self-pay | Admitting: Certified Nurse Midwife

## 2015-09-21 LAB — RPR: RPR Ser Ql: NONREACTIVE

## 2015-09-21 MED ORDER — FERROUS SULFATE 325 (65 FE) MG PO TABS
325.0000 mg | ORAL_TABLET | Freq: Every day | ORAL | Status: DC
  Administered 2015-09-22: 325 mg via ORAL
  Filled 2015-09-21: qty 1
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
## 2015-09-21 NOTE — Progress Notes (Signed)
Post Partum Day 1 Pregnancy complicated by bipolar disorder, smoking, obesity, hx of prior preterm birth, substance abuse (cocaine, benzos, THC), now on Methadone Subjective: up ad lib, voiding, tolerating PO and baby up all night  Objective: Blood pressure 138/80, pulse 98, temperature 98.2 F (36.8 C), temperature source Oral, resp. rate 18, height 5\' 3"  (1.6 m), weight 122.471 kg (270 lb), last menstrual period 12/29/2014, SpO2 100 %, unknown if currently breastfeeding.  Physical Exam:  General: alert and cooperative Lochia: appropriate Uterine Fundus: firm/ U-1/ ML/ NT DVT Evaluation: No evidence of DVT seen on physical exam.   Recent Labs  09/19/15 1855 09/20/15 0505  HGB 12.1 10.6*  HCT 36.6 32.7*  WBC 15.2* 17.7*  PLT 382 330    Assessment/Plan: PPD #1 stable Plan for discharge tomorrow and Social Work consult  Baby will need to stay for observation for neonatal abstinence sx O POS/ RI/ VI Continue Methadone 20 mgm daily Continue postpartum care    LOS: 2 days   Gimena Buick 09/21/2015, 9:17 AM

## 2015-09-21 NOTE — Care Management Important Message (Signed)
Important Message  Patient Details  Name: Suzanne Moses MRN: [id number] Date of Birth: Aug 09, 1985   Medicare Important Message Given:  Yes    Juliann Pulse A Lamount Bankson 09/21/2015, 3:23 PM

## 2015-09-22 MED ORDER — METHADONE HCL 10 MG PO TABS: 20.0000 mg | ORAL_TABLET | Freq: Every day | ORAL | Status: DC

## 2015-09-22 NOTE — Progress Notes (Signed)
Discharge instructions reviewed with patient.  All questions answered RX given to patient.  Pt will remain with infant in room as infant will continue to be a patient.

## 2015-09-24 ENCOUNTER — Ambulatory Visit: Payer: Self-pay

## 2015-09-24 NOTE — Lactation Note (Signed)
This note was copied from a baby's chart. Lactation Consultation Note  Patient Name: Suzanne Moses Today's Date: 09/24/2015  Mom wanting to keep pumping and dumping until she gets retested and is negative for marijuana because she wants the baby to get her breast milk.  Mom swears she has not smoked any marijuana in over a month.  Explained to mom how it is stored in fat cells and so stays in her body a lot longer.  Baby was negative when tested for marijuana.  Reviewed the risks of smoking marijuana while breast feeding or caring for her baby in general.  Information faxed to [facility] for mom to possibly get DEBP and demonstrated manual pump use until then.     Maternal Data  Mom has history of substance abuse and is currently taking Methadone.  Feeding Feeding Type: Bottle Fed - Formula Nipple Type: Slow - flow  LATCH Score/Interventions                      Lactation Tools Discussed/Used     Consult Status      Jarold Motto 09/24/2015, 11:12 AM

## 2015-09-26 DIAGNOSIS — R600 Localized edema: Secondary | ICD-10-CM | POA: Diagnosis not present

## 2015-09-26 DIAGNOSIS — Z79891 Long term (current) use of opiate analgesic: Secondary | ICD-10-CM | POA: Diagnosis not present

## 2015-10-02 DIAGNOSIS — G5603 Carpal tunnel syndrome, bilateral upper limbs: Secondary | ICD-10-CM | POA: Diagnosis not present

## 2015-10-13 DIAGNOSIS — F900 Attention-deficit hyperactivity disorder, predominantly inattentive type: Secondary | ICD-10-CM | POA: Diagnosis not present

## 2015-10-13 DIAGNOSIS — Z79899 Other long term (current) drug therapy: Secondary | ICD-10-CM | POA: Diagnosis not present

## 2015-12-20 DIAGNOSIS — Z3009 Encounter for other general counseling and advice on contraception: Secondary | ICD-10-CM | POA: Diagnosis not present

## 2016-01-09 DIAGNOSIS — Z79899 Other long term (current) drug therapy: Secondary | ICD-10-CM | POA: Diagnosis not present

## 2016-01-09 DIAGNOSIS — F900 Attention-deficit hyperactivity disorder, predominantly inattentive type: Secondary | ICD-10-CM | POA: Diagnosis not present

## 2016-01-09 DIAGNOSIS — F310 Bipolar disorder, current episode hypomanic: Secondary | ICD-10-CM | POA: Diagnosis not present

## 2016-04-26 DIAGNOSIS — F310 Bipolar disorder, current episode hypomanic: Secondary | ICD-10-CM | POA: Diagnosis not present

## 2016-04-26 DIAGNOSIS — F900 Attention-deficit hyperactivity disorder, predominantly inattentive type: Secondary | ICD-10-CM | POA: Diagnosis not present

## 2016-04-26 DIAGNOSIS — Z79899 Other long term (current) drug therapy: Secondary | ICD-10-CM | POA: Diagnosis not present

## 2016-05-31 ENCOUNTER — Other Ambulatory Visit: Payer: Self-pay | Admitting: Otolaryngology

## 2016-05-31 DIAGNOSIS — R221 Localized swelling, mass and lump, neck: Secondary | ICD-10-CM

## 2016-05-31 DIAGNOSIS — K219 Gastro-esophageal reflux disease without esophagitis: Secondary | ICD-10-CM | POA: Diagnosis not present

## 2016-05-31 DIAGNOSIS — J301 Allergic rhinitis due to pollen: Secondary | ICD-10-CM | POA: Diagnosis not present

## 2016-05-31 DIAGNOSIS — F458 Other somatoform disorders: Secondary | ICD-10-CM

## 2016-06-05 ENCOUNTER — Ambulatory Visit: Payer: Medicare Other

## 2016-06-20 DIAGNOSIS — K5900 Constipation, unspecified: Secondary | ICD-10-CM | POA: Diagnosis not present

## 2016-06-20 DIAGNOSIS — N6320 Unspecified lump in the left breast, unspecified quadrant: Secondary | ICD-10-CM | POA: Diagnosis not present

## 2016-06-20 DIAGNOSIS — Z1389 Encounter for screening for other disorder: Secondary | ICD-10-CM | POA: Diagnosis not present

## 2016-06-20 DIAGNOSIS — R350 Frequency of micturition: Secondary | ICD-10-CM | POA: Diagnosis not present

## 2016-07-08 DIAGNOSIS — F419 Anxiety disorder, unspecified: Secondary | ICD-10-CM | POA: Diagnosis not present

## 2016-07-08 DIAGNOSIS — Z79899 Other long term (current) drug therapy: Secondary | ICD-10-CM | POA: Diagnosis not present

## 2016-08-26 ENCOUNTER — Encounter: Payer: Self-pay | Admitting: Obstetrics and Gynecology

## 2016-08-26 ENCOUNTER — Ambulatory Visit (INDEPENDENT_AMBULATORY_CARE_PROVIDER_SITE_OTHER): Payer: Medicare Other | Admitting: Obstetrics and Gynecology

## 2016-08-26 VITALS — BP 128/66 | HR 104 | Ht 64.0 in | Wt 259.0 lb

## 2016-08-26 DIAGNOSIS — Z01419 Encounter for gynecological examination (general) (routine) without abnormal findings: Secondary | ICD-10-CM

## 2016-08-26 DIAGNOSIS — L02224 Furuncle of groin: Secondary | ICD-10-CM | POA: Diagnosis not present

## 2016-08-26 DIAGNOSIS — Z01411 Encounter for gynecological examination (general) (routine) with abnormal findings: Secondary | ICD-10-CM

## 2016-08-26 DIAGNOSIS — Z113 Encounter for screening for infections with a predominantly sexual mode of transmission: Secondary | ICD-10-CM

## 2016-08-26 MED ORDER — SULFAMETHOXAZOLE-TRIMETHOPRIM 800-160 MG PO TABS: 1.0000 | ORAL_TABLET | Freq: Two times a day (BID) | ORAL | 0 refills | Status: AC

## 2016-08-26 NOTE — Progress Notes (Addendum)
Suzanne Moses ID: Suzanne Moses, female   DOB: 12/28/1985, 31 y.o.   MRN: [id number]     Gynecology Annual Exam  PCP: Suzanne Moses, No Pcp Per  Chief Complaint: Annual Exam  History of Present Illness: Suzanne Moses is a 31 y.o. W2N5621 presents for annual exam. The Suzanne Moses has no complaints today.   LMP: No LMP recorded. Suzanne Moses has had an implant. Amenorrhea on nexaplon  The Suzanne Moses is sexually active. She currently uses nexplanon for contraception. She denies dyspareunia.  The Suzanne Moses does not perform self breast exams.  There is no notable family history of breast or ovarian cancer in her family.  The Suzanne Moses wears seatbelts: yes.   The Suzanne Moses has regular exercise: no.    The Suzanne Moses denies current symptoms of depression.    Review of Systems: Review of Systems  Constitutional: Negative for chills and fever.  HENT: Negative for congestion.   Respiratory: Negative for cough and shortness of breath.   Cardiovascular: Negative for chest pain and palpitations.  Gastrointestinal: Negative for abdominal pain, constipation, diarrhea, heartburn, nausea and vomiting.  Genitourinary: Negative for dysuria, frequency and urgency.  Skin: Negative for itching and rash.  Neurological: Negative for dizziness and headaches.  Endo/Heme/Allergies: Negative for polydipsia.  Psychiatric/Behavioral: Negative for depression.    Past Medical History:  Past Medical History:  Diagnosis Date  . Anxiety   . Bipolar disorder ([place])   . Suzanne Moses non-compliant, refused intervention or support    history of leaving AMA, 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  . Polysubstance abuse    cocaine, marijuana, methadone, benzos    Past Surgical History:  Past Surgical History:  Procedure Laterality Date  . OVARY SURGERY    . TONSILLECTOMY    . UNILATERAL SALPINGECTOMY Left 2013    Gynecologic History:  No LMP recorded. Suzanne Moses has had an implant. Contraception: Nexplanon Last Pap: Results were: 02/20/2015 no abnormalities    Obstetric History: [id number]  Family History:  Family History  Problem Relation Age of Onset  . Diabetes Mellitus I Mother   . Diabetes Mellitus I Father   . Hypertension Father   . Lung cancer Maternal Aunt   . Lung cancer Maternal Uncle   . Cervical cancer Maternal Grandmother     Social History:  Social History   Social History  . Marital status: Single    Spouse name: N/A  . Number of children: N/A  . Years of education: N/A   Occupational History  . Not on file.   Social History Main Topics  . Smoking status: Current Every Day Smoker    Packs/day: 0.50    Types: Cigarettes  . Smokeless tobacco: Never Used  . Alcohol use No  . Drug use: No     Comment: Methadone 20mg  daily from [facility] in [place]  . Sexual activity: Yes   Other Topics Concern  . Not on file   Social History Narrative  . No narrative on file    Allergies:  Allergies  Allergen Reactions  . Zofran [Ondansetron Hcl] Hives    Medications: Prior to Admission medications   Medication Sig Start Date End Date Taking? Authorizing Provider  ALPRAZolam Duanne Moron) 0.25 MG tablet Take by mouth. 11/30/12  Yes [provider]  amphetamine-dextroamphetamine (ADDERALL) 30 MG tablet  08/20/16  Yes [provider]  methadone (DOLOPHINE) 10 MG tablet Take 2 tablets (20 mg total) by mouth daily. 09/22/15  Yes Rod Can, CNM  permethrin Nancy Fetter) 5 % cream  06/04/16  Yes [provider]  Physical Exam Vitals: Blood pressure 128/66, pulse (!) 104, height 5\' 4"  (1.626 m), weight 259 lb (117.5 kg), unknown if currently breastfeeding. Body mass index is 44.46 kg/m.  General: NAD HEENT: normocephalic, anicteric Thyroid: no enlargement, no palpable nodules Pulmonary: No increased work of breathing, CTAB Cardiovascular: RRR, distal pulses 2+ Breast: Breast symmetrical, no tenderness, no palpable nodules or masses, no skin or nipple retraction present, no nipple discharge.  No  axillary or supraclavicular lymphadenopathy. Abdomen: NABS, soft, non-tender, non-distended.  Umbilicus without lesions.  No hepatomegaly, splenomegaly or masses palpable. No evidence of hernia  Genitourinary:  External: Normal external female genitalia.  Normal urethral meatus, normal Bartholin's and Skene's glands.  Two small boils/infected hair follicles    Vagina: Normal vaginal mucosa, no evidence of prolapse.    Cervix: Grossly normal in appearance, no bleeding  Uterus: Non-enlarged, mobile, normal contour.  No CMT  Adnexa: ovaries non-enlarged, no adnexal masses  Rectal: deferred  Lymphatic: no evidence of inguinal lymphadenopathy Extremities: no edema, erythema, or tenderness Neurologic: Grossly intact Psychiatric: mood appropriate, affect full  Female chaperone present for pelvic and breast  portions of the physical exam    Assessment: 31 y.o. M2N0037 routine annual exam  Plan: Problem List Items Addressed This Visit    None    Visit Diagnoses    Encounter for gynecological examination without abnormal finding    -  Primary   Routine screening for STI (sexually transmitted infection)       Relevant Orders   HEP, RPR, HIV Panel   GC/Chlamydia Probe Amp      1) STI screening was offered and accepted  2) ASCCP guidelines and rational discussed.  Suzanne Moses opts for every 3 years screening interval  3) Contraception - nexplanon placed 2017  4) Routine healthcare maintenance including cholesterol, diabetes screening discussed managed by PCP .  Currently does not have PCP advised need for PCP  5) Obesity - was on phentermine in the past with Dr. Caryn Section.  I'm a little hesitant of placing her on phentermine given her history of mood disorder and stimulating effects of phentermine.  6)  Boil - two small boils on inside of thigh, rx bactrim  7) Follow up 1 year for routine annual exam

## 2016-08-26 NOTE — Addendum Note (Signed)
Addended by: Dorthula Nettles on: 08/26/2016 02:14 PM   Modules accepted: Orders

## 2016-08-26 NOTE — Patient Instructions (Signed)
Preventive Care 18-39 Years, Female Preventive care refers to lifestyle choices and visits with your health care provider that can promote health and wellness. What does preventive care include?  A yearly physical exam. This is also called an annual well check.  Dental exams once or twice a year.  Routine eye exams. Ask your health care provider how often you should have your eyes checked.  Personal lifestyle choices, including:  Daily care of your teeth and gums.  Regular physical activity.  Eating a healthy diet.  Avoiding tobacco and drug use.  Limiting alcohol use.  Practicing safe sex.  Taking vitamin and mineral supplements as recommended by your health care provider. What happens during an annual well check? The services and screenings done by your health care provider during your annual well check will depend on your age, overall health, lifestyle risk factors, and family history of disease. Counseling  Your health care provider may ask you questions about your:  Alcohol use.  Tobacco use.  Drug use.  Emotional well-being.  Home and relationship well-being.  Sexual activity.  Eating habits.  Work and work environment.  Method of birth control.  Menstrual cycle.  Pregnancy history. Screening  You may have the following tests or measurements:  Height, weight, and BMI.  Diabetes screening. This is done by checking your blood sugar (glucose) after you have not eaten for a while (fasting).  Blood pressure.  Lipid and cholesterol levels. These may be checked every 5 years starting at age 20.  Skin check.  Hepatitis C blood test.  Hepatitis B blood test.  Sexually transmitted disease (STD) testing.  BRCA-related cancer screening. This may be done if you have a family history of breast, ovarian, tubal, or peritoneal cancers.  Pelvic exam and Pap test. This may be done every 3 years starting at age 21. Starting at age 30, this may be done every 5  years if you have a Pap test in combination with an HPV test. Discuss your test results, treatment options, and if necessary, the need for more tests with your health care provider. Vaccines  Your health care provider may recommend certain vaccines, such as:  Influenza vaccine. This is recommended every year.  Tetanus, diphtheria, and acellular pertussis (Tdap, Td) vaccine. You may need a Td booster every 10 years.  Varicella vaccine. You may need this if you have not been vaccinated.  HPV vaccine. If you are 26 or younger, you may need three doses over 6 months.  Measles, mumps, and rubella (MMR) vaccine. You may need at least one dose of MMR. You may also need a second dose.  Pneumococcal 13-valent conjugate (PCV13) vaccine. You may need this if you have certain conditions and were not previously vaccinated.  Pneumococcal polysaccharide (PPSV23) vaccine. You may need one or two doses if you smoke cigarettes or if you have certain conditions.  Meningococcal vaccine. One dose is recommended if you are age 19-21 years and a first-year college student living in a residence hall, or if you have one of several medical conditions. You may also need additional booster doses.  Hepatitis A vaccine. You may need this if you have certain conditions or if you travel or work in places where you may be exposed to hepatitis A.  Hepatitis B vaccine. You may need this if you have certain conditions or if you travel or work in places where you may be exposed to hepatitis B.  Haemophilus influenzae type b (Hib) vaccine. You may need this   if you have certain risk factors. Talk to your health care provider about which screenings and vaccines you need and how often you need them. This information is not intended to replace advice given to you by your health care provider. Make sure you discuss any questions you have with your health care provider. Document Released: 05/21/2001 Document Revised: 12/13/2015  Document Reviewed: 01/24/2015 Elsevier Interactive Patient Education  2017 [employer].

## 2016-08-27 LAB — HEP, RPR, HIV PANEL
HIV Screen 4th Generation wRfx: NONREACTIVE
Hepatitis B Surface Ag: NEGATIVE
RPR Ser Ql: NONREACTIVE

## 2016-08-28 LAB — GC/CHLAMYDIA PROBE AMP
Chlamydia trachomatis, NAA: NEGATIVE
Neisseria gonorrhoeae by PCR: NEGATIVE

## 2016-09-12 ENCOUNTER — Telehealth: Payer: Self-pay | Admitting: Family Medicine

## 2016-09-12 NOTE — Telephone Encounter (Signed)
OK to re-establish provided she understands we will not be prescribing any of her psychiatric medications that Dr. Rosine Door has been prescribing.

## 2016-09-12 NOTE — Telephone Encounter (Signed)
Call pt to schedule re-establish appt. No answer and voicemail wasn't set up yet, unable to leave message. Thanks TNP

## 2016-09-12 NOTE — Telephone Encounter (Signed)
Patient has not been here since 10/129/15, when she found out she was pregnant.  She does not have a primary care phys and wants to come back here.   Will you take her back??

## 2016-09-13 NOTE — Telephone Encounter (Signed)
Pt is scheduled for 09/18/16@4 :15 to reestablish with Dr Opal Sidles

## 2016-09-18 ENCOUNTER — Ambulatory Visit: Payer: Self-pay | Admitting: Family Medicine

## 2016-09-30 DIAGNOSIS — F900 Attention-deficit hyperactivity disorder, predominantly inattentive type: Secondary | ICD-10-CM | POA: Diagnosis not present

## 2016-09-30 DIAGNOSIS — Z79899 Other long term (current) drug therapy: Secondary | ICD-10-CM | POA: Diagnosis not present

## 2016-11-12 DIAGNOSIS — Z79899 Other long term (current) drug therapy: Secondary | ICD-10-CM | POA: Diagnosis not present

## 2016-11-12 DIAGNOSIS — F900 Attention-deficit hyperactivity disorder, predominantly inattentive type: Secondary | ICD-10-CM | POA: Diagnosis not present

## 2016-12-12 DIAGNOSIS — Z1389 Encounter for screening for other disorder: Secondary | ICD-10-CM | POA: Diagnosis not present

## 2016-12-12 DIAGNOSIS — R102 Pelvic and perineal pain: Secondary | ICD-10-CM | POA: Diagnosis not present

## 2016-12-19 ENCOUNTER — Other Ambulatory Visit: Payer: Self-pay | Admitting: Family Medicine

## 2016-12-19 DIAGNOSIS — R102 Pelvic and perineal pain: Secondary | ICD-10-CM

## 2016-12-26 ENCOUNTER — Ambulatory Visit
Admission: RE | Admit: 2016-12-26 | Discharge: 2016-12-26 | Disposition: A | Discharge status: Home / Self Care | Payer: Medicare Other | Source: Ambulatory Visit | Attending: Family Medicine | Admitting: Family Medicine

## 2016-12-26 DIAGNOSIS — N83201 Unspecified ovarian cyst, right side: Secondary | ICD-10-CM | POA: Diagnosis not present

## 2016-12-26 DIAGNOSIS — R102 Pelvic and perineal pain: Secondary | ICD-10-CM | POA: Insufficient documentation

## 2016-12-31 ENCOUNTER — Encounter: Payer: Self-pay | Admitting: Emergency Medicine

## 2016-12-31 ENCOUNTER — Emergency Department
Admission: EM | Admit: 2016-12-31 | Discharge: 2016-12-31 | Disposition: A | Discharge status: Home / Self Care | Payer: Medicare Other | Attending: Emergency Medicine | Admitting: Emergency Medicine

## 2016-12-31 DIAGNOSIS — H6690 Otitis media, unspecified, unspecified ear: Secondary | ICD-10-CM

## 2016-12-31 DIAGNOSIS — R0981 Nasal congestion: Secondary | ICD-10-CM | POA: Diagnosis not present

## 2016-12-31 DIAGNOSIS — H6692 Otitis media, unspecified, left ear: Secondary | ICD-10-CM | POA: Insufficient documentation

## 2016-12-31 DIAGNOSIS — F17210 Nicotine dependence, cigarettes, uncomplicated: Secondary | ICD-10-CM | POA: Diagnosis not present

## 2016-12-31 DIAGNOSIS — L539 Erythematous condition, unspecified: Secondary | ICD-10-CM | POA: Diagnosis not present

## 2016-12-31 DIAGNOSIS — Z79899 Other long term (current) drug therapy: Secondary | ICD-10-CM | POA: Diagnosis not present

## 2016-12-31 DIAGNOSIS — H7292 Unspecified perforation of tympanic membrane, left ear: Secondary | ICD-10-CM | POA: Diagnosis not present

## 2016-12-31 DIAGNOSIS — H9203 Otalgia, bilateral: Secondary | ICD-10-CM | POA: Diagnosis not present

## 2016-12-31 DIAGNOSIS — H9202 Otalgia, left ear: Secondary | ICD-10-CM | POA: Diagnosis present

## 2016-12-31 DIAGNOSIS — Z888 Allergy status to other drugs, medicaments and biological substances status: Secondary | ICD-10-CM | POA: Diagnosis not present

## 2016-12-31 DIAGNOSIS — K819 Cholecystitis, unspecified: Secondary | ICD-10-CM | POA: Diagnosis not present

## 2016-12-31 MED ORDER — LIDOCAINE HCL (PF) 1 % IJ SOLN
INTRAMUSCULAR | Status: DC
Start: 2016-12-31 — End: 2016-12-31
  Filled 2016-12-31: qty 5

## 2016-12-31 MED ORDER — LIDOCAINE HCL (PF) 1 % IJ SOLN
5.0000 mL | Freq: Once | INTRAMUSCULAR | Status: AC
  Administered 2016-12-31: 5 mL

## 2016-12-31 MED ORDER — IBUPROFEN 800 MG PO TABS
ORAL_TABLET | ORAL | Status: AC
  Filled 2016-12-31: qty 1

## 2016-12-31 MED ORDER — AMOXICILLIN-POT CLAVULANATE 875-125 MG PO TABS: 1.0000 | ORAL_TABLET | Freq: Two times a day (BID) | ORAL | 0 refills | Status: AC

## 2016-12-31 MED ORDER — IBUPROFEN 800 MG PO TABS: 800.0000 mg | ORAL_TABLET | Freq: Three times a day (TID) | ORAL | 0 refills | Status: DC | PRN

## 2016-12-31 MED ORDER — AMOXICILLIN-POT CLAVULANATE 875-125 MG PO TABS
ORAL_TABLET | ORAL | Status: AC
  Filled 2016-12-31: qty 1

## 2016-12-31 MED ORDER — IBUPROFEN 800 MG PO TABS
800.0000 mg | ORAL_TABLET | Freq: Once | ORAL | Status: AC
  Administered 2016-12-31: 800 mg via ORAL

## 2016-12-31 MED ORDER — AMOXICILLIN-POT CLAVULANATE 875-125 MG PO TABS
1.0000 | ORAL_TABLET | Freq: Once | ORAL | Status: AC
  Administered 2016-12-31: 1 via ORAL

## 2016-12-31 NOTE — ED Triage Notes (Signed)
Patient ambulatory to triage with steady gait, without difficulty or distress noted; pt c/o left ear pain tonight with recent cold symptoms

## 2016-12-31 NOTE — ED Notes (Signed)
ED Provider at bedside. 

## 2016-12-31 NOTE — ED Provider Notes (Signed)
[facility] Emergency Department Provider Note   First MD Initiated Contact with Patient 12/31/16 0131     (approximate)  I have reviewed the triage vital signs and the nursing notes.   HISTORY  Chief Complaint Otalgia    HPI Suzanne Moses is a 31 y.o. female with [place] chronic medical conditions presents to the emergency department with acute onset of left ear pain possibly 1 hour before arrival. Patient admits to being really congested the past few days with cold-like symptoms.patient denies any fever   Past Medical History:  Diagnosis Date  . Anxiety   . Bipolar disorder ([place])   . 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 Polysubstance abuse    cocaine, marijuana, methadone, benzos    Patient Active Problem List   Diagnosis Date Noted  . Labor and delivery, indication for care 09/19/2015  . Obesity complicating pregnancy, third trimester (CODE) 09/19/2015  . BMI 40.0-44.9, adult ([place]) 09/19/2015  . Smoker 09/19/2015  . Bipolar disorder ([place])   . Abdominal pain affecting pregnancy 07/29/2015  . Pregnancy 05/24/2015  . Nausea and vomiting of pregnancy, antepartum 05/24/2015  . Drug use affecting pregnancy in second trimester 05/24/2015  . Polysubstance abuse 05/24/2015  . DDD (degenerative disc disease), lumbar 09/05/2014  . History of total right hip replacement 09/05/2014  . Sacroiliac joint dysfunction 09/05/2014  . DJD (degenerative joint disease) of knee 09/05/2014    Past Surgical History:  Procedure Laterality Date  . OVARY SURGERY    . TONSILLECTOMY    . UNILATERAL SALPINGECTOMY Left 2013    Prior to Admission medications   Medication Sig Start Date End Date Taking? Authorizing Provider  ALPRAZolam Duanne Moron) 0.25 MG tablet Take by mouth. 11/30/12   [provider]  amoxicillin-clavulanate (AUGMENTIN) 875-125 MG tablet Take 1 tablet by mouth 2  (two) times daily. 12/31/16 01/10/17  Gregor Hams, MD  amphetamine-dextroamphetamine (ADDERALL) 30 MG tablet  08/20/16   [provider]  ibuprofen (ADVIL,MOTRIN) 800 MG tablet Take 1 tablet (800 mg total) by mouth every 8 (eight) hours as needed. 12/31/16   Gregor Hams, MD  methadone (DOLOPHINE) 10 MG tablet Take 2 tablets (20 mg total) by mouth daily. 09/22/15   Rod Can, CNM  permethrin Nancy Fetter) 5 % cream  06/04/16   [provider]    Allergies Zofran Alvis Lemmings hcl]  Family History  Problem Relation Age of Onset  . Diabetes Mellitus I Mother   . Diabetes Mellitus I Father   . Hypertension Father   . Lung cancer Maternal Aunt   . Lung cancer Maternal Uncle   . Cervical cancer Maternal Grandmother     Social History Social History  Substance Use Topics  . Smoking status: Current Every Day Smoker    Packs/day: 0.50    Types: Cigarettes  . Smokeless tobacco: Never Used  . Alcohol use No    Review of Systems Constitutional: No fever/chills Eyes: No visual changes. ENT: No sore throat.positive for left ear pain and nasal congestion Cardiovascular: Denies chest pain. Respiratory: Denies shortness of breath. Gastrointestinal: No abdominal pain.  No nausea, no vomiting.  No diarrhea.  No constipation. Genitourinary: Negative for dysuria. Musculoskeletal: Negative for neck pain.  Negative for back pain. Integumentary: Negative for rash. Neurological: Negative for headaches, focal weakness or numbness.  ____________________________________________   PHYSICAL EXAM:  VITAL SIGNS: ED Triage Vitals  Enc Vitals Group  BP 12/31/16 0106 133/73     Pulse Rate 12/31/16 0106 85     Resp 12/31/16 0106 20     Temp 12/31/16 0106 98.2 F (36.8 C)     Temp Source 12/31/16 0106 Oral     SpO2 12/31/16 0106 100 %     Weight 12/31/16 0104 115.2 kg (254 lb)     Height 12/31/16 0104 1.575 m (5\' 2" )     Head Circumference --      Peak Flow --       Pain Score 12/31/16 0103 7     Pain Loc --      Pain Edu? --      Excl. in [place]? --     Constitutional: Alert and oriented. Well appearing and in no acute distress. Eyes: Conjunctivae are normal. Head: Atraumatic. Ears:  left TM erythema markedly bulging no apparent TM rupture. Right TM is opacified with bulging Nose: No congestion/rhinnorhea. Mouth/Throat: Mucous membranes are moist.  Oropharynx non-erythematous. Neck: No stridor.   Cardiovascular: Normal rate, regular rhythm. Good peripheral circulation. Grossly normal heart sounds. Respiratory: Normal respiratory effort.  No retractions. Lungs CTAB. Gastrointestinal: Soft and nontender. No distention.   Musculoskeletal: No lower extremity tenderness nor edema. No gross deformities of extremities. Neurologic:  Normal speech and language. No gross focal neurologic deficits are appreciated.  Skin:  Skin is warm, dry and intact. No rash noted. Psychiatric: Mood and affect are normal. Speech and behavior are normal.   Procedures   ____________________________________________   INITIAL IMPRESSION / ASSESSMENT AND PLAN / ED COURSE  Pertinent labs & imaging results that were available during my care of the patient were reviewed by me and considered in my medical decision making (see chart for details).   31 year old female presenting with above stated history of physical exam concern for the following differential diagnoses: Acute Bacteria otitis media Left TM perforation Sinusitis   Given history of physical exam patient given Augmentin and ibuprofen in the emergency department with improvement of pain.    ____________________________________________  FINAL CLINICAL IMPRESSION(S) / ED DIAGNOSES  Final diagnoses:  Acute otitis media, unspecified otitis media type     MEDICATIONS GIVEN DURING THIS VISIT:  Medications  amoxicillin-clavulanate (AUGMENTIN) 875-125 MG per tablet 1 tablet (1 tablet Oral Given 12/31/16 0149)    lidocaine (PF) (XYLOCAINE) 1 % injection 5 mL (5 mLs Other Given 12/31/16 0149)  ibuprofen (ADVIL,MOTRIN) tablet 800 mg (800 mg Oral Given 12/31/16 0149)     NEW OUTPATIENT MEDICATIONS STARTED DURING THIS VISIT:  New Prescriptions   AMOXICILLIN-CLAVULANATE (AUGMENTIN) 875-125 MG TABLET    Take 1 tablet by mouth 2 (two) times daily.   IBUPROFEN (ADVIL,MOTRIN) 800 MG TABLET    Take 1 tablet (800 mg total) by mouth every 8 (eight) hours as needed.    Modified Medications   No medications on file    Discontinued Medications   No medications on file     Note:  This document was prepared using Dragon voice recognition software and may include unintentional dictation errors.    Gregor Hams, MD 12/31/16 [phone]

## 2016-12-31 NOTE — ED Notes (Signed)

## 2016-12-31 NOTE — ED Notes (Signed)
Pt states pain improved, but not resolved. This RN explained OTC pain management

## 2017-02-10 ENCOUNTER — Ambulatory Visit: Payer: Self-pay | Admitting: Obstetrics and Gynecology

## 2017-02-11 ENCOUNTER — Encounter: Payer: Self-pay | Admitting: Obstetrics and Gynecology

## 2017-02-11 ENCOUNTER — Ambulatory Visit (INDEPENDENT_AMBULATORY_CARE_PROVIDER_SITE_OTHER): Payer: Medicare Other | Admitting: Obstetrics and Gynecology

## 2017-02-11 VITALS — BP 132/84 | Ht 63.0 in | Wt 262.0 lb

## 2017-02-11 DIAGNOSIS — N83201 Unspecified ovarian cyst, right side: Secondary | ICD-10-CM

## 2017-02-11 DIAGNOSIS — Z113 Encounter for screening for infections with a predominantly sexual mode of transmission: Secondary | ICD-10-CM | POA: Diagnosis not present

## 2017-02-11 DIAGNOSIS — R102 Pelvic and perineal pain: Secondary | ICD-10-CM

## 2017-02-11 NOTE — Patient Instructions (Signed)
I value your feedback and appreciate you entrusting us with your care. If you get a [facility] patient survey, I would appreciate you taking the time to let us know what your experience was like. Thank you! 

## 2017-02-11 NOTE — Progress Notes (Signed)
Chief Complaint  Patient presents with  . Pelvic Pain    HPI:      [name] is a 31 y.o. [id number] who LMP was No LMP recorded. Patient has had an implant., presents today for suprapubic pelvic pain for the past 6 days. Pain is sharp, intermittent. Sitting down, BMs, coughing triggers sx, but it also occur randomly. Pain lasts 5-20 min. She has tried goody's HA powder with some relief, but pt is more concerned about cause of pain than actual pain. No urin sx, vag sx. She is sex active, occas dyspareunia in general. No sex active since pain developed. No constipation/diarrhea. Had BM today. Does have nausea/vomiting over past 6 days, too.   Pt has nexplanon, has monthly menses, lasting 3 days. No menses this month.  Pt with hx of 4 cm RTO cyst 9/18. Pt is s/p LTO cystectomy/salpingectomy a few yrs ago. Also has a ventral hernia.    Past Medical History:  Diagnosis Date  . Anxiety   . Bipolar disorder ([place])   . 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 Polysubstance abuse ([place])    cocaine, marijuana, methadone, benzos    Past Surgical History:  Procedure Laterality Date  . OVARY SURGERY    . TONSILLECTOMY    . UNILATERAL SALPINGECTOMY Left 2013    Family History  Problem Relation Age of Onset  . Diabetes Mellitus I Mother   . Diabetes Mellitus I Father   . Hypertension Father   . Lung cancer Maternal Aunt   . Lung cancer Maternal Uncle   . Cervical cancer Maternal Grandmother     Social History   Socioeconomic History  . Marital status: Single    Spouse name: Not on file  . Number of children: Not on file  . Years of education: Not on file  . Highest education level: Not on file  Social Needs  . Financial resource strain: Not on file  . Food insecurity - worry: Not on file  . Food insecurity - inability: Not on file  . Transportation needs - medical: Not on file  . Transportation  needs - non-medical: Not on file  Occupational History  . Not on file  Tobacco Use  . Smoking status: Current Every Day Smoker    Packs/day: 0.50    Types: Cigarettes  . Smokeless tobacco: Never Used  Substance and Sexual Activity  . Alcohol use: No  . Drug use: No    Comment: Methadone 20mg  daily from [facility] in [place]  . Sexual activity: Yes  Other Topics Concern  . Not on file  Social History Narrative  . Not on file     Current Outpatient Medications:  .  ALPRAZolam (XANAX) 0.25 MG tablet, Take by mouth., Disp: , Rfl:  .  amphetamine-dextroamphetamine (ADDERALL) 30 MG tablet, , Disp: , Rfl:  .  etonogestrel (NEXPLANON) 68 MG IMPL implant, 1 each once by Subdermal route., Disp: , Rfl:  .  ibuprofen (ADVIL,MOTRIN) 800 MG tablet, Take 1 tablet (800 mg total) by mouth every 8 (eight) hours as needed. (Patient not taking: Reported on 02/11/2017), Disp: 30 tablet, Rfl: 0 .  methadone (DOLOPHINE) 10 MG tablet, Take 2 tablets (20 mg total) by mouth daily. (Patient not taking: Reported on 02/11/2017), Disp: 30 tablet, Rfl: 0 .  permethrin (ELIMITE) 5 % cream, , Disp: , Rfl:    ROS:  Review of Systems  Constitutional: Negative  for fever.  Gastrointestinal: Positive for nausea and vomiting. Negative for blood in stool, constipation and diarrhea.  Genitourinary: Positive for dyspareunia and pelvic pain. Negative for dysuria, flank pain, frequency, hematuria, urgency, vaginal bleeding, vaginal discharge and vaginal pain.  Musculoskeletal: Negative for back pain.  Skin: Negative for rash.     OBJECTIVE:   Vitals:  BP 132/84   Ht 5\' 3"  (1.6 m)   Wt 262 lb (118.8 kg)   BMI 46.41 kg/m   Physical Exam  Constitutional: She is oriented to person, place, and time and well-developed, well-nourished, and in no distress. Vital signs are normal.  Abdominal: Soft. Normal appearance. There is no tenderness.  Genitourinary: Vagina normal, right adnexa normal, left adnexa normal and vulva  normal. Uterus is tender. Uterus is not enlarged. Cervix exhibits motion tenderness. Cervix exhibits no tenderness. Right adnexum displays no mass and no tenderness. Left adnexum displays no mass and no tenderness. Vulva exhibits no erythema, no exudate, no lesion, no rash and no tenderness. Vagina exhibits no lesion.  Neurological: She is oriented to person, place, and time.  Psychiatric: Memory, affect and judgment normal.  Vitals reviewed.   Results: Results for orders placed or performed in visit on 02/11/17 (from the past 24 hour(s))  POCT Urinalysis Dipstick     Status: Normal   Collection Time: 02/12/17  8:34 AM  Result Value Ref Range   Color, UA yellow    Clarity, UA     Glucose, UA neg    Bilirubin, UA neg    Ketones, UA neg    Spec Grav, UA  1.010 - 1.025   Blood, UA neg    pH, UA  5.0 - 8.0   Protein, UA neg    Urobilinogen, UA  0.2 or 1.0 E.U./dL   Nitrite, UA neg    Leukocytes, UA Negative Negative  POCT urine pregnancy     Status: Normal   Collection Time: 02/12/17  8:34 AM  Result Value Ref Range   Preg Test, Ur Negative Negative     Assessment/Plan: Pelvic pain - Tender on exam, mild CMT. Check gon/chlam. Check u/s. If neg and sx persist, may treat with abx for poss PID. - Plan: POCT Urinalysis Dipstick, POCT urine pregnancy, Chlamydia/Gonococcus/Trichomonas, NAA, US PELVIS TRANSVANGINAL NON-OB (TV ONLY)  Screening for STD (sexually transmitted disease) - Plan: Chlamydia/Gonococcus/Trichomonas, NAA  Right ovarian cyst - GYN u/s f/u. - Plan: US PELVIS TRANSVANGINAL NON-OB (TV ONLY)    Return in about 1 day (around 02/12/2017) for GYN u/s for pelvic pain--[facility] to call pt.  Jasmyn Picha B. Cheyne Boulden, PA-C 02/12/2017 8:36 AM

## 2017-02-12 ENCOUNTER — Encounter: Payer: Self-pay | Admitting: Obstetrics and Gynecology

## 2017-02-12 LAB — POCT URINE PREGNANCY: Preg Test, Ur: NEGATIVE

## 2017-02-12 LAB — POCT URINALYSIS DIPSTICK
Bilirubin, UA: NEGATIVE
Blood, UA: NEGATIVE
Glucose, UA: NEGATIVE
Ketones, UA: NEGATIVE
Leukocytes, UA: NEGATIVE
Nitrite, UA: NEGATIVE
Protein, UA: NEGATIVE

## 2017-02-15 LAB — CHLAMYDIA/GONOCOCCUS/TRICHOMONAS, NAA
Chlamydia by NAA: NEGATIVE
Gonococcus by NAA: NEGATIVE
Trich vag by NAA: NEGATIVE

## 2017-02-17 ENCOUNTER — Other Ambulatory Visit: Payer: Self-pay

## 2017-02-19 ENCOUNTER — Telehealth: Payer: Self-pay | Admitting: Obstetrics and Gynecology

## 2017-02-19 NOTE — Telephone Encounter (Signed)
LM with neg STD testing. Pt cancelled u/s so hopefully sx resolved. F/u prn.

## 2017-03-25 DIAGNOSIS — F909 Attention-deficit hyperactivity disorder, unspecified type: Secondary | ICD-10-CM | POA: Diagnosis not present

## 2017-03-25 DIAGNOSIS — Z79899 Other long term (current) drug therapy: Secondary | ICD-10-CM | POA: Diagnosis not present

## 2017-04-11 DIAGNOSIS — Z79899 Other long term (current) drug therapy: Secondary | ICD-10-CM | POA: Diagnosis not present

## 2017-04-11 DIAGNOSIS — F410 Panic disorder [episodic paroxysmal anxiety] without agoraphobia: Secondary | ICD-10-CM | POA: Diagnosis not present

## 2017-04-15 DIAGNOSIS — H6982 Other specified disorders of Eustachian tube, left ear: Secondary | ICD-10-CM | POA: Diagnosis not present

## 2017-04-15 DIAGNOSIS — R0683 Snoring: Secondary | ICD-10-CM | POA: Diagnosis not present

## 2017-05-19 DIAGNOSIS — M25562 Pain in left knee: Secondary | ICD-10-CM | POA: Diagnosis not present

## 2017-05-19 DIAGNOSIS — H539 Unspecified visual disturbance: Secondary | ICD-10-CM | POA: Diagnosis not present

## 2017-05-19 DIAGNOSIS — E669 Obesity, unspecified: Secondary | ICD-10-CM | POA: Diagnosis not present

## 2017-05-19 DIAGNOSIS — Z1389 Encounter for screening for other disorder: Secondary | ICD-10-CM | POA: Diagnosis not present

## 2017-05-26 ENCOUNTER — Ambulatory Visit
Admission: RE | Admit: 2017-05-26 | Discharge: 2017-05-26 | Disposition: A | Discharge status: Home / Self Care | Payer: Medicare Other | Source: Ambulatory Visit | Attending: Nurse Practitioner | Admitting: Nurse Practitioner

## 2017-05-26 ENCOUNTER — Encounter: Payer: Self-pay | Admitting: Nurse Practitioner

## 2017-05-26 ENCOUNTER — Other Ambulatory Visit: Payer: Self-pay

## 2017-05-26 ENCOUNTER — Ambulatory Visit: Payer: Medicare Other | Attending: Nurse Practitioner | Admitting: Nurse Practitioner

## 2017-05-26 DIAGNOSIS — F1190 Opioid use, unspecified, uncomplicated: Secondary | ICD-10-CM

## 2017-05-26 DIAGNOSIS — G894 Chronic pain syndrome: Secondary | ICD-10-CM

## 2017-05-26 DIAGNOSIS — M25562 Pain in left knee: Principal | ICD-10-CM

## 2017-05-26 DIAGNOSIS — Z888 Allergy status to other drugs, medicaments and biological substances status: Secondary | ICD-10-CM | POA: Insufficient documentation

## 2017-05-26 DIAGNOSIS — Z789 Other specified health status: Secondary | ICD-10-CM

## 2017-05-26 DIAGNOSIS — F17210 Nicotine dependence, cigarettes, uncomplicated: Secondary | ICD-10-CM | POA: Diagnosis not present

## 2017-05-26 DIAGNOSIS — M79641 Pain in right hand: Secondary | ICD-10-CM | POA: Insufficient documentation

## 2017-05-26 DIAGNOSIS — M546 Pain in thoracic spine: Secondary | ICD-10-CM | POA: Insufficient documentation

## 2017-05-26 DIAGNOSIS — F419 Anxiety disorder, unspecified: Secondary | ICD-10-CM | POA: Insufficient documentation

## 2017-05-26 DIAGNOSIS — M79642 Pain in left hand: Secondary | ICD-10-CM

## 2017-05-26 DIAGNOSIS — F319 Bipolar disorder, unspecified: Secondary | ICD-10-CM | POA: Insufficient documentation

## 2017-05-26 DIAGNOSIS — Z79899 Other long term (current) drug therapy: Secondary | ICD-10-CM | POA: Diagnosis not present

## 2017-05-26 DIAGNOSIS — Z79891 Long term (current) use of opiate analgesic: Secondary | ICD-10-CM | POA: Insufficient documentation

## 2017-05-26 DIAGNOSIS — Z96641 Presence of right artificial hip joint: Secondary | ICD-10-CM | POA: Insufficient documentation

## 2017-05-26 DIAGNOSIS — G8929 Other chronic pain: Secondary | ICD-10-CM | POA: Insufficient documentation

## 2017-05-26 DIAGNOSIS — M79604 Pain in right leg: Secondary | ICD-10-CM | POA: Insufficient documentation

## 2017-05-26 DIAGNOSIS — M899 Disorder of bone, unspecified: Secondary | ICD-10-CM

## 2017-05-26 DIAGNOSIS — Z5181 Encounter for therapeutic drug level monitoring: Secondary | ICD-10-CM | POA: Insufficient documentation

## 2017-05-26 DIAGNOSIS — M79605 Pain in left leg: Secondary | ICD-10-CM | POA: Insufficient documentation

## 2017-05-26 DIAGNOSIS — M25552 Pain in left hip: Secondary | ICD-10-CM | POA: Diagnosis not present

## 2017-05-26 DIAGNOSIS — Z9119 Patient's noncompliance with other medical treatment and regimen: Secondary | ICD-10-CM | POA: Diagnosis not present

## 2017-05-26 NOTE — Patient Instructions (Addendum)
____________________________________________________________________________________________  Appointment Policy Summary  It is our goal and responsibility to provide the medical community with assistance in the evaluation and management of patients with chronic pain. Unfortunately our resources are limited. Because we do not have an unlimited amount of time, or available appointments, we are required to closely monitor and manage their use. The following rules exist to maximize their use:  Patient's responsibilities: 1. Punctuality:  At what time should I arrive? You should be physically present in our office 30 minutes before your scheduled appointment. Your scheduled appointment is with your assigned healthcare provider. However, it takes 5-10 minutes to be "checked-in", and another 15 minutes for the nurses to do the admission. If you arrive to our office at the time you were given for your appointment, you will end up being at least 20-25 minutes late to your appointment with the provider. 2. Tardiness:  What happens if I arrive only a few minutes after my scheduled appointment time? You will need to reschedule your appointment. The cutoff is your appointment time. This is why it is so important that you arrive at least 30 minutes before that appointment. If you have an appointment scheduled for 10:00 AM and you arrive at 10:01, you will be required to reschedule your appointment.  3. Plan ahead:  Always assume that you will encounter traffic on your way in. Plan for it. If you are dependent on a driver, make sure they understand these rules and the need to arrive early. 4. Other appointments and responsibilities:  Avoid scheduling any other appointments before or after your pain clinic appointments.  5. Be prepared:  Write down everything that you need to discuss with your healthcare provider and give this information to the admitting nurse. Write down the medications that you will need  refilled. Bring your pills and bottles (even the empty ones), to all of your appointments, except for those where a procedure is scheduled. 6. No children or pets:  Find someone to take care of them. It is not appropriate to bring them in. 7. Scheduling changes:  We request "advanced notification" of any changes or cancellations. 8. Advanced notification:  Defined as a time period of more than 24 hours prior to the originally scheduled appointment. This allows for the appointment to be offered to other patients. 9. Rescheduling:  When a visit is rescheduled, it will require the cancellation of the original appointment. For this reason they both fall within the category of "Cancellations".  10. Cancellations:  They require advanced notification. Any cancellation less than 24 hours before the  appointment will be recorded as a "No Show". 11. No Show:  Defined as an unkept appointment where the patient failed to notify or declare to the practice their intention or inability to keep the appointment.  Corrective process for repeat offenders:  1. Tardiness: Three (3) episodes of rescheduling due to late arrivals will be recorded as one (1) "No Show". 2. Cancellation or reschedule: Three (3) cancellations or rescheduling will be recorded as one (1) "No Show". 3. "No Shows": Three (3) "No Shows" within a 12 month period will result in discharge from the practice.  ____________________________________________________________________________________________   ____________________________________________________________________________________________  Pain Scale  Introduction: The pain score used by this practice is the Verbal Numerical Rating Scale (VNRS-11). This is an 11-point scale. It is for adults and children 10 years or older. There are significant differences in how the pain score is reported, used, and applied. Forget everything you learned in the past  and learn this scoring  system.  General Information: The scale should reflect your current level of pain. Unless you are specifically asked for the level of your worst pain, or your average pain. If you are asked for one of these two, then it should be understood that it is over the past 24 hours.  Basic Activities of Daily Living (ADL): Personal hygiene, dressing, eating, transferring, and using restroom.  Instructions: Most patients tend to report their level of pain as a combination of two factors, their physical pain and their psychosocial pain. This last one is also known as "suffering" and it is reflection of how physical pain affects you socially and psychologically. From now on, report them separately. From this point on, when asked to report your pain level, report only your physical pain. Use the following table for reference.  Pain Clinic Pain Levels (0-5/10)  Pain Level Score  Description  No Pain 0   Mild pain 1 Nagging, annoying, but does not interfere with basic activities of daily living (ADL). Patients are able to eat, bathe, get dressed, toileting (being able to get on and off the toilet and perform personal hygiene functions), transfer (move in and out of bed or a chair without assistance), and maintain continence (able to control bladder and bowel functions). Blood pressure and heart rate are unaffected. A normal heart rate for a healthy adult ranges from 60 to 100 bpm (beats per minute).   Mild to moderate pain 2 Noticeable and distracting. Impossible to hide from other people. More frequent flare-ups. Still possible to adapt and function close to normal. It can be very annoying and may have occasional stronger flare-ups. With discipline, patients may get used to it and adapt.   Moderate pain 3 Interferes significantly with activities of daily living (ADL). It becomes difficult to feed, bathe, get dressed, get on and off the toilet or to perform personal hygiene functions. Difficult to get in and out of  bed or a chair without assistance. Very distracting. With effort, it can be ignored when deeply involved in activities.   Moderately severe pain 4 Impossible to ignore for more than a few minutes. With effort, patients may still be able to manage work or participate in some social activities. Very difficult to concentrate. Signs of autonomic nervous system discharge are evident: dilated pupils (mydriasis); mild sweating (diaphoresis); sleep interference. Heart rate becomes elevated (>115 bpm). Diastolic blood pressure (lower number) rises above 100 mmHg. Patients find relief in laying down and not moving.   Severe pain 5 Intense and extremely unpleasant. Associated with frowning face and frequent crying. Pain overwhelms the senses.  Ability to do any activity or maintain social relationships becomes significantly limited. Conversation becomes difficult. Pacing back and forth is common, as getting into a comfortable position is nearly impossible. Pain wakes you up from deep sleep. Physical signs will be obvious: pupillary dilation; increased sweating; goosebumps; brisk reflexes; cold, clammy hands and feet; nausea, vomiting or dry heaves; loss of appetite; significant sleep disturbance with inability to fall asleep or to remain asleep. When persistent, significant weight loss is observed due to the complete loss of appetite and sleep deprivation.  Blood pressure and heart rate becomes significantly elevated. Caution: If elevated blood pressure triggers a pounding headache associated with blurred vision, then the patient should immediately seek attention at an urgent or emergency care unit, as these may be signs of an impending stroke.    Emergency Department Pain Levels (6-10/10)  Emergency Room Pain  6 Severely limiting. Requires emergency care and should not be seen or managed at an outpatient pain management facility. Communication becomes difficult and requires great effort. Assistance to reach the  emergency department may be required. Facial flushing and profuse sweating along with potentially dangerous increases in heart rate and blood pressure will be evident.   Distressing pain 7 Self-care is very difficult. Assistance is required to transport, or use restroom. Assistance to reach the emergency department will be required. Tasks requiring coordination, such as bathing and getting dressed become very difficult.   Disabling pain 8 Self-care is no longer possible. At this level, pain is disabling. The individual is unable to do even the most "basic" activities such as walking, eating, bathing, dressing, transferring to a bed, or toileting. Fine motor skills are lost. It is difficult to think clearly.   Incapacitating pain 9 Pain becomes incapacitating. Thought processing is no longer possible. Difficult to remember your own name. Control of movement and coordination are lost.   The worst pain imaginable 10 At this level, most patients pass out from pain. When this level is reached, collapse of the autonomic nervous system occurs, leading to a sudden drop in blood pressure and heart rate. This in turn results in a temporary and dramatic drop in blood flow to the brain, leading to a loss of consciousness. Fainting is one of the body's self defense mechanisms. Passing out puts the brain in a calmed state and causes it to shut down for a while, in order to begin the healing process.    Summary: 1. Refer to this scale when providing [place] with your pain level. 2. Be accurate and careful when reporting your pain level. This will help with your care. 3. Over-reporting your pain level will lead to loss of credibility. 4. Even a level of 1/10 means that there is pain and will be treated at our facility. 5. High, inaccurate reporting will be documented as "Symptom Exaggeration", leading to loss of credibility and suspicions of possible secondary gains such as obtaining more narcotics, or wanting to appear  disabled, for fraudulent reasons. 6. Only pain levels of 5 or below will be seen at our facility. 7. Pain levels of 6 and above will be sent to the Emergency Department and the appointment cancelled. ____________________________________________________________________________________________    BMI Assessment: Estimated body mass index is 47.65 kg/m as calculated from the following:   Height as of this encounter: 5\' 3"  (1.6 m).   Weight as of this encounter: 269 lb (122 kg).  BMI interpretation table: BMI level Category Range association with higher incidence of chronic pain  <18 kg/m2 Underweight   18.5-24.9 kg/m2 Ideal body weight   25-29.9 kg/m2 Overweight Increased incidence by 20%  30-34.9 kg/m2 Obese (Class I) Increased incidence by 68%  35-39.9 kg/m2 Severe obesity (Class II) Increased incidence by 136%  >40 kg/m2 Extreme obesity (Class III) Increased incidence by 254%   BMI Readings from Last 4 Encounters:  05/26/17 47.65 kg/m  02/11/17 46.41 kg/m  12/31/16 46.46 kg/m  08/26/16 44.46 kg/m   Wt Readings from Last 4 Encounters:  05/26/17 269 lb (122 kg)  02/11/17 262 lb (118.8 kg)  12/31/16 254 lb (115.2 kg)  08/26/16 259 lb (117.5 kg)

## 2017-05-26 NOTE — Progress Notes (Signed)
Patient's Name: Suzanne Moses  MRN: [id number]  Referring Provider: Henrietta Dine, MD  DOB: February 10, 1986  PCP: Herminio Commons, MD  DOS: 05/26/2017  Note by: Dionisio David NP  Service setting: Ambulatory outpatient  Specialty: Interventional Pain Management  Location: [facility] (AMB) Pain Management Facility    Patient type: New Patient    Primary Reason(s) for Visit: Initial Patient Evaluation CC: Knee Pain (Left side, with mid back pain, Left hip and leg, both)  HPI  Suzanne Moses is a 32 y.o. year old, female patient, who comes today for an initial evaluation. She has DDD (degenerative disc disease), lumbar; History of total right hip replacement; Sacroiliac joint dysfunction; DJD (degenerative joint disease) of knee; Pregnancy; Nausea and vomiting of pregnancy, antepartum; Drug use affecting pregnancy in second trimester; Polysubstance abuse ([place]); Abdominal pain affecting pregnancy; Labor and delivery, indication for care; Bipolar disorder ([place]); Obesity complicating pregnancy, third trimester (CODE); BMI 40.0-44.9, adult ([place]); Smoker; Chronic pain of left knee (Primary Area of Pain); Pain in both hands (Secondary Area of Pain) (Bilateral) (R>L); Chronic pain syndrome; Opiate use; Pharmacologic therapy; Disorder of skeletal system; and Problems influencing health status on their problem list.. Her primarily concern today is the Knee Pain (Left side, with mid back pain, Left hip and leg, both)  Pain Assessment: Location: Left Knee Radiating: Denies Onset: More than a month ago Duration: Chronic pain Quality: Sharp, Tingling, Throbbing, Other (Comment)(hot pain) Severity: 7 /10 (self-reported pain score)  Note: Reported level is compatible with observation. Clinically the patient looks like a 1/10 A 1/10 is viewed as "Mild" and described as nagging, annoying, but not interfering with basic activities of daily living (ADL). Suzanne Moses is able to eat, bathe, get dressed, do toileting (being able  to get on and off the toilet and perform personal hygiene functions), transfer (move in and out of bed or a chair without assistance), and maintain continence (able to control bladder and bowel functions). Physiologic parameters such as blood pressure and heart rate apear wnl. Information on the proper use of the pain scale provided to the patient today. When using our objective Pain Scale, levels between 6 and 10/10 are said to belong in an emergency room, as it progressively worsens from a 6/10, described as severely limiting, requiring emergency care not usually available at an outpatient pain management facility. At a 6/10 level, communication becomes difficult and requires great effort. Assistance to reach the emergency department may be required. Facial flushing and profuse sweating along with potentially dangerous increases in heart rate and blood pressure will be evident. Timing: Constant Modifying factors: heating pack,  hot bath. goodie powder  Onset and Duration: Present longer than 3 months Cause of pain: Unknown Severity: Getting worse, NAS-11 at its worse: 10/10, NAS-11 at its best: 6/10, NAS-11 now: 7/10 and NAS-11 on the average: 9/10 Timing: During activity or exercise and After a period of immobility Aggravating Factors: Kneeling, Motion, Prolonged sitting, Prolonged standing and Squatting Alleviating Factors: Hot packs and Warm showers or baths Associated Problems: Depression, Fatigue, Inability to concentrate, Spasms, Swelling, Temperature changes, Tingling, Weakness and Pain that wakes patient up Quality of Pain: Agonizing, Annoying, Burning, Constant, Hot, Pressure-like, Pulsating, Sharp, Tender, Throbbing, Tingling and Uncomfortable Previous Examinations or Tests: X-rays Previous Treatments: Narcotic medications and Stretching exercises  The patient comes into the clinics today for the first time for a chronic pain management evaluation. According to the patient her primary area  of pain is in her left knee. She denies  any precipitating factors. She feels that this pain is undergone approximately 9 years. She admits that she has swelling and weakness. She admits that she does have numbness and tingling at the back of her knee. She feels like the pain is getting worse. She denies any previous surgery, interventional therapy. She has completed physical therapy several years ago but states this was not effective. She admits that she does have physical therapy pending.  Her second area of pain is in her hands. She admits that the right is greater than the left. She has been having this pain for approximately 2 years. She admits that she has numbness tingling and weakness. On the right hand digits 3,4 and 5 are affected. She admits that on the left digits 1 and 5 are affected. She denies pain being worse at certain times of the day. She admits that she has to use braces. She uses these at night which are somewhat effective. She denies any previous surgery. She did have cortisone injections 2017 which were effective. She denies previous nerve conduction study.  Today I took the time to provide the patient with information regarding this pain practice. The patient was informed that the practice is divided into two sections: an interventional pain management section, as well as a completely separate and distinct medication management section. I explained that there are procedure days for interventional therapies, and evaluation days for follow-ups and medication management. Because of the amount of documentation required during both, they are kept separated. This means that there is the possibility shey be scheduled for a procedure on one day, and medication management the next. I have also informed her  that because of staffing and facility limitations, this practice will no longer take patients for medication management only. To illustrate the reasons for this, I gave the patient the example  of surgeons, and how inappropriate it would be to refer a patient to her care, just to write for the post-surgical antibiotics on a surgery done by a different surgeon.   Because interventional pain management is part of the board-certified specialty for the doctors, the patient was informed that joining this practice means that they are open to any and all interventional therapies. I made it clear that this does not mean that they will be forced to have any procedures done. What this means is that I believe interventional therapies to be essential part of the diagnosis and proper management of chronic pain conditions. Therefore, patients not interested in these interventional alternatives will be better served under the care of a different practitioner.  The patient was also made aware of my Comprehensive Pain Management Safety Guidelines where by joining this practice, they limit all of their nerve blocks and joint injections to those done by our practice, for as long as we are retained to manage their care. Historic Controlled Substance Pharmacotherapy Review  PMP and historical list of controlled substances: Adderall 30 mg, alprazolam 0.5 mg methadone 10 mg, oxycodone/acetaminophen 5/325 mg Highest opioid analgesic regimen found: Methadone 10 mg twice daily (fill date 09/22/2015) methadone 20 mg per day Most recent opioid analgesic: none Current opioid analgesics: none Highest recorded MME/day: 60 mg/day MME/day: 0 mg/day Medications: The patient did not bring the medication(s) to the appointment, as requested in our "New Patient Package" Pharmacodynamics: Desired effects: Analgesia: The patient reports >50% benefit. Reported improvement in function: The patient reports medication allows her to accomplish basic ADLs. Clinically meaningful improvement in function (CMIF): Sustained CMIF goals met Perceived effectiveness:  Described as relatively effective, allowing for increase in activities of  daily living (ADL) Undesirable effects: Side-effects or Adverse reactions: None reported Historical Monitoring: The patient  reports that she does not use drugs. List of all UDS Test(s): Lab Results  Component Value Date   MDMA NONE DETECTED 09/19/2015   MDMA NONE DETECTED 07/29/2015   MDMA NONE DETECTED 05/24/2015   MDMA NEGATIVE 03/25/2012   MDMA NEGATIVE 12/13/2011   COCAINSCRNUR NONE DETECTED 09/19/2015   COCAINSCRNUR NONE DETECTED 07/29/2015   COCAINSCRNUR NONE DETECTED 05/24/2015   COCAINSCRNUR NEGATIVE 03/25/2012   COCAINSCRNUR NEGATIVE 12/13/2011   PCPSCRNUR NONE DETECTED 09/19/2015   PCPSCRNUR NONE DETECTED 07/29/2015   PCPSCRNUR NONE DETECTED 05/24/2015   PCPSCRNUR NEGATIVE 03/25/2012   PCPSCRNUR NEGATIVE 12/13/2011   THCU POSITIVE (A) 09/19/2015   THCU POSITIVE (A) 07/29/2015   THCU POSITIVE (A) 05/24/2015   THCU POSITIVE 03/25/2012   THCU POSITIVE 12/13/2011   List of all Serum Drug Screening Test(s):  No results found for: AMPHSCRSER, BARBSCRSER, BENZOSCRSER, COCAINSCRSER, PCPSCRSER, PCPQUANT, THCSCRSER, CANNABQUANT, OPIATESCRSER, OXYSCRSER, PROPOXSCRSER Historical Background Evaluation: [place] PDMP: Six (6) year initial data search conducted.             [place] Department of public safety, offender search: Editor, commissioning Information) Non-contributory Risk Assessment Profile: Aberrant behavior: None observed or detected today Risk factors for fatal opioid overdose: None identified today Fatal overdose hazard ratio (HR): Calculation deferred Non-fatal overdose hazard ratio (HR): Calculation deferred Risk of opioid abuse or dependence: 0.7-3.0% with doses ? 36 MME/day and 6.1-26% with doses ? 120 MME/day. Substance use disorder (SUD) risk level: Pending results of Medical Psychology Evaluation for SUD Opioid risk tool (ORT) (Total Score):    ORT Scoring interpretation table:  Score <3 = Low Risk for SUD  Score between 4-7 = Moderate Risk for SUD  Score >8 = High Risk for  Opioid Abuse   PHQ-2 Depression Scale:  Total score: 2  PHQ-2 Scoring interpretation table: (Score and probability of major depressive disorder)  Score 0 = No depression  Score 1 = 15.4% Probability  Score 2 = 21.1% Probability  Score 3 = 38.4% Probability  Score 4 = 45.5% Probability  Score 5 = 56.4% Probability  Score 6 = 78.6% Probability   PHQ-9 Depression Scale:  Total score: 6  PHQ-9 Scoring interpretation table:  Score 0-4 = No depression  Score 5-9 = Mild depression  Score 10-14 = Moderate depression  Score 15-19 = Moderately severe depression  Score 20-27 = Severe depression (2.4 times higher risk of SUD and 2.89 times higher risk of overuse)   Pharmacologic Plan: Pending ordered tests and/or consults  Meds  The patient has a current medication list which includes the following prescription(s): alprazolam, amphetamine-dextroamphetamine, etonogestrel, ibuprofen, permethrin, and ziprasidone.  Current Outpatient Medications on File Prior to Visit  Medication Sig  . ALPRAZolam (XANAX) 0.25 MG tablet Take by mouth.  Marland Kitchen amphetamine-dextroamphetamine (ADDERALL) 30 MG tablet   . etonogestrel (NEXPLANON) 68 MG IMPL implant 1 each once by Subdermal route.  Marland Kitchen ibuprofen (ADVIL,MOTRIN) 800 MG tablet Take 1 tablet (800 mg total) by mouth every 8 (eight) hours as needed.  . permethrin (ELIMITE) 5 % cream   . ziprasidone (GEODON) 80 MG capsule Take 80 mg by mouth 2 (two) times daily with a meal.   No current facility-administered medications on file prior to visit.    Imaging Review    Note: No new results found.        ROS  Cardiovascular  History: No reported cardiovascular signs or symptoms such as High blood pressure, coronary artery disease, abnormal heart rate or rhythm, heart attack, blood thinner therapy or heart weakness and/or failure Pulmonary or Respiratory History: Smoking and Snoring  Neurological History: No reported neurological signs or symptoms such as seizures,  abnormal skin sensations, urinary and/or fecal incontinence, being born with an abnormal open spine and/or a tethered spinal cord Review of Past Neurological Studies: No results found for this or any previous visit. Psychological-Psychiatric History: Anxiousness, Prone to panicking and History of abuse Gastrointestinal History: No reported gastrointestinal signs or symptoms such as vomiting or evacuating blood, reflux, heartburn, alternating episodes of diarrhea and constipation, inflamed or scarred liver, or pancreas or irrregular and/or infrequent bowel movements Genitourinary History: No reported renal or genitourinary signs or symptoms such as difficulty voiding or producing urine, peeing blood, non-functioning kidney, kidney stones, difficulty emptying the bladder, difficulty controlling the flow of urine, or chronic kidney disease Hematological History: No reported hematological signs or symptoms such as prolonged bleeding, low or poor functioning platelets, bruising or bleeding easily, hereditary bleeding problems, low energy levels due to low hemoglobin or being anemic Endocrine History: No reported endocrine signs or symptoms such as high or low blood sugar, rapid heart rate due to high thyroid levels, obesity or weight gain due to slow thyroid or thyroid disease Rheumatologic History: No reported rheumatological signs and symptoms such as fatigue, joint pain, tenderness, swelling, redness, heat, stiffness, decreased range of motion, with or without associated rash Musculoskeletal History: Negative for myasthenia gravis, muscular dystrophy, multiple sclerosis or malignant hyperthermia Work History: Disabled  Allergies  Ms. Lines is allergic to zofran [ondansetron hcl].  Laboratory Chemistry  Inflammation Markers No results found for: CRP, ESRSEDRATE (CRP: Acute Phase) (ESR: Chronic Phase) Renal Function Markers Lab Results  Component Value Date   BUN 12 09/19/2015   CREATININE 0.52  09/19/2015   GFRAA >60 09/19/2015   GFRNONAA >60 09/19/2015   Hepatic Function Markers Lab Results  Component Value Date   AST 18 09/19/2015   ALT 16 09/19/2015   ALBUMIN 2.9 (L) 09/19/2015   ALKPHOS 226 (H) 09/19/2015   Electrolytes Lab Results  Component Value Date   NA 134 (L) 09/19/2015   K 3.8 09/19/2015   CL 104 09/19/2015   CALCIUM 8.9 09/19/2015   Neuropathy Markers No results found for: [id number] Bone Pathology Markers Lab Results  Component Value Date   ALKPHOS 226 (H) 09/19/2015   CALCIUM 8.9 09/19/2015   Coagulation Parameters Lab Results  Component Value Date   PLT 330 09/20/2015   Cardiovascular Markers Lab Results  Component Value Date   HGB 10.6 (L) 09/20/2015   HCT 32.7 (L) 09/20/2015   Note: Lab results reviewed.  [place]  Drug: Ms. Sholtz  reports that she does not use drugs. Alcohol:  reports that she does not drink alcohol. Tobacco:  reports that she has been smoking cigarettes.  She has been smoking about 0.50 packs per day. she has never used smokeless tobacco. Medical:  has a past medical history of Anxiety, Bipolar disorder ([place]), Hematoma (10/19/2011), Patient non-compliant, refused intervention or support, and Polysubstance abuse ([place]). Family: family history includes Cervical cancer in her maternal grandmother; Diabetes Mellitus I in her father and mother; Hypertension in her father; Lung cancer in her maternal aunt and maternal uncle.  Past Surgical History:  Procedure Laterality Date  . OVARY SURGERY    . TONSILLECTOMY    . UNILATERAL SALPINGECTOMY Left 2013   Active  Ambulatory Problems    Diagnosis Date Noted  . DDD (degenerative disc disease), lumbar 09/05/2014  . History of total right hip replacement 09/05/2014  . Sacroiliac joint dysfunction 09/05/2014  . DJD (degenerative joint disease) of knee 09/05/2014  . Pregnancy 05/24/2015  . Nausea and vomiting of pregnancy, antepartum 05/24/2015  . Drug use affecting pregnancy in  second trimester 05/24/2015  . Polysubstance abuse ([place]) 05/24/2015  . Abdominal pain affecting pregnancy 07/29/2015  . Labor and delivery, indication for care 09/19/2015  . Bipolar disorder ([place])   . Obesity complicating pregnancy, third trimester (CODE) 09/19/2015  . BMI 40.0-44.9, adult ([place]) 09/19/2015  . Smoker 09/19/2015  . Chronic pain of left knee (Primary Area of Pain) 05/26/2017  . Pain in both hands (Secondary Area of Pain) (Bilateral) (R>L) 05/26/2017  . Chronic pain syndrome 05/26/2017  . Opiate use 05/26/2017  . Pharmacologic therapy 05/26/2017  . Disorder of skeletal system 05/26/2017  . Problems influencing health status 05/26/2017   Resolved Ambulatory Problems    Diagnosis Date Noted  . No Resolved Ambulatory Problems   Past Medical History:  Diagnosis Date  . Anxiety   . Bipolar disorder ([place])   . Hematoma 10/19/2011  . Patient non-compliant, refused intervention or support   . Polysubstance abuse ([place])    Constitutional Exam  General appearance: Well nourished, well developed, and well hydrated. In no apparent acute distress Vitals:   05/26/17 1024  BP: (!) 144/83  Pulse: (!) 109  Temp: 98 F (36.7 C)  SpO2: 100%  Weight: 269 lb (122 kg)  Height: 5' 3"  (1.6 m)   BMI Assessment: Estimated body mass index is 47.65 kg/m as calculated from the following:   Height as of this encounter: 5' 3"  (1.6 m).   Weight as of this encounter: 269 lb (122 kg).  BMI interpretation table: BMI level Category Range association with higher incidence of chronic pain  <18 kg/m2 Underweight   18.5-24.9 kg/m2 Ideal body weight   25-29.9 kg/m2 Overweight Increased incidence by 20%  30-34.9 kg/m2 Obese (Class I) Increased incidence by 68%  35-39.9 kg/m2 Severe obesity (Class II) Increased incidence by 136%  >40 kg/m2 Extreme obesity (Class III) Increased incidence by 254%   BMI Readings from Last 4 Encounters:  05/26/17 47.65 kg/m  02/11/17 46.41 kg/m  12/31/16 46.46  kg/m  08/26/16 44.46 kg/m   Wt Readings from Last 4 Encounters:  05/26/17 269 lb (122 kg)  02/11/17 262 lb (118.8 kg)  12/31/16 254 lb (115.2 kg)  08/26/16 259 lb (117.5 kg)  Psych/Mental status: Alert, oriented x 3 (person, place, & time)       Eyes: PERLA Respiratory: No evidence of acute respiratory distress  Cervical Spine Exam  Inspection: No masses, redness, or swelling Alignment: Symmetrical Functional ROM: Unrestricted ROM      Stability: No instability detected Muscle strength & Tone: Functionally intact Sensory: Unimpaired Palpation: No palpable anomalies              Upper Extremity (UE) Exam    Side: Right upper extremity  Side: Left upper extremity  Inspection: No masses, redness, swelling, or asymmetry. No contractures  Inspection: No masses, redness, swelling, or asymmetry. No contractures  Functional ROM: Unrestricted ROM          Functional ROM: Unrestricted ROM          Muscle strength & Tone: Functionally intact  Muscle strength & Tone: Functionally intact  Sensory: Unimpaired  Sensory: Unimpaired  Palpation: No palpable anomalies  Palpation: No palpable anomalies              Specialized Test(s): Deferred         Specialized Test(s): Deferred          Thoracic Spine Exam  Inspection: No masses, redness, or swelling Alignment: Symmetrical Functional ROM: Unrestricted ROM Stability: No instability detected Sensory: Unimpaired Muscle strength & Tone: No palpable anomalies  Lumbar Spine Exam  Inspection: No masses, redness, or swelling Alignment: Symmetrical Functional ROM: Unrestricted ROM      Stability: No instability detected Muscle strength & Tone: Functionally intact Sensory: Unimpaired Palpation: No palpable anomalies       Provocative Tests: Lumbar Hyperextension and rotation test: evaluation deferred today       Patrick's Maneuver: evaluation deferred today                    Gait & Posture Assessment  Ambulation:  Unassisted Gait: Relatively normal for age and body habitus Posture: WNL   Lower Extremity Exam    Side: Right lower extremity  Side: Left lower extremity  Inspection: No masses, redness, swelling, or asymmetry. No contractures  Inspection: Edema  Functional ROM: Adequate ROM          Functional ROM: Adequate ROM          Muscle strength & Tone: Functionally intact  Muscle strength & Tone: Functionally intact  Sensory: Unimpaired  Sensory: Unimpaired  Palpation: Non-tender  Palpation: Tender   Assessment  Primary Diagnosis & Pertinent Problem List: Diagnoses of Chronic pain of left knee (Primary Area of Pain), Pain in both hands (Secondary Area of Pain) (Bilateral) (R>L), Chronic pain syndrome, Opiate use, Pharmacologic therapy, Disorder of skeletal system, and Problems influencing health status were pertinent to this visit.  Visit Diagnosis: 1. Chronic pain of left knee (Primary Area of Pain)   2. Pain in both hands (Secondary Area of Pain) (Bilateral) (R>L)   3. Chronic pain syndrome   4. Opiate use   5. Pharmacologic therapy   6. Disorder of skeletal system   7. Problems influencing health status    Plan of Care  Initial treatment plan:  Please be advised that as per protocol, today's visit has been an evaluation only. We have not taken over the patient's controlled substance management.  Problem-specific plan: No problem-specific Assessment & Plan notes found for this encounter.  Ordered Lab-work, Procedure(s), Referral(s), & Consult(s): Orders Placed This Encounter  Procedures  . DG Knee 1-2 Views Left  . Compliance Drug Analysis, Ur  . Comp. Metabolic Panel (12)  . Magnesium  . Vitamin B12  . Sedimentation rate  . 25-Hydroxyvitamin D Lcms D2+D3  . C-reactive protein  . Ambulatory referral to Psychology  . NCV with EMG(electromyography)   Pharmacotherapy: Medications ordered:  No orders of the defined types were placed in this encounter.  Medications  administered during this visit: Kayton E. Giron "Benjamine Mola" had no medications administered during this visit.   Pharmacotherapy under consideration:  Opioid Analgesics: The patient was informed that there is no guarantee that she would be a candidate for opioid analgesics. The decision will be made following CDC guidelines. This decision will be based on the results of diagnostic studies, as well as Ms. Cinco's risk profile.  Membrane stabilizer: To be determined at a later time Muscle relaxant: To be determined at a later time NSAID: To be determined at a later time Other analgesic(s): To be determined at a later time   Interventional therapies  under consideration: Ms. Deroo was informed that there is no guarantee that she would be a candidate for interventional therapies. The decision will be based on the results of diagnostic studies, as well as Ms. Yett's risk profile.  Possible procedure(s): Diagnostic left knee intra-articular joint injection Hyalgan series Possible left knee genicular nerve Possible left knee genicular nerve RFA Diagnostic bilateral wrist joint injection   Provider-requested follow-up: Return for 2nd Visit, w/ Dr. Dossie Arbour, after MedPsych eval.  Future Appointments  Date Time Provider [place]  06/10/2017  9:30 AM Shelton Silvas, PT [facility] None  06/12/2017  1:00 PM Shelton Silvas, PT [facility] None  06/17/2017  1:45 PM Shelton Silvas, PT [facility] None    Primary Care Physician: Herminio Commons, MD Location: [facility] Outpatient Pain Management Facility Note by:  Date: 05/26/2017; Time: 2:37 PM  Pain Score Disclaimer: We use the NRS-11 scale. This is a self-reported, subjective measurement of pain severity with only modest accuracy. It is used primarily to identify changes within a particular patient. It must be understood that outpatient pain scales are significantly less accurate that those used for research, where they can be applied under ideal controlled  circumstances with minimal exposure to variables. In reality, the score is likely to be a combination of pain intensity and pain affect, where pain affect describes the degree of emotional arousal or changes in action readiness caused by the sensory experience of pain. Factors such as social and work situation, setting, emotional state, anxiety levels, expectation, and prior pain experience may influence pain perception and show large inter-individual differences that may also be affected by time variables.  Patient instructions provided during this appointment: Patient Instructions    ____________________________________________________________________________________________  Appointment Policy Summary  It is our goal and responsibility to provide the medical community with assistance in the evaluation and management of patients with chronic pain. Unfortunately our resources are limited. Because we do not have an unlimited amount of time, or available appointments, we are required to closely monitor and manage their use. The following rules exist to maximize their use:  Patient's responsibilities: 1. Punctuality:  At what time should I arrive? You should be physically present in our office 30 minutes before your scheduled appointment. Your scheduled appointment is with your assigned healthcare provider. However, it takes 5-10 minutes to be "checked-in", and another 15 minutes for the nurses to do the admission. If you arrive to our office at the time you were given for your appointment, you will end up being at least 20-25 minutes late to your appointment with the provider. 2. Tardiness:  What happens if I arrive only a few minutes after my scheduled appointment time? You will need to reschedule your appointment. The cutoff is your appointment time. This is why it is so important that you arrive at least 30 minutes before that appointment. If you have an appointment scheduled for 10:00 AM and you  arrive at 10:01, you will be required to reschedule your appointment.  3. Plan ahead:  Always assume that you will encounter traffic on your way in. Plan for it. If you are dependent on a driver, make sure they understand these rules and the need to arrive early. 4. Other appointments and responsibilities:  Avoid scheduling any other appointments before or after your pain clinic appointments.  5. Be prepared:  Write down everything that you need to discuss with your healthcare provider and give this information to the admitting nurse. Write down the medications that you will need refilled.  Bring your pills and bottles (even the empty ones), to all of your appointments, except for those where a procedure is scheduled. 6. No children or pets:  Find someone to take care of them. It is not appropriate to bring them in. 7. Scheduling changes:  We request "advanced notification" of any changes or cancellations. 8. Advanced notification:  Defined as a time period of more than 24 hours prior to the originally scheduled appointment. This allows for the appointment to be offered to other patients. 9. Rescheduling:  When a visit is rescheduled, it will require the cancellation of the original appointment. For this reason they both fall within the category of "Cancellations".  10. Cancellations:  They require advanced notification. Any cancellation less than 24 hours before the  appointment will be recorded as a "No Show". 11. No Show:  Defined as an unkept appointment where the patient failed to notify or declare to the practice their intention or inability to keep the appointment.  Corrective process for repeat offenders:  1. Tardiness: Three (3) episodes of rescheduling due to late arrivals will be recorded as one (1) "No Show". 2. Cancellation or reschedule: Three (3) cancellations or rescheduling will be recorded as one (1) "No Show". 3. "No Shows": Three (3) "No Shows" within a 12 month period will  result in discharge from the practice.  ____________________________________________________________________________________________   ____________________________________________________________________________________________  Pain Scale  Introduction: The pain score used by this practice is the Verbal Numerical Rating Scale (VNRS-11). This is an 11-point scale. It is for adults and children 10 years or older. There are significant differences in how the pain score is reported, used, and applied. Forget everything you learned in the past and learn this scoring system.  General Information: The scale should reflect your current level of pain. Unless you are specifically asked for the level of your worst pain, or your average pain. If you are asked for one of these two, then it should be understood that it is over the past 24 hours.  Basic Activities of Daily Living (ADL): Personal hygiene, dressing, eating, transferring, and using restroom.  Instructions: Most patients tend to report their level of pain as a combination of two factors, their physical pain and their psychosocial pain. This last one is also known as "suffering" and it is reflection of how physical pain affects you socially and psychologically. From now on, report them separately. From this point on, when asked to report your pain level, report only your physical pain. Use the following table for reference.  Pain Clinic Pain Levels (0-5/10)  Pain Level Score  Description  No Pain 0   Mild pain 1 Nagging, annoying, but does not interfere with basic activities of daily living (ADL). Patients are able to eat, bathe, get dressed, toileting (being able to get on and off the toilet and perform personal hygiene functions), transfer (move in and out of bed or a chair without assistance), and maintain continence (able to control bladder and bowel functions). Blood pressure and heart rate are unaffected. A normal heart rate for a healthy  adult ranges from 60 to 100 bpm (beats per minute).   Mild to moderate pain 2 Noticeable and distracting. Impossible to hide from other people. More frequent flare-ups. Still possible to adapt and function close to normal. It can be very annoying and may have occasional stronger flare-ups. With discipline, patients may get used to it and adapt.   Moderate pain 3 Interferes significantly with activities of daily living (ADL).  It becomes difficult to feed, bathe, get dressed, get on and off the toilet or to perform personal hygiene functions. Difficult to get in and out of bed or a chair without assistance. Very distracting. With effort, it can be ignored when deeply involved in activities.   Moderately severe pain 4 Impossible to ignore for more than a few minutes. With effort, patients may still be able to manage work or participate in some social activities. Very difficult to concentrate. Signs of autonomic nervous system discharge are evident: dilated pupils (mydriasis); mild sweating (diaphoresis); sleep interference. Heart rate becomes elevated (>115 bpm). Diastolic blood pressure (lower number) rises above 100 mmHg. Patients find relief in laying down and not moving.   Severe pain 5 Intense and extremely unpleasant. Associated with frowning face and frequent crying. Pain overwhelms the senses.  Ability to do any activity or maintain social relationships becomes significantly limited. Conversation becomes difficult. Pacing back and forth is common, as getting into a comfortable position is nearly impossible. Pain wakes you up from deep sleep. Physical signs will be obvious: pupillary dilation; increased sweating; goosebumps; brisk reflexes; cold, clammy hands and feet; nausea, vomiting or dry heaves; loss of appetite; significant sleep disturbance with inability to fall asleep or to remain asleep. When persistent, significant weight loss is observed due to the complete loss of appetite and sleep  deprivation.  Blood pressure and heart rate becomes significantly elevated. Caution: If elevated blood pressure triggers a pounding headache associated with blurred vision, then the patient should immediately seek attention at an urgent or emergency care unit, as these may be signs of an impending stroke.    Emergency Department Pain Levels (6-10/10)  Emergency Room Pain 6 Severely limiting. Requires emergency care and should not be seen or managed at an outpatient pain management facility. Communication becomes difficult and requires great effort. Assistance to reach the emergency department may be required. Facial flushing and profuse sweating along with potentially dangerous increases in heart rate and blood pressure will be evident.   Distressing pain 7 Self-care is very difficult. Assistance is required to transport, or use restroom. Assistance to reach the emergency department will be required. Tasks requiring coordination, such as bathing and getting dressed become very difficult.   Disabling pain 8 Self-care is no longer possible. At this level, pain is disabling. The individual is unable to do even the most "basic" activities such as walking, eating, bathing, dressing, transferring to a bed, or toileting. Fine motor skills are lost. It is difficult to think clearly.   Incapacitating pain 9 Pain becomes incapacitating. Thought processing is no longer possible. Difficult to remember your own name. Control of movement and coordination are lost.   The worst pain imaginable 10 At this level, most patients pass out from pain. When this level is reached, collapse of the autonomic nervous system occurs, leading to a sudden drop in blood pressure and heart rate. This in turn results in a temporary and dramatic drop in blood flow to the brain, leading to a loss of consciousness. Fainting is one of the body's self defense mechanisms. Passing out puts the brain in a calmed state and causes it to shut down  for a while, in order to begin the healing process.    Summary: 1. Refer to this scale when providing [place] with your pain level. 2. Be accurate and careful when reporting your pain level. This will help with your care. 3. Over-reporting your pain level will lead to loss of credibility. 4.  Even a level of 1/10 means that there is pain and will be treated at our facility. 5. High, inaccurate reporting will be documented as "Symptom Exaggeration", leading to loss of credibility and suspicions of possible secondary gains such as obtaining more narcotics, or wanting to appear disabled, for fraudulent reasons. 6. Only pain levels of 5 or below will be seen at our facility. 7. Pain levels of 6 and above will be sent to the Emergency Department and the appointment cancelled. ____________________________________________________________________________________________    BMI Assessment: Estimated body mass index is 47.65 kg/m as calculated from the following:   Height as of this encounter: 5' 3"  (1.6 m).   Weight as of this encounter: 269 lb (122 kg).  BMI interpretation table: BMI level Category Range association with higher incidence of chronic pain  <18 kg/m2 Underweight   18.5-24.9 kg/m2 Ideal body weight   25-29.9 kg/m2 Overweight Increased incidence by 20%  30-34.9 kg/m2 Obese (Class I) Increased incidence by 68%  35-39.9 kg/m2 Severe obesity (Class II) Increased incidence by 136%  >40 kg/m2 Extreme obesity (Class III) Increased incidence by 254%   BMI Readings from Last 4 Encounters:  05/26/17 47.65 kg/m  02/11/17 46.41 kg/m  12/31/16 46.46 kg/m  08/26/16 44.46 kg/m   Wt Readings from Last 4 Encounters:  05/26/17 269 lb (122 kg)  02/11/17 262 lb (118.8 kg)  12/31/16 254 lb (115.2 kg)  08/26/16 259 lb (117.5 kg)

## 2017-05-27 ENCOUNTER — Encounter: Payer: Self-pay | Admitting: Nurse Practitioner

## 2017-05-27 ENCOUNTER — Other Ambulatory Visit: Payer: Self-pay | Admitting: Nurse Practitioner

## 2017-05-27 DIAGNOSIS — R7982 Elevated C-reactive protein (CRP): Secondary | ICD-10-CM

## 2017-05-27 DIAGNOSIS — R700 Elevated erythrocyte sedimentation rate: Secondary | ICD-10-CM

## 2017-05-30 LAB — COMP. METABOLIC PANEL (12)
AST: 15 IU/L (ref 0–40)
Albumin/Globulin Ratio: 1.5 (ref 1.2–2.2)
Albumin: 4.3 g/dL (ref 3.5–5.5)
Alkaline Phosphatase: 86 IU/L (ref 39–117)
BUN/Creatinine Ratio: 15 (ref 9–23)
BUN: 10 mg/dL (ref 6–20)
Bilirubin Total: <0.2 mg/dL (ref 0.0–1.2)
Calcium: 9.3 mg/dL (ref 8.7–10.2)
Chloride: 102 mmol/L (ref 96–106)
Creatinine, Ser: 0.66 mg/dL (ref 0.57–1.00)
GFR calc Af Amer: 138 mL/min/{1.73_m2} (ref >59)
GFR calc non Af Amer: 120 mL/min/{1.73_m2} (ref >59)
Globulin, Total: 2.9 g/dL (ref 1.5–4.5)
Glucose: 85 mg/dL (ref 65–99)
Potassium: 4.2 mmol/L (ref 3.5–5.2)
Sodium: 139 mmol/L (ref 134–144)
Total Protein: 7.2 g/dL (ref 6.0–8.5)

## 2017-05-30 LAB — SEDIMENTATION RATE: Sed Rate: 50 mm/hr — ABNORMAL HIGH (ref 0–32)

## 2017-05-30 LAB — MAGNESIUM: Magnesium: 1.9 mg/dL (ref 1.6–2.3)

## 2017-05-30 LAB — 25-HYDROXY VITAMIN D LCMS D2+D3
25-Hydroxy, Vitamin D-2: <1 ng/mL
25-Hydroxy, Vitamin D-3: 23 ng/mL
25-Hydroxy, Vitamin D: 23 ng/mL — ABNORMAL LOW

## 2017-05-30 LAB — C-REACTIVE PROTEIN: CRP: 8.2 mg/L — ABNORMAL HIGH (ref 0.0–4.9)

## 2017-05-30 LAB — VITAMIN B12: Vitamin B-12: 373 pg/mL (ref 232–1245)

## 2017-06-01 LAB — COMPLIANCE DRUG ANALYSIS, UR

## 2017-06-04 DIAGNOSIS — H66003 Acute suppurative otitis media without spontaneous rupture of ear drum, bilateral: Secondary | ICD-10-CM | POA: Diagnosis not present

## 2017-06-04 DIAGNOSIS — E669 Obesity, unspecified: Secondary | ICD-10-CM | POA: Diagnosis not present

## 2017-06-10 ENCOUNTER — Ambulatory Visit: Payer: Medicare Other | Admitting: Physical Therapy

## 2017-06-12 ENCOUNTER — Ambulatory Visit: Payer: Medicare Other | Admitting: Physical Therapy

## 2017-06-17 ENCOUNTER — Ambulatory Visit: Payer: Medicare Other | Admitting: Physical Therapy

## 2017-06-18 LAB — SPECIMEN STATUS REPORT

## 2017-06-18 LAB — ANA W/REFLEX IF POSITIVE: Anti Nuclear Antibody(ANA): NEGATIVE

## 2017-06-18 LAB — RHEUMATOID FACTOR: Rhuematoid fact SerPl-aCnc: <10 IU/mL (ref 0.0–13.9)

## 2017-06-23 DIAGNOSIS — F902 Attention-deficit hyperactivity disorder, combined type: Secondary | ICD-10-CM | POA: Diagnosis not present

## 2017-06-23 DIAGNOSIS — F4310 Post-traumatic stress disorder, unspecified: Secondary | ICD-10-CM | POA: Diagnosis not present

## 2017-06-25 ENCOUNTER — Ambulatory Visit: Payer: Medicare Other | Admitting: Physical Therapy

## 2017-07-01 ENCOUNTER — Ambulatory Visit: Payer: Medicare Other | Admitting: Physical Therapy

## 2017-07-03 ENCOUNTER — Ambulatory Visit: Payer: Medicare Other | Admitting: Physical Therapy

## 2017-07-03 ENCOUNTER — Ambulatory Visit: Payer: Medicare Other | Attending: Family Medicine | Admitting: Physical Therapy

## 2017-07-03 ENCOUNTER — Encounter: Payer: Self-pay | Admitting: Physical Therapy

## 2017-07-03 DIAGNOSIS — M25562 Pain in left knee: Secondary | ICD-10-CM | POA: Insufficient documentation

## 2017-07-03 DIAGNOSIS — G8929 Other chronic pain: Secondary | ICD-10-CM | POA: Insufficient documentation
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
## 2017-07-04 DIAGNOSIS — F410 Panic disorder [episodic paroxysmal anxiety] without agoraphobia: Secondary | ICD-10-CM | POA: Diagnosis not present

## 2017-07-04 DIAGNOSIS — Z79899 Other long term (current) drug therapy: Secondary | ICD-10-CM | POA: Diagnosis not present

## 2017-07-08 ENCOUNTER — Ambulatory Visit: Payer: Medicare Other | Attending: Family Medicine | Admitting: Physical Therapy

## 2017-07-11 ENCOUNTER — Ambulatory Visit: Payer: Medicare Other | Admitting: Physical Therapy

## 2017-07-16 ENCOUNTER — Encounter: Payer: Self-pay | Admitting: Pain Medicine

## 2017-07-16 ENCOUNTER — Ambulatory Visit: Payer: Medicare Other | Attending: Pain Medicine | Admitting: Pain Medicine

## 2017-07-16 ENCOUNTER — Other Ambulatory Visit: Payer: Self-pay

## 2017-07-16 VITALS — BP 144/85 | HR 116 | Temp 98.2°F | Resp 18 | Ht 63.0 in | Wt 272.0 lb

## 2017-07-16 DIAGNOSIS — F410 Panic disorder [episodic paroxysmal anxiety] without agoraphobia: Secondary | ICD-10-CM | POA: Insufficient documentation

## 2017-07-16 DIAGNOSIS — F319 Bipolar disorder, unspecified: Secondary | ICD-10-CM | POA: Insufficient documentation

## 2017-07-16 DIAGNOSIS — M25562 Pain in left knee: Secondary | ICD-10-CM | POA: Insufficient documentation

## 2017-07-16 DIAGNOSIS — M5136 Other intervertebral disc degeneration, lumbar region: Secondary | ICD-10-CM | POA: Diagnosis not present

## 2017-07-16 DIAGNOSIS — Z888 Allergy status to other drugs, medicaments and biological substances status: Secondary | ICD-10-CM | POA: Diagnosis not present

## 2017-07-16 DIAGNOSIS — Z96641 Presence of right artificial hip joint: Secondary | ICD-10-CM | POA: Insufficient documentation

## 2017-07-16 DIAGNOSIS — M79641 Pain in right hand: Secondary | ICD-10-CM | POA: Diagnosis not present

## 2017-07-16 DIAGNOSIS — M79642 Pain in left hand: Secondary | ICD-10-CM | POA: Insufficient documentation

## 2017-07-16 DIAGNOSIS — T7612XA Child physical abuse, suspected, initial encounter: Secondary | ICD-10-CM | POA: Insufficient documentation

## 2017-07-16 DIAGNOSIS — G8929 Other chronic pain: Secondary | ICD-10-CM | POA: Diagnosis not present

## 2017-07-16 DIAGNOSIS — E559 Vitamin D deficiency, unspecified: Secondary | ICD-10-CM | POA: Insufficient documentation

## 2017-07-16 DIAGNOSIS — F17210 Nicotine dependence, cigarettes, uncomplicated: Secondary | ICD-10-CM | POA: Diagnosis not present

## 2017-07-16 DIAGNOSIS — Z79899 Other long term (current) drug therapy: Secondary | ICD-10-CM | POA: Diagnosis not present

## 2017-07-16 DIAGNOSIS — M533 Sacrococcygeal disorders, not elsewhere classified: Secondary | ICD-10-CM

## 2017-07-16 DIAGNOSIS — F419 Anxiety disorder, unspecified: Secondary | ICD-10-CM | POA: Insufficient documentation

## 2017-07-16 DIAGNOSIS — M67449 Ganglion, unspecified hand: Secondary | ICD-10-CM | POA: Insufficient documentation

## 2017-07-16 DIAGNOSIS — G5603 Carpal tunnel syndrome, bilateral upper limbs: Secondary | ICD-10-CM | POA: Insufficient documentation

## 2017-07-16 DIAGNOSIS — G894 Chronic pain syndrome: Secondary | ICD-10-CM | POA: Insufficient documentation

## 2017-07-16 MED ORDER — VITAMIN D3 125 MCG (5000 UT) PO CAPS: 1.0000 | ORAL_CAPSULE | Freq: Every day | ORAL | 5 refills | Status: AC

## 2017-07-16 MED ORDER — GNP CALCIUM 1200 1200-1000 MG-UNIT PO CHEW
1200.0000 mg | CHEWABLE_TABLET | Freq: Every day | ORAL | 5 refills | Status: DC
Start: 2017-07-16 — End: 2020-02-02

## 2017-07-16 MED ORDER — ERGOCALCIFEROL 1.25 MG (50000 UT) PO CAPS: 50000.0000 [IU] | ORAL_CAPSULE | ORAL | 0 refills | Status: AC

## 2017-07-16 MED ORDER — MAGNESIUM 500 MG PO CAPS: 500.0000 mg | ORAL_CAPSULE | Freq: Two times a day (BID) | ORAL | 5 refills | Status: AC

## 2017-07-16 NOTE — Progress Notes (Signed)
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 Miller, Chelsea, PT [facility] None    Primary Care Physician: Soles, Meredith Key, MD Location: [facility] Note by:  A , MD Date: 07/16/2017; Time: 12:25 PM 

## 2017-07-16 NOTE — Progress Notes (Signed)
Safety precautions to be maintained throughout the outpatient stay will include: orient to surroundings, keep bed in low position, maintain call bell within reach at all times, provide assistance with transfer out of bed and ambulation.  

## 2017-07-16 NOTE — Patient Instructions (Signed)
____________________________________________________________________________________________  Preparing for your procedure (without sedation)  Instructions: . Oral Intake: Do not eat or drink anything for at least 3 hours prior to your procedure. . Transportation: Unless otherwise stated by your physician, you may drive yourself after the procedure. . Blood Pressure Medicine: Take your blood pressure medicine with a sip of water the morning of the procedure. . Blood thinners:  . Diabetics on insulin: Notify the staff so that you can be scheduled 1st case in the morning. If your diabetes requires high dose insulin, take only  of your normal insulin dose the morning of the procedure and notify the staff that you have done so. . Preventing infections: Shower with an antibacterial soap the morning of your procedure.  . Build-up your immune system: Take 1000 mg of Vitamin C with every meal (3 times a day) the day prior to your procedure. . Antibiotics: Inform the staff if you have a condition or reason that requires you to take antibiotics before dental procedures. . Pregnancy: If you are pregnant, call and cancel the procedure. . Sickness: If you have a cold, fever, or any active infections, call and cancel the procedure. . Arrival: You must be in the facility at least 30 minutes prior to your scheduled procedure. . Children: Do not bring any children with you. . Dress appropriately: Bring dark clothing that you would not mind if they get stained. . Valuables: Do not bring any jewelry or valuables.  Procedure appointments are reserved for interventional treatments only. . No Prescription Refills. . No medication changes will be discussed during procedure appointments. . No disability issues will be discussed.  Remember:  Regular Business hours are:  Monday to Thursday 8:00 AM to 4:00 PM  Provider's Schedule: Asjia Berrios, MD:  Procedure days: Tuesday and Thursday 7:30 AM to 4:00  PM  Bilal Lateef, MD:  Procedure days: Monday and Wednesday 7:30 AM to 4:00 PM ____________________________________________________________________________________________    

## 2017-07-21 ENCOUNTER — Encounter: Payer: Medicare Other | Admitting: Physical Therapy

## 2017-07-21 ENCOUNTER — Ambulatory Visit: Payer: Medicare Other | Admitting: Physical Therapy

## 2017-07-23 ENCOUNTER — Encounter: Payer: Medicare Other | Admitting: Physical Therapy

## 2017-07-23 DIAGNOSIS — J302 Other seasonal allergic rhinitis: Secondary | ICD-10-CM | POA: Diagnosis not present

## 2017-07-24 ENCOUNTER — Other Ambulatory Visit: Payer: Self-pay

## 2017-07-24 ENCOUNTER — Ambulatory Visit: Payer: Medicare Other | Attending: Pain Medicine | Admitting: Pain Medicine

## 2017-07-24 ENCOUNTER — Encounter: Payer: Self-pay | Admitting: Pain Medicine

## 2017-07-24 VITALS — BP 142/88 | HR 118 | Temp 98.2°F | Resp 18 | Ht 63.0 in | Wt 270.0 lb

## 2017-07-24 DIAGNOSIS — M25562 Pain in left knee: Secondary | ICD-10-CM

## 2017-07-24 DIAGNOSIS — Z888 Allergy status to other drugs, medicaments and biological substances status: Secondary | ICD-10-CM | POA: Diagnosis not present

## 2017-07-24 DIAGNOSIS — G8929 Other chronic pain: Secondary | ICD-10-CM | POA: Diagnosis not present

## 2017-07-24 DIAGNOSIS — M1712 Unilateral primary osteoarthritis, left knee: Secondary | ICD-10-CM | POA: Diagnosis not present

## 2017-07-24 MED ORDER — METHYLPREDNISOLONE ACETATE 80 MG/ML IJ SUSP
INTRAMUSCULAR | Status: AC
  Filled 2017-07-24: qty 1

## 2017-07-24 MED ORDER — METHYLPREDNISOLONE ACETATE 80 MG/ML IJ SUSP
80.0000 mg | Freq: Once | INTRAMUSCULAR | Status: AC
  Administered 2017-07-24: 80 mg via INTRA_ARTICULAR

## 2017-07-24 MED ORDER — ROPIVACAINE HCL 2 MG/ML IJ SOLN
INTRAMUSCULAR | Status: AC
  Filled 2017-07-24: qty 10

## 2017-07-24 MED ORDER — ROPIVACAINE HCL 2 MG/ML IJ SOLN
2.0000 mL | Freq: Once | INTRAMUSCULAR | Status: AC
  Administered 2017-07-24: 10 mL via INTRA_ARTICULAR

## 2017-07-24 MED ORDER — LIDOCAINE HCL (PF) 1 % IJ SOLN
INTRAMUSCULAR | Status: AC
  Filled 2017-07-24: qty 5

## 2017-07-24 MED ORDER — LIDOCAINE HCL (PF) 1 % IJ SOLN
5.0000 mL | Freq: Once | INTRAMUSCULAR | Status: AC
  Administered 2017-07-24: 5 mL

## 2017-07-24 NOTE — Progress Notes (Signed)
Patient's Name: Suzanne Moses  MRN: [id number]  Referring Provider: Herminio Commons, MD  DOB: 11-16-1985  PCP: Herminio Commons, MD  DOS: 07/24/2017  Note by: Gaspar Cola, MD  Service setting: Ambulatory outpatient  Specialty: Interventional Pain Management  Patient type: Established  Location: [facility] (AM[facility]) [facility] Facility  Visit type: Interventional Procedure   Primary Reason for Visit: Interventional Pain Management Treatment. CC: Knee Pain (left)  Procedure:  Anesthesia, Analgesia, Anxiolysis:  Type: Diagnostic Intra-Articular Local anesthetic and steroid Knee Injection #1  Region: Lateral infrapatellar Knee Region Level: Knee Joint Laterality: Left knee  Type: Local Anesthesia Indication(s): Analgesia         Local Anesthetic: Lidocaine 1-2% Route: Infiltration ([place]/IM) IV Access: Declined Sedation: Declined    Indications: 1. Osteoarthritis of knee (Left)   2. Chronic knee pain (Primary Area of Pain) (Left)    Pain Score: Pre-procedure: 6 /10 Post-procedure: 0-No pain/10  Pre-op Assessment:  Suzanne Moses is a 32 y.o. (year old), female patient, seen today for interventional treatment. She  has a past surgical history that includes Tonsillectomy; Ovary surgery; and Unilateral salpingectomy (Left, 2013). Suzanne Moses has a current medication list which includes the following prescription(s): alprazolam, amphetamine-dextroamphetamine, gnp calcium 1200, vitamin d3, ergocalciferol, etonogestrel, ibuprofen, magnesium, permethrin, and ziprasidone, and the following Facility-Administered Medications: lidocaine (pf), methylprednisolone acetate, and ropivacaine (pf) 2 mg/ml (0.2%). Her primarily concern today is the Knee Pain (left)  Initial Vital Signs:  Pulse/HCG Rate: (!) 118ECG Heart Rate: (!) 101 Temp: 98.2 F (36.8 C) Resp: 18 BP: (!) 144/72 SpO2: 99 %  BMI: Estimated body mass index is 47.83 kg/m as calculated from the following:   Height as of this  encounter: 5\' 3"  (1.6 m).   Weight as of this encounter: 270 lb (122.5 kg).  Risk Assessment: Allergies: Reviewed. She is allergic to zofran [ondansetron hcl].  Allergy Precautions: None required Coagulopathies: Reviewed. None identified.  Blood-thinner therapy: None at this time Active Infection(s): Reviewed. None identified. Suzanne Moses is afebrile  Site Confirmation: Suzanne Moses was asked to confirm the procedure and laterality before marking the site Procedure checklist: Completed Consent: Before the procedure and under the influence of no sedative(s), amnesic(s), or anxiolytics, the patient was informed of the treatment options, risks and possible complications. To fulfill our ethical and legal obligations, as recommended by the American Medical Association's Code of Ethics, I have informed the patient of my clinical impression; the nature and purpose of the treatment or procedure; the risks, benefits, and possible complications of the intervention; the alternatives, including doing nothing; the risk(s) and benefit(s) of the alternative treatment(s) or procedure(s); and the risk(s) and benefit(s) of doing nothing. The patient was provided information about the general risks and possible complications associated with the procedure. These may include, but are not limited to: failure to achieve desired goals, infection, bleeding, organ or nerve damage, allergic reactions, paralysis, and death. In addition, the patient was informed of those risks and complications associated to the procedure, such as failure to decrease pain; infection; bleeding; organ or nerve damage with subsequent damage to sensory, motor, and/or autonomic systems, resulting in permanent pain, numbness, and/or weakness of one or several areas of the body; allergic reactions; (i.e.: anaphylactic reaction); and/or death. Furthermore, the patient was informed of those risks and complications associated with the medications. These include,  but are not limited to: allergic reactions (i.e.: anaphylactic or anaphylactoid reaction(s)); adrenal axis suppression; blood sugar elevation that in diabetics may result in ketoacidosis or  comma; water retention that in patients with history of congestive heart failure may result in shortness of breath, pulmonary edema, and decompensation with resultant heart failure; weight gain; swelling or edema; medication-induced neural toxicity; particulate matter embolism and blood vessel occlusion with resultant organ, and/or nervous system infarction; and/or aseptic necrosis of one or more joints. Finally, the patient was informed that Medicine is not an exact science; therefore, there is also the possibility of unforeseen or unpredictable risks and/or possible complications that may result in a catastrophic outcome. The patient indicated having understood very clearly. We have given the patient no guarantees and we have made no promises. Enough time was given to the patient to ask questions, all of which were answered to the patient's satisfaction. Suzanne Moses has indicated that she wanted to continue with the procedure. Attestation: I, the ordering provider, attest that I have discussed with the patient the benefits, risks, side-effects, alternatives, likelihood of achieving goals, and potential problems during recovery for the procedure that I have provided informed consent. Date  Time: 07/24/2017  1:46 PM  Pre-Procedure Preparation:  Monitoring: As per clinic protocol. Respiration, ETCO2, SpO2, BP, heart rate and rhythm monitor placed and checked for adequate function Safety Precautions: Patient was assessed for positional comfort and pressure points before starting the procedure. Time-out: I initiated and conducted the "Time-out" before starting the procedure, as per protocol. The patient was asked to participate by confirming the accuracy of the "Time Out" information. Verification of the correct person, site,  and procedure were performed and confirmed by me, the nursing staff, and the patient. "Time-out" conducted as per Joint Commission's Universal Protocol (UP.01.01.01). Time: 1413  Description of Procedure:       Position: Sitting Target Area: Knee Joint Approach: Just above the Lateral tibial plateau, lateral to the infrapatellar tendon. Area Prepped: Entire knee area, from the mid-thigh to the mid-shin. Prepping solution: ChloraPrep (2% chlorhexidine gluconate and 70% isopropyl alcohol) Safety Precautions: Aspiration looking for blood return was conducted prior to all injections. At no point did we inject any substances, as a needle was being advanced. No attempts were made at seeking any paresthesias. Safe injection practices and needle disposal techniques used. Medications properly checked for expiration dates. SDV (single dose vial) medications used. Description of the Procedure: Protocol guidelines were followed. The patient was placed in position over the fluoroscopy table. The target area was identified and the area prepped in the usual manner. Skin desensitized using vapocoolant spray. Skin & deeper tissues infiltrated with local anesthetic. Appropriate amount of time allowed to pass for local anesthetics to take effect. The procedure needles were then advanced to the target area. Proper needle placement secured. Negative aspiration confirmed. Solution injected in intermittent fashion, asking for systemic symptoms every 0.5cc of injectate. The needles were then removed and the area cleansed, making sure to leave some of the prepping solution back to take advantage of its long term bactericidal properties. Vitals:   07/24/17 1345 07/24/17 1416  BP: (!) 144/72 (!) 142/88  Pulse: (!) 118   Resp: 18 18  Temp: 98.2 F (36.8 C)   TempSrc: Oral   SpO2: 99% 98%  Weight: 270 lb (122.5 kg)   Height: 5\' 3"  (1.6 m)     Start Time: 1413 hrs. End Time: 1414 hrs. Materials:  Needle(s) Type: Regular  needle Gauge: 22G Length: 3.5-in Medication(s): Please see orders for medications and dosing details.  Imaging Guidance:  Type of Imaging Technique: None used Indication(s): N/A Exposure Time: No patient  exposure Contrast: None used. Fluoroscopic Guidance: N/A Ultrasound Guidance: N/A Interpretation: N/A  Antibiotic Prophylaxis:   Anti-infectives (From admission, onward)   None     Indication(s): None identified  Post-operative Assessment:  Post-procedure Vital Signs:  Pulse/HCG Rate: (!) 118(!) 101 Temp: 98.2 F (36.8 C) Resp: 18 BP: (!) 142/88 SpO2: 98 %  EBL: None  Complications: No immediate post-treatment complications observed by team, or reported by patient.  Note: The patient tolerated the entire procedure well. A repeat set of vitals were taken after the procedure and the patient was kept under observation following institutional policy, for this type of procedure. Post-procedural neurological assessment was performed, showing return to baseline, prior to discharge. The patient was provided with post-procedure discharge instructions, including a section on how to identify potential problems. Should any problems arise concerning this procedure, the patient was given instructions to immediately contact us, at any time, without hesitation. In any case, we plan to contact the patient by telephone for a follow-up status report regarding this interventional procedure.  Comments:  No additional relevant information.  Plan of Care   Imaging Orders  No imaging studies ordered today    Procedure Orders     KNEE INJECTION  Medications ordered for procedure: Meds ordered this encounter  Medications  . lidocaine (PF) (XYLOCAINE) 1 % injection 5 mL  . ropivacaine (PF) 2 mg/mL (0.2%) (NAROPIN) injection 2 mL  . methylPREDNISolone acetate (DEPO-MEDROL) injection 80 mg   Medications administered: Macy E. Rama "Benjamine Mola" had no medications administered during this  visit.  See the medical record for exact dosing, route, and time of administration.  New Prescriptions   No medications on file   Disposition: Discharge home  Discharge Date & Time: 07/24/2017; 1417 hrs.   Physician-requested Follow-up: Return for post-procedure eval (2 wks), w/ Dr. Dossie Arbour.  No future appointments. Primary Care Physician: Herminio Commons, MD Location: [facility] Note by: Gaspar Cola, MD Date: 07/24/2017; Time: 2:17 PM  Disclaimer:  Medicine is not an Chief Strategy Officer. The only guarantee in medicine is that nothing is guaranteed. It is important to note that the decision to proceed with this intervention was based on the information collected from the patient. The Data and conclusions were drawn from the patient's questionnaire, the interview, and the physical examination. Because the information was provided in large part by the patient, it cannot be guaranteed that it has not been purposely or unconsciously manipulated. Every effort has been made to obtain as much relevant data as possible for this evaluation. It is important to note that the conclusions that lead to this procedure are derived in large part from the available data. Always take into account that the treatment will also be dependent on availability of resources and existing treatment guidelines, considered by other Pain Management Practitioners as being common knowledge and practice, at the time of the intervention. For Medico-Legal purposes, it is also important to point out that variation in procedural techniques and pharmacological choices are the acceptable norm. The indications, contraindications, technique, and results of the above procedure should only be interpreted and judged by a Board-Certified Interventional Pain Specialist with extensive familiarity and expertise in the same exact procedure and technique.

## 2017-07-24 NOTE — Patient Instructions (Signed)

## 2017-07-24 NOTE — Addendum Note (Signed)
Addended by: Janett Billow on: 07/24/2017 02:53 PM   Modules accepted: Orders

## 2017-07-24 NOTE — Progress Notes (Signed)
Safety precautions to be maintained throughout the outpatient stay will include: orient to surroundings, keep bed in low position, maintain call bell within reach at all times, provide assistance with transfer out of bed and ambulation.  

## 2017-07-25 ENCOUNTER — Telehealth: Payer: Self-pay

## 2017-07-25 DIAGNOSIS — J020 Streptococcal pharyngitis: Secondary | ICD-10-CM | POA: Insufficient documentation

## 2017-07-25 DIAGNOSIS — R070 Pain in throat: Secondary | ICD-10-CM | POA: Diagnosis present

## 2017-07-25 DIAGNOSIS — Z79899 Other long term (current) drug therapy: Secondary | ICD-10-CM | POA: Insufficient documentation

## 2017-07-25 DIAGNOSIS — F17210 Nicotine dependence, cigarettes, uncomplicated: Secondary | ICD-10-CM | POA: Insufficient documentation

## 2017-07-25 DIAGNOSIS — R1310 Dysphagia, unspecified: Secondary | ICD-10-CM | POA: Diagnosis not present

## 2017-07-25 DIAGNOSIS — R0981 Nasal congestion: Secondary | ICD-10-CM | POA: Diagnosis not present

## 2017-07-25 NOTE — Telephone Encounter (Signed)
Post procedure phone call.   No answer.  

## 2017-07-26 ENCOUNTER — Emergency Department
Admission: EM | Admit: 2017-07-26 | Discharge: 2017-07-26 | Disposition: A | Discharge status: Home / Self Care | Payer: Medicare Other | Attending: Emergency Medicine | Admitting: Emergency Medicine

## 2017-07-26 ENCOUNTER — Encounter: Payer: Self-pay | Admitting: Emergency Medicine

## 2017-07-26 ENCOUNTER — Other Ambulatory Visit: Payer: Self-pay

## 2017-07-26 DIAGNOSIS — J029 Acute pharyngitis, unspecified: Secondary | ICD-10-CM

## 2017-07-26 DIAGNOSIS — J020 Streptococcal pharyngitis: Secondary | ICD-10-CM

## 2017-07-26 LAB — GROUP A STREP BY PCR: Group A Strep by PCR: DETECTED — AB

## 2017-07-26 MED ORDER — AMOXICILLIN 500 MG PO CAPS
500.0000 mg | ORAL_CAPSULE | Freq: Once | ORAL | Status: AC
  Administered 2017-07-26: 500 mg via ORAL
  Filled 2017-07-26: qty 1

## 2017-07-26 MED ORDER — HYDROCODONE-ACETAMINOPHEN 7.5-325 MG/15ML PO SOLN
10.0000 mL | Freq: Once | ORAL | Status: AC
  Administered 2017-07-26: 10 mL via ORAL
  Filled 2017-07-26: qty 15

## 2017-07-26 MED ORDER — AMOXICILLIN 500 MG PO CAPS: 500.0000 mg | ORAL_CAPSULE | Freq: Three times a day (TID) | ORAL | 0 refills | Status: DC

## 2017-07-26 MED ORDER — HYDROCODONE-ACETAMINOPHEN 7.5-325 MG/15ML PO SOLN: 10.0000 mL | Freq: Four times a day (QID) | ORAL | 0 refills | Status: DC | PRN

## 2017-07-26 NOTE — ED Triage Notes (Signed)
Pt c/o sore throat and nasal congestion since Tuesday. Red and swollen tonsils noted on assessment.
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
## 2017-07-26 NOTE — ED Notes (Signed)
ED Provider at bedside. 

## 2017-07-26 NOTE — Discharge Instructions (Signed)
1.  Take antibiotic as prescribed (Amoxicillin 500 mg 3 times daily for 7 days). 2.  You may take Lortab elixir as needed for throat pain. 3.  Return to the ER for worsening symptoms, persistent vomiting, difficulty breathing or other concerns.

## 2017-07-29 ENCOUNTER — Encounter: Payer: Medicare Other | Admitting: Physical Therapy

## 2017-07-31 ENCOUNTER — Telehealth: Payer: Self-pay | Admitting: Pain Medicine

## 2017-07-31 NOTE — Telephone Encounter (Signed)
Having a lot of pain since procedure and is keeping her up at night, please call patient, her follow up isnt until May 15

## 2017-07-31 NOTE — Telephone Encounter (Signed)
Attempted to call patient, message left. 

## 2017-08-04 NOTE — Telephone Encounter (Signed)
Having increased pain in knee after steroid injection. Denies redness, swelling, drainage. Offered appointment with NP. Has follow-up appt on May 15. Will discuss at that appt.

## 2017-08-05 ENCOUNTER — Encounter: Payer: Medicare Other | Admitting: Physical Therapy

## 2017-08-07 ENCOUNTER — Encounter: Payer: Medicare Other | Admitting: Physical Therapy

## 2017-08-11 ENCOUNTER — Encounter: Payer: Medicare Other | Admitting: Physical Therapy

## 2017-08-14 ENCOUNTER — Encounter: Payer: Medicare Other | Admitting: Physical Therapy
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
## 2017-08-20 ENCOUNTER — Ambulatory Visit: Payer: Medicare Other | Attending: Pain Medicine | Admitting: Pain Medicine

## 2017-08-20 ENCOUNTER — Other Ambulatory Visit: Payer: Self-pay

## 2017-08-20 ENCOUNTER — Encounter: Payer: Self-pay | Admitting: Pain Medicine

## 2017-08-20 VITALS — BP 143/77 | HR 107 | Temp 97.8°F | Ht 62.0 in | Wt 258.0 lb

## 2017-08-20 DIAGNOSIS — G8929 Other chronic pain: Secondary | ICD-10-CM | POA: Diagnosis not present

## 2017-08-20 DIAGNOSIS — R7982 Elevated C-reactive protein (CRP): Secondary | ICD-10-CM | POA: Diagnosis not present

## 2017-08-20 DIAGNOSIS — M79641 Pain in right hand: Secondary | ICD-10-CM | POA: Insufficient documentation

## 2017-08-20 DIAGNOSIS — M792 Neuralgia and neuritis, unspecified: Secondary | ICD-10-CM | POA: Diagnosis not present

## 2017-08-20 DIAGNOSIS — M7652 Patellar tendinitis, left knee: Secondary | ICD-10-CM | POA: Insufficient documentation

## 2017-08-20 DIAGNOSIS — G5603 Carpal tunnel syndrome, bilateral upper limbs: Secondary | ICD-10-CM | POA: Diagnosis not present

## 2017-08-20 DIAGNOSIS — M25562 Pain in left knee: Secondary | ICD-10-CM | POA: Diagnosis not present

## 2017-08-20 DIAGNOSIS — F17210 Nicotine dependence, cigarettes, uncomplicated: Secondary | ICD-10-CM | POA: Insufficient documentation

## 2017-08-20 DIAGNOSIS — F419 Anxiety disorder, unspecified: Secondary | ICD-10-CM | POA: Diagnosis not present

## 2017-08-20 DIAGNOSIS — E559 Vitamin D deficiency, unspecified: Secondary | ICD-10-CM | POA: Diagnosis not present

## 2017-08-20 DIAGNOSIS — M1712 Unilateral primary osteoarthritis, left knee: Secondary | ICD-10-CM | POA: Diagnosis not present

## 2017-08-20 DIAGNOSIS — Z79899 Other long term (current) drug therapy: Secondary | ICD-10-CM | POA: Diagnosis not present

## 2017-08-20 DIAGNOSIS — G894 Chronic pain syndrome: Secondary | ICD-10-CM | POA: Diagnosis not present

## 2017-08-20 DIAGNOSIS — F319 Bipolar disorder, unspecified: Secondary | ICD-10-CM | POA: Insufficient documentation

## 2017-08-20 DIAGNOSIS — Z96641 Presence of right artificial hip joint: Secondary | ICD-10-CM | POA: Insufficient documentation

## 2017-08-20 DIAGNOSIS — M5136 Other intervertebral disc degeneration, lumbar region: Secondary | ICD-10-CM | POA: Insufficient documentation

## 2017-08-20 DIAGNOSIS — M79642 Pain in left hand: Secondary | ICD-10-CM | POA: Diagnosis not present

## 2017-08-20 DIAGNOSIS — M542 Cervicalgia: Secondary | ICD-10-CM | POA: Diagnosis present

## 2017-08-20 MED ORDER — GABAPENTIN 100 MG PO CAPS: 100.0000 mg | ORAL_CAPSULE | Freq: Every day | ORAL | 0 refills | Status: DC

## 2017-08-20 NOTE — Patient Instructions (Addendum)
____________________________________________________________________________________________  Pain Scale  Introduction: The pain score used by this practice is the Verbal Numerical Rating Scale (VNRS-11). This is an 11-point scale. It is for adults and children 10 years or older. There are significant differences in how the pain score is reported, used, and applied. Forget everything you learned in the past and learn this scoring system.  General Information: The scale should reflect your current level of pain. Unless you are specifically asked for the level of your worst pain, or your average pain. If you are asked for one of these two, then it should be understood that it is over the past 24 hours.  Basic Activities of Daily Living (ADL): Personal hygiene, dressing, eating, transferring, and using restroom.  Instructions: Most patients tend to report their level of pain as a combination of two factors, their physical pain and their psychosocial pain. This last one is also known as "suffering" and it is reflection of how physical pain affects you socially and psychologically. From now on, report them separately. From this point on, when asked to report your pain level, report only your physical pain. Use the following table for reference.  Pain Clinic Pain Levels (0-5/10)  Pain Level Score  Description  No Pain 0   Mild pain 1 Nagging, annoying, but does not interfere with basic activities of daily living (ADL). Patients are able to eat, bathe, get dressed, toileting (being able to get on and off the toilet and perform personal hygiene functions), transfer (move in and out of bed or a chair without assistance), and maintain continence (able to control bladder and bowel functions). Blood pressure and heart rate are unaffected. A normal heart rate for a healthy adult ranges from 60 to 100 bpm (beats per minute).   Mild to moderate pain 2 Noticeable and distracting. Impossible to hide from other  people. More frequent flare-ups. Still possible to adapt and function close to normal. It can be very annoying and may have occasional stronger flare-ups. With discipline, patients may get used to it and adapt.   Moderate pain 3 Interferes significantly with activities of daily living (ADL). It becomes difficult to feed, bathe, get dressed, get on and off the toilet or to perform personal hygiene functions. Difficult to get in and out of bed or a chair without assistance. Very distracting. With effort, it can be ignored when deeply involved in activities.   Moderately severe pain 4 Impossible to ignore for more than a few minutes. With effort, patients may still be able to manage work or participate in some social activities. Very difficult to concentrate. Signs of autonomic nervous system discharge are evident: dilated pupils (mydriasis); mild sweating (diaphoresis); sleep interference. Heart rate becomes elevated (>115 bpm). Diastolic blood pressure (lower number) rises above 100 mmHg. Patients find relief in laying down and not moving.   Severe pain 5 Intense and extremely unpleasant. Associated with frowning face and frequent crying. Pain overwhelms the senses.  Ability to do any activity or maintain social relationships becomes significantly limited. Conversation becomes difficult. Pacing back and forth is common, as getting into a comfortable position is nearly impossible. Pain wakes you up from deep sleep. Physical signs will be obvious: pupillary dilation; increased sweating; goosebumps; brisk reflexes; cold, clammy hands and feet; nausea, vomiting or dry heaves; loss of appetite; significant sleep disturbance with inability to fall asleep or to remain asleep. When persistent, significant weight loss is observed due to the complete loss of appetite and sleep deprivation.  Blood   pressure and heart rate becomes significantly elevated. Caution: If elevated blood pressure triggers a pounding headache  associated with blurred vision, then the patient should immediately seek attention at an urgent or emergency care unit, as these may be signs of an impending stroke.    Emergency Department Pain Levels (6-10/10)  Emergency Room Pain 6 Severely limiting. Requires emergency care and should not be seen or managed at an outpatient pain management facility. Communication becomes difficult and requires great effort. Assistance to reach the emergency department may be required. Facial flushing and profuse sweating along with potentially dangerous increases in heart rate and blood pressure will be evident.   Distressing pain 7 Self-care is very difficult. Assistance is required to transport, or use restroom. Assistance to reach the emergency department will be required. Tasks requiring coordination, such as bathing and getting dressed become very difficult.   Disabling pain 8 Self-care is no longer possible. At this level, pain is disabling. The individual is unable to do even the most "basic" activities such as walking, eating, bathing, dressing, transferring to a bed, or toileting. Fine motor skills are lost. It is difficult to think clearly.   Incapacitating pain 9 Pain becomes incapacitating. Thought processing is no longer possible. Difficult to remember your own name. Control of movement and coordination are lost.   The worst pain imaginable 10 At this level, most patients pass out from pain. When this level is reached, collapse of the autonomic nervous system occurs, leading to a sudden drop in blood pressure and heart rate. This in turn results in a temporary and dramatic drop in blood flow to the brain, leading to a loss of consciousness. Fainting is one of the body's self defense mechanisms. Passing out puts the brain in a calmed state and causes it to shut down for a while, in order to begin the healing process.    Summary: 1. Refer to this scale when providing [place] with your pain level. 2. Be  accurate and careful when reporting your pain level. This will help with your care. 3. Over-reporting your pain level will lead to loss of credibility. 4. Even a level of 1/10 means that there is pain and will be treated at our facility. 5. High, inaccurate reporting will be documented as "Symptom Exaggeration", leading to loss of credibility and suspicions of possible secondary gains such as obtaining more narcotics, or wanting to appear disabled, for fraudulent reasons. 6. Only pain levels of 5 or below will be seen at our facility. 7. Pain levels of 6 and above will be sent to the Emergency Department and the appointment cancelled. ____________________________________________________________________________________________  ____________________________________________________________________________________________  Preparing for your procedure (without sedation)  Instructions: . Oral Intake: Do not eat or drink anything for at least 3 hours prior to your procedure. . Transportation: Unless otherwise stated by your physician, you may drive yourself after the procedure. . Blood Pressure Medicine: Take your blood pressure medicine with a sip of water the morning of the procedure. . Blood thinners:  . Diabetics on insulin: Notify the staff so that you can be scheduled 1st case in the morning. If your diabetes requires high dose insulin, take only  of your normal insulin dose the morning of the procedure and notify the staff that you have done so. . Preventing infections: Shower with an antibacterial soap the morning of your procedure.  . Build-up your immune system: Take 1000 mg of Vitamin C with every meal (3 times a day) the day prior to your  procedure. [facility] Antibiotics: Inform the staff if you have a condition or reason that requires you to take antibiotics before dental procedures. . Pregnancy: If you are pregnant, call and cancel the procedure. . Sickness: If you have a cold, fever, or any  active infections, call and cancel the procedure. . Arrival: You must be in the facility at least 30 minutes prior to your scheduled procedure. . Children: Do not bring any children with you. . Dress appropriately: Bring dark clothing that you would not mind if they get stained. . Valuables: Do not bring any jewelry or valuables.  Procedure appointments are reserved for interventional treatments only. [place] Kitchen No Prescription Refills. . No medication changes will be discussed during procedure appointments. . No disability issues will be discussed.  Remember:  Regular Business hours are:  Monday to Thursday 8:00 AM to 4:00 PM  Provider's Schedule: Milinda Pointer, MD:  Procedure days: Tuesday and Thursday 7:30 AM to 4:00 PM  Gillis Santa, MD:  Procedure days: Monday and Wednesday 7:30 AM to 4:00 PM ____________________________________________________________________________________________  ____________________________________________________________________________________________  Initial Gabapentin Titration  Medication used: Gabapentin (Generic Name) or Neurontin (Brand Name) 100 mg tablets/capsules  Reasons to stop increasing the dose:  Reason 1: You get good relief of symptoms, in which case there is no need to increase the daily dose any further.    Reason 2: You develop some side effects, such as sleeping all of the time, difficulty concentrating, or becoming disoriented, in which case you need to go down on the dose, to the prior level, where you were not experiencing any side effects. Stay on that dose longer, to allow more time for your body to get use it, before attempting to increase it again.   Steps: Step 1: Start by taking 1 (one) tablet at bedtime x 7 (seven) days.  Step 2: After being on 1 (one) tablet for 7 (seven) days, then increase it to 2 (two) tablets at bedtime for another 7 (seven) days.  Step 3: Next, after being on 2 (two) tablets at bedtime for 7 (seven)  days, then increase it to 3 (three) tablets at bedtime, and stay on that dose until you see your doctor.  Reasons to stop increasing the dose: Reason 1: You get good relief of symptoms, in which case there is no need to increase the daily dose any further.  Reason 2: You develop some side effects, such as sleeping all of the time, difficulty concentrating, or becoming disoriented, in which case you need to go down on the dose, to the prior level, where you were not experiencing any side effects. Stay on that dose longer, to allow more time for your body to get use it, before attempting to increase it again.  Endpoint: Once you have reached the maximum dose you can tolerate without side-effects, contact your physician so as to evaluate the results of the regimen.   Questions: Feel free to contact us for any questions or problems at [phone] ____________________________________________________________________________________________ Trigger Point Injections Patient Information  Description: Trigger points are areas of muscle sensitive to touch which cause pain with movement, sometimes felt some distance from the site of palpation.  Usually the muscle containing these trigger points if felt as a tight band or knot.   The area of maximum tenderness or trigger point is identified, and after antiseptic preparation of the skin, a small needle is placed into this site.  Reproduction of the pain often occurs and numbing medicine (local anesthetic) is injected  into the site, sometimes along with steroid preparation.  The entire block usually lasts less than 5 minutes.  Conditions which may be treated by trigger points:   Muscular pain and spasm  Nerve irritation  Preparation for the injection:  1. Do not eat any solid food or dairy products within 8 hours of your appointment. 2. You may drink clear liquids up to 3 hours before appointment.  Clear liquids include water, black coffee, juice or  soda.  No milk or cream please. 3. You may take your regular medications, including pain medications, with a sip of water before your appointment.  Diabetics should hold regular insulin ( if take separately) and take 1/2 normal NPH dose the morning of the procedure.  Carry some sugar containing items with you to your appointment. 4. A driver must accompany you and be prepared to drive you home after your procedure.  5. Bring all your current medications with you. 6. An IV may be inserted and sedation may be given at the discretion of the physician.  7. A blood pressure cuff, EKG, and other monitors will often be applied during the procedure.  Some patients may need to have extra oxygen administered for a short period. 8. You will be asked to provide medical information, including your allergies and medications, prior to the procedure.  We must know immediately if you are taking blood thinners (like Coumadin/Warfarin) or if you are allergic to IV iodine contrast (dye).  We must know if you could possibly be pregnant.  Possible side-effects:   Bleeding from needle site  Infection (rare, may require surgery)  Nerve injury (rare)  Numbness & tingling (temporary)  Punctured lung (if injection around chest)  Light-headedness (temporary)  Pain at injection site (several days)  Decreased blood pressure (rare, temporary)  Weakness in arm/leg (temporary)  Call if you experience:   Hive or difficulty breathing (go to the emergency room)  Inflammation or drainage at the injection site(s)  Please note:  Although the local anesthetic injected can often make your painful muscle feel good for several hours after the injection, the pain may return.  It takes 3-7 days for steroids to work.  You may not notice any pain relief for at least one week.  If effective, we will often do a series of injections spaced 3-6 weeks apart to maximally decrease your pain.  If you have any questions please  call [phone] [facility]

## 2017-08-26 ENCOUNTER — Ambulatory Visit: Payer: Medicare Other | Admitting: Pain Medicine

## 2017-08-26 NOTE — Progress Notes (Deleted)
Patient's Name: Suzanne Moses  MRN: [id number]  Referring Provider: Herminio Commons, MD  DOB: 08-25-1985  PCP: Herminio Commons, MD  DOS: 08/26/2017  Note by: Gaspar Cola, MD  Service setting: Ambulatory outpatient  Specialty: Interventional Pain Management  Patient type: Established  Location: [facility] (AMB) Pain Management Facility  Visit type: Interventional Procedure   Primary Reason for Visit: Interventional Pain Management Treatment. CC: No chief complaint on file.  Procedure:  Anesthesia, Analgesia, Anxiolysis:  Type: Therapeutic Intra-Articular Hyalgan Knee Injection #1  Region: Lateral infrapatellar Knee Region Level: Knee Joint Laterality: Left knee  Type: Local Anesthesia Indication(s): Analgesia         Local Anesthetic: Lidocaine 1-2% Route: Infiltration ([place]/IM) IV Access: Declined Sedation: Declined    Indications: 1. Osteoarthritis of knee (Left)   2. Chronic knee pain (Primary Area of Pain) (Left)   3. Patellar tendinitis of knee (Left)    Pain Score: Pre-procedure:  /10 Post-procedure:  /10  Pre-op Assessment:  Suzanne Moses is a 32 y.o. (year old), female patient, seen today for interventional treatment. She  has a past surgical history that includes Tonsillectomy; Ovary surgery; and Unilateral salpingectomy (Left, 2013). Suzanne Moses has a current medication list which includes the following prescription(s): alprazolam, amphetamine-dextroamphetamine, gnp calcium 1200, vitamin d3, ergocalciferol, etonogestrel, gabapentin, ibuprofen, magnesium, and ziprasidone. Her primarily concern today is the No chief complaint on file.  Initial Vital Signs:  Pulse/HCG Rate:    Temp:   Resp:   BP:   SpO2:    BMI: Estimated body mass index is 47.19 kg/m as calculated from the following:   Height as of 08/20/17: 5\' 2"  (1.575 m).   Weight as of 08/20/17: 258 lb (117 kg).  Risk Assessment: Allergies: Reviewed. She is allergic to zofran [ondansetron hcl].  Allergy  Precautions: None required Coagulopathies: Reviewed. None identified.  Blood-thinner therapy: None at this time Active Infection(s): Reviewed. None identified. Suzanne Moses is afebrile  Site Confirmation: Suzanne Moses was asked to confirm the procedure and laterality before marking the site Procedure checklist: Completed Consent: Before the procedure and under the influence of no sedative(s), amnesic(s), or anxiolytics, the patient was informed of the treatment options, risks and possible complications. To fulfill our ethical and legal obligations, as recommended by the American Medical Association's Code of Ethics, I have informed the patient of my clinical impression; the nature and purpose of the treatment or procedure; the risks, benefits, and possible complications of the intervention; the alternatives, including doing nothing; the risk(s) and benefit(s) of the alternative treatment(s) or procedure(s); and the risk(s) and benefit(s) of doing nothing. The patient was provided information about the general risks and possible complications associated with the procedure. These may include, but are not limited to: failure to achieve desired goals, infection, bleeding, organ or nerve damage, allergic reactions, paralysis, and death. In addition, the patient was informed of those risks and complications associated to the procedure, such as failure to decrease pain; infection; bleeding; organ or nerve damage with subsequent damage to sensory, motor, and/or autonomic systems, resulting in permanent pain, numbness, and/or weakness of one or several areas of the body; allergic reactions; (i.e.: anaphylactic reaction); and/or death. Furthermore, the patient was informed of those risks and complications associated with the medications. These include, but are not limited to: allergic reactions (i.e.: anaphylactic or anaphylactoid reaction(s)); adrenal axis suppression; blood sugar elevation that in diabetics may result in  ketoacidosis or comma; water retention that in patients with history of congestive heart failure  may result in shortness of breath, pulmonary edema, and decompensation with resultant heart failure; weight gain; swelling or edema; medication-induced neural toxicity; particulate matter embolism and blood vessel occlusion with resultant organ, and/or nervous system infarction; and/or aseptic necrosis of one or more joints. Finally, the patient was informed that Medicine is not an exact science; therefore, there is also the possibility of unforeseen or unpredictable risks and/or possible complications that may result in a catastrophic outcome. The patient indicated having understood very clearly. We have given the patient no guarantees and we have made no promises. Enough time was given to the patient to ask questions, all of which were answered to the patient's satisfaction. Suzanne Moses has indicated that she wanted to continue with the procedure. Attestation: I, the ordering provider, attest that I have discussed with the patient the benefits, risks, side-effects, alternatives, likelihood of achieving goals, and potential problems during recovery for the procedure that I have provided informed consent. Date  Time: {CHL [facility]-PAIN TIME CHOICES:21018001}  Pre-Procedure Preparation:  Monitoring: As per clinic protocol. Respiration, ETCO2, SpO2, BP, heart rate and rhythm monitor placed and checked for adequate function Safety Precautions: Patient was assessed for positional comfort and pressure points before starting the procedure. Time-out: I initiated and conducted the "Time-out" before starting the procedure, as per protocol. The patient was asked to participate by confirming the accuracy of the "Time Out" information. Verification of the correct person, site, and procedure were performed and confirmed by me, the nursing staff, and the patient. "Time-out" conducted as per Joint Commission's Universal Protocol  (UP.01.01.01). Time:    Description of Procedure:       Position: Sitting Target Area: Knee Joint Approach: Just above the Lateral tibial plateau, lateral to the infrapatellar tendon. Area Prepped: Entire knee area, from the mid-thigh to the mid-shin. Prepping solution: ChloraPrep (2% chlorhexidine gluconate and 70% isopropyl alcohol) Safety Precautions: Aspiration looking for blood return was conducted prior to all injections. At no point did we inject any substances, as a needle was being advanced. No attempts were made at seeking any paresthesias. Safe injection practices and needle disposal techniques used. Medications properly checked for expiration dates. SDV (single dose vial) medications used. Description of the Procedure: Protocol guidelines were followed. The patient was placed in position over the fluoroscopy table. The target area was identified and the area prepped in the usual manner. Skin desensitized using vapocoolant spray. Skin & deeper tissues infiltrated with local anesthetic. Appropriate amount of time allowed to pass for local anesthetics to take effect. The procedure needles were then advanced to the target area. Proper needle placement secured. Negative aspiration confirmed. Solution injected in intermittent fashion, asking for systemic symptoms every 0.5cc of injectate. The needles were then removed and the area cleansed, making sure to leave some of the prepping solution back to take advantage of its long term bactericidal properties. There were no vitals filed for this visit.  Start Time:   hrs. End Time:   hrs. Materials:  Needle(s) Type: Regular needle Gauge: 22G Length: 3.5-in Medication(s): Please see orders for medications and dosing details.  Imaging Guidance:  Type of Imaging Technique: None used Indication(s): N/A Exposure Time: No patient exposure Contrast: None used. Fluoroscopic Guidance: N/A Ultrasound Guidance: N/A Interpretation: N/A  Antibiotic  Prophylaxis:   Anti-infectives (From admission, onward)   None     Indication(s): None identified  Post-operative Assessment:  Post-procedure Vital Signs:  Pulse/HCG Rate:    Temp:   Resp:   BP:   SpO2:  EBL: None  Complications: No immediate post-treatment complications observed by team, or reported by patient.  Note: The patient tolerated the entire procedure well. A repeat set of vitals were taken after the procedure and the patient was kept under observation following institutional policy, for this type of procedure. Post-procedural neurological assessment was performed, showing return to baseline, prior to discharge. The patient was provided with post-procedure discharge instructions, including a section on how to identify potential problems. Should any problems arise concerning this procedure, the patient was given instructions to immediately contact us, at any time, without hesitation. In any case, we plan to contact the patient by telephone for a follow-up status report regarding this interventional procedure.  Comments:  No additional relevant information.  Plan of Care   Imaging Orders  No imaging studies ordered today   Procedure Orders    No procedure(s) ordered today    Medications ordered for procedure: No orders of the defined types were placed in this encounter.  Medications administered: Krishauna E. Everard "Benjamine Mola" had no medications administered during this visit.  See the medical record for exact dosing, route, and time of administration.  New Prescriptions   No medications on file   Disposition: Discharge home  Discharge Date & Time: 08/26/2017;   hrs.   Physician-requested Follow-up: No follow-ups on file.  Future Appointments  Date Time Provider [place]  08/26/2017 12:30 PM Milinda Pointer, MD [facility] None   Primary Care Physician: Herminio Commons, MD Location: [facility] Outpatient Pain Management Facility Note by: Gaspar Cola, MD Date: 08/26/2017; Time: 7:06 AM  Disclaimer:  Medicine is not an Chief Strategy Officer. The only guarantee in medicine is that nothing is guaranteed. It is important to note that the decision to proceed with this intervention was based on the information collected from the patient. The Data and conclusions were drawn from the patient's questionnaire, the interview, and the physical examination. Because the information was provided in large part by the patient, it cannot be guaranteed that it has not been purposely or unconsciously manipulated. Every effort has been made to obtain as much relevant data as possible for this evaluation. It is important to note that the conclusions that lead to this procedure are derived in large part from the available data. Always take into account that the treatment will also be dependent on availability of resources and existing treatment guidelines, considered by other Pain Management Practitioners as being common knowledge and practice, at the time of the intervention. For Medico-Legal purposes, it is also important to point out that variation in procedural techniques and pharmacological choices are the acceptable norm. The indications, contraindications, technique, and results of the above procedure should only be interpreted and judged by a Board-Certified Interventional Pain Specialist with extensive familiarity and expertise in the same exact procedure and technique.

## 2017-09-11 ENCOUNTER — Ambulatory Visit: Payer: Medicare Other | Admitting: Pain Medicine

## 2017-09-11 NOTE — Progress Notes (Deleted)
Patient's Name: Suzanne Moses  MRN: [id number]  Referring Provider: Herminio Commons, MD  DOB: July 30, 1985  PCP: Herminio Commons, MD  DOS: 09/11/2017  Note by: Gaspar Cola, MD  Service setting: Ambulatory outpatient  Specialty: Interventional Pain Management  Patient type: Established  Location: [facility] (AMB) Pain Management Facility  Visit type: Interventional Procedure   Primary Reason for Visit: Interventional Pain Management Treatment. CC: No chief complaint on file.  Procedure:  Anesthesia, Analgesia, Anxiolysis:  Type: Therapeutic Intra-Articular Hyalgan Knee Injection #1  Region: Lateral infrapatellar Knee Region Level: Knee Joint Laterality: Left knee  Type: Local Anesthesia Indication(s): Analgesia         Local Anesthetic: Lidocaine 1-2% Route: Infiltration ([place]/IM) IV Access: Declined Sedation: Declined    Indications: 1. Osteoarthritis of knee (Left)   2. Chronic knee pain (Primary Area of Pain) (Left)    Pain Score: Pre-procedure:  /10 Post-procedure:  /10  Pre-op Assessment:  Suzanne Moses is a 32 y.o. (year old), female patient, seen today for interventional treatment. She  has a past surgical history that includes Tonsillectomy; Ovary surgery; and Unilateral salpingectomy (Left, 2013). Suzanne Moses has a current medication list which includes the following prescription(s): alprazolam, amphetamine-dextroamphetamine, gnp calcium 1200, vitamin d3, etonogestrel, gabapentin, ibuprofen, magnesium, and ziprasidone. Her primarily concern today is the No chief complaint on file.  Initial Vital Signs:  Pulse/HCG Rate:    Temp:   Resp:   BP:   SpO2:    BMI: Estimated body mass index is 47.19 kg/m as calculated from the following:   Height as of 08/20/17: 5\' 2"  (1.575 m).   Weight as of 08/20/17: 258 lb (117 kg).  Risk Assessment: Allergies: Reviewed. She is allergic to zofran [ondansetron hcl].  Allergy Precautions: None required Coagulopathies: Reviewed. None  identified.  Blood-thinner therapy: None at this time Active Infection(s): Reviewed. None identified. Suzanne Moses is afebrile  Site Confirmation: Suzanne Moses was asked to confirm the procedure and laterality before marking the site Procedure checklist: Completed Consent: Before the procedure and under the influence of no sedative(s), amnesic(s), or anxiolytics, the patient was informed of the treatment options, risks and possible complications. To fulfill our ethical and legal obligations, as recommended by the American Medical Association's Code of Ethics, I have informed the patient of my clinical impression; the nature and purpose of the treatment or procedure; the risks, benefits, and possible complications of the intervention; the alternatives, including doing nothing; the risk(s) and benefit(s) of the alternative treatment(s) or procedure(s); and the risk(s) and benefit(s) of doing nothing. The patient was provided information about the general risks and possible complications associated with the procedure. These may include, but are not limited to: failure to achieve desired goals, infection, bleeding, organ or nerve damage, allergic reactions, paralysis, and death. In addition, the patient was informed of those risks and complications associated to the procedure, such as failure to decrease pain; infection; bleeding; organ or nerve damage with subsequent damage to sensory, motor, and/or autonomic systems, resulting in permanent pain, numbness, and/or weakness of one or several areas of the body; allergic reactions; (i.e.: anaphylactic reaction); and/or death. Furthermore, the patient was informed of those risks and complications associated with the medications. These include, but are not limited to: allergic reactions (i.e.: anaphylactic or anaphylactoid reaction(s)); adrenal axis suppression; blood sugar elevation that in diabetics may result in ketoacidosis or comma; water retention that in patients  with history of congestive heart failure may result in shortness of breath, pulmonary edema, and  decompensation with resultant heart failure; weight gain; swelling or edema; medication-induced neural toxicity; particulate matter embolism and blood vessel occlusion with resultant organ, and/or nervous system infarction; and/or aseptic necrosis of one or more joints. Finally, the patient was informed that Medicine is not an exact science; therefore, there is also the possibility of unforeseen or unpredictable risks and/or possible complications that may result in a catastrophic outcome. The patient indicated having understood very clearly. We have given the patient no guarantees and we have made no promises. Enough time was given to the patient to ask questions, all of which were answered to the patient's satisfaction. Suzanne Moses has indicated that she wanted to continue with the procedure. Attestation: I, the ordering provider, attest that I have discussed with the patient the benefits, risks, side-effects, alternatives, likelihood of achieving goals, and potential problems during recovery for the procedure that I have provided informed consent. Date  Time: {CHL [facility]-PAIN TIME CHOICES:21018001}  Pre-Procedure Preparation:  Monitoring: As per clinic protocol. Respiration, ETCO2, SpO2, BP, heart rate and rhythm monitor placed and checked for adequate function Safety Precautions: Patient was assessed for positional comfort and pressure points before starting the procedure. Time-out: I initiated and conducted the "Time-out" before starting the procedure, as per protocol. The patient was asked to participate by confirming the accuracy of the "Time Out" information. Verification of the correct person, site, and procedure were performed and confirmed by me, the nursing staff, and the patient. "Time-out" conducted as per Joint Commission's Universal Protocol (UP.01.01.01). Time:    Description of Procedure:        Position: Sitting Target Area: Knee Joint Approach: Just above the Lateral tibial plateau, lateral to the infrapatellar tendon. Area Prepped: Entire knee area, from the mid-thigh to the mid-shin. Prepping solution: ChloraPrep (2% chlorhexidine gluconate and 70% isopropyl alcohol) Safety Precautions: Aspiration looking for blood return was conducted prior to all injections. At no point did we inject any substances, as a needle was being advanced. No attempts were made at seeking any paresthesias. Safe injection practices and needle disposal techniques used. Medications properly checked for expiration dates. SDV (single dose vial) medications used. Description of the Procedure: Protocol guidelines were followed. The patient was placed in position over the fluoroscopy table. The target area was identified and the area prepped in the usual manner. Skin desensitized using vapocoolant spray. Skin & deeper tissues infiltrated with local anesthetic. Appropriate amount of time allowed to pass for local anesthetics to take effect. The procedure needles were then advanced to the target area. Proper needle placement secured. Negative aspiration confirmed. Solution injected in intermittent fashion, asking for systemic symptoms every 0.5cc of injectate. The needles were then removed and the area cleansed, making sure to leave some of the prepping solution back to take advantage of its long term bactericidal properties. There were no vitals filed for this visit.  Start Time:   hrs. End Time:   hrs. Materials:  Needle(s) Type: Regular needle Gauge: 22G Length: 3.5-in Medication(s): Please see orders for medications and dosing details.  Imaging Guidance:  Type of Imaging Technique: None used Indication(s): N/A Exposure Time: No patient exposure Contrast: None used. Fluoroscopic Guidance: N/A Ultrasound Guidance: N/A Interpretation: N/A  Antibiotic Prophylaxis:   Anti-infectives (From admission, onward)    None     Indication(s): None identified  Post-operative Assessment:  Post-procedure Vital Signs:  Pulse/HCG Rate:    Temp:   Resp:   BP:   SpO2:    EBL: None  Complications: No immediate  post-treatment complications observed by team, or reported by patient.  Note: The patient tolerated the entire procedure well. A repeat set of vitals were taken after the procedure and the patient was kept under observation following institutional policy, for this type of procedure. Post-procedural neurological assessment was performed, showing return to baseline, prior to discharge. The patient was provided with post-procedure discharge instructions, including a section on how to identify potential problems. Should any problems arise concerning this procedure, the patient was given instructions to immediately contact us, at any time, without hesitation. In any case, we plan to contact the patient by telephone for a follow-up status report regarding this interventional procedure.  Comments:  No additional relevant information.  Plan of Care   Imaging Orders  No imaging studies ordered today   Procedure Orders    No procedure(s) ordered today    Medications ordered for procedure: No orders of the defined types were placed in this encounter.  Medications administered: Avree E. Korus "Benjamine Mola" had no medications administered during this visit.  See the medical record for exact dosing, route, and time of administration.  New Prescriptions   No medications on file   Disposition: Discharge home  Discharge Date & Time: 09/11/2017;   hrs.   Physician-requested Follow-up: No follow-ups on file.  Future Appointments  Date Time Provider [place]  09/11/2017 12:45 PM Milinda Pointer, MD [facility] None   Primary Care Physician: Herminio Commons, MD Location: [facility] Outpatient Pain Management Facility Note by: Gaspar Cola, MD Date: 09/11/2017; Time: 5:56 AM  Disclaimer:   Medicine is not an exact science. The only guarantee in medicine is that nothing is guaranteed. It is important to note that the decision to proceed with this intervention was based on the information collected from the patient. The Data and conclusions were drawn from the patient's questionnaire, the interview, and the physical examination. Because the information was provided in large part by the patient, it cannot be guaranteed that it has not been purposely or unconsciously manipulated. Every effort has been made to obtain as much relevant data as possible for this evaluation. It is important to note that the conclusions that lead to this procedure are derived in large part from the available data. Always take into account that the treatment will also be dependent on availability of resources and existing treatment guidelines, considered by other Pain Management Practitioners as being common knowledge and practice, at the time of the intervention. For Medico-Legal purposes, it is also important to point out that variation in procedural techniques and pharmacological choices are the acceptable norm. The indications, contraindications, technique, and results of the above procedure should only be interpreted and judged by a Board-Certified Interventional Pain Specialist with extensive familiarity and expertise in the same exact procedure and technique.

## 2017-09-18 ENCOUNTER — Encounter: Payer: Self-pay | Admitting: Emergency Medicine

## 2017-09-18 ENCOUNTER — Emergency Department
Admission: EM | Admit: 2017-09-18 | Discharge: 2017-09-18 | Disposition: A | Discharge status: Home / Self Care | Payer: Medicare Other | Attending: Emergency Medicine | Admitting: Emergency Medicine

## 2017-09-18 ENCOUNTER — Emergency Department: Payer: Medicare Other

## 2017-09-18 ENCOUNTER — Other Ambulatory Visit: Payer: Self-pay

## 2017-09-18 DIAGNOSIS — Z96641 Presence of right artificial hip joint: Secondary | ICD-10-CM | POA: Diagnosis not present

## 2017-09-18 DIAGNOSIS — R1031 Right lower quadrant pain: Secondary | ICD-10-CM | POA: Insufficient documentation

## 2017-09-18 DIAGNOSIS — K429 Umbilical hernia without obstruction or gangrene: Secondary | ICD-10-CM | POA: Diagnosis not present

## 2017-09-18 DIAGNOSIS — F17210 Nicotine dependence, cigarettes, uncomplicated: Secondary | ICD-10-CM | POA: Insufficient documentation

## 2017-09-18 DIAGNOSIS — Z79899 Other long term (current) drug therapy: Secondary | ICD-10-CM | POA: Insufficient documentation

## 2017-09-18 DIAGNOSIS — K439 Ventral hernia without obstruction or gangrene: Secondary | ICD-10-CM | POA: Diagnosis not present

## 2017-09-18 LAB — URINALYSIS, COMPLETE (UACMP) WITH MICROSCOPIC
Bilirubin Urine: NEGATIVE
Glucose, UA: NEGATIVE mg/dL
Hgb urine dipstick: NEGATIVE
Ketones, ur: 5 mg/dL — AB
Leukocytes, UA: NEGATIVE
Nitrite: NEGATIVE
Protein, ur: NEGATIVE mg/dL
Specific Gravity, Urine: 1.026 (ref 1.005–1.030)
pH: 5 (ref 5.0–8.0)

## 2017-09-18 LAB — COMPREHENSIVE METABOLIC PANEL
ALT: 18 U/L (ref 14–54)
AST: 22 U/L (ref 15–41)
Albumin: 4.2 g/dL (ref 3.5–5.0)
Alkaline Phosphatase: 68 U/L (ref 38–126)
Anion gap: 11 (ref 5–15)
BUN: 13 mg/dL (ref 6–20)
CO2: 25 mmol/L (ref 22–32)
Calcium: 9.2 mg/dL (ref 8.9–10.3)
Chloride: 101 mmol/L (ref 101–111)
Creatinine, Ser: 0.72 mg/dL (ref 0.44–1.00)
GFR calc Af Amer: >60 mL/min (ref >60)
GFR calc non Af Amer: >60 mL/min (ref >60)
Glucose, Bld: 87 mg/dL (ref 65–99)
Potassium: 3.6 mmol/L (ref 3.5–5.1)
Sodium: 137 mmol/L (ref 135–145)
Total Bilirubin: 0.4 mg/dL (ref 0.3–1.2)
Total Protein: 7.9 g/dL (ref 6.5–8.1)

## 2017-09-18 LAB — CBC
HCT: 40.5 % (ref 35.0–47.0)
Hemoglobin: 13.5 g/dL (ref 12.0–16.0)
MCH: 27.8 pg (ref 26.0–34.0)
MCHC: 33.4 g/dL (ref 32.0–36.0)
MCV: 83.2 fL (ref 80.0–100.0)
Platelets: 261 10*3/uL (ref 150–440)
RBC: 4.87 MIL/uL (ref 3.80–5.20)
RDW: 15.4 % — ABNORMAL HIGH (ref 11.5–14.5)
WBC: 11.8 10*3/uL — ABNORMAL HIGH (ref 3.6–11.0)

## 2017-09-18 LAB — WET PREP, GENITAL
Clue Cells Wet Prep HPF POC: NONE SEEN
Sperm: NONE SEEN
Trich, Wet Prep: NONE SEEN
Yeast Wet Prep HPF POC: NONE SEEN

## 2017-09-18 LAB — CHLAMYDIA/NGC RT PCR (ARMC ONLY)
Chlamydia Tr: NOT DETECTED
N gonorrhoeae: NOT DETECTED

## 2017-09-18 LAB — POC URINE PREG, ED: Preg Test, Ur: NEGATIVE

## 2017-09-18 LAB — LIPASE, BLOOD: Lipase: 24 U/L (ref 11–51)

## 2017-09-18 MED ORDER — KETOROLAC TROMETHAMINE 30 MG/ML IJ SOLN
15.0000 mg | Freq: Once | INTRAMUSCULAR | Status: AC
  Administered 2017-09-18: 15 mg via INTRAVENOUS

## 2017-09-18 MED ORDER — IOHEXOL 300 MG/ML  SOLN
100.0000 mL | Freq: Once | INTRAMUSCULAR | Status: AC | PRN
  Administered 2017-09-18: 100 mL via INTRAVENOUS
  Filled 2017-09-18: qty 100

## 2017-09-18 MED ORDER — KETOROLAC TROMETHAMINE 30 MG/ML IJ SOLN
INTRAMUSCULAR | Status: AC
  Administered 2017-09-18: 15 mg via INTRAVENOUS
  Filled 2017-09-18: qty 1

## 2017-09-18 NOTE — ED Triage Notes (Signed)
Pt to ED via POV with c/o abd pain xfew months, states she was dx with bilateral ovarian cysts x41months ago but pain has increased. Pt ambulatory, denies fever. VSS
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
## 2017-09-18 NOTE — Discharge Instructions (Addendum)

## 2017-09-24 DIAGNOSIS — R197 Diarrhea, unspecified: Secondary | ICD-10-CM | POA: Diagnosis not present

## 2017-09-24 DIAGNOSIS — R1031 Right lower quadrant pain: Secondary | ICD-10-CM | POA: Diagnosis not present

## 2017-09-25 ENCOUNTER — Other Ambulatory Visit
Admission: RE | Admit: 2017-09-25 | Discharge: 2017-09-25 | Disposition: A | Discharge status: Home / Self Care | Payer: Medicare Other | Source: Ambulatory Visit | Attending: Gastroenterology | Admitting: Gastroenterology

## 2017-09-25 DIAGNOSIS — R1031 Right lower quadrant pain: Secondary | ICD-10-CM | POA: Diagnosis not present

## 2017-09-25 LAB — GASTROINTESTINAL PANEL BY PCR, STOOL (REPLACES STOOL CULTURE)

## 2017-09-25 LAB — C DIFFICILE QUICK SCREEN W PCR REFLEX
C Diff antigen: NEGATIVE
C Diff interpretation: NOT DETECTED
C Diff toxin: NEGATIVE

## 2017-10-03 DIAGNOSIS — Z79899 Other long term (current) drug therapy: Secondary | ICD-10-CM | POA: Diagnosis not present

## 2017-10-03 DIAGNOSIS — F410 Panic disorder [episodic paroxysmal anxiety] without agoraphobia: Secondary | ICD-10-CM | POA: Diagnosis not present

## 2017-10-10 ENCOUNTER — Telehealth: Payer: Self-pay

## 2017-10-10 NOTE — Telephone Encounter (Signed)
Pt requesting to be reestablish with previous PCP Dr. Caryn Section. Has not been seen in 2015 since pregnancy. She was scheduled for a reestablish in 2018, but no showed. Please advise.

## 2017-10-10 NOTE — Telephone Encounter (Signed)
Please advise 

## 2017-10-14 ENCOUNTER — Other Ambulatory Visit: Payer: Self-pay | Admitting: Pain Medicine

## 2017-10-14 DIAGNOSIS — M792 Neuralgia and neuritis, unspecified: Secondary | ICD-10-CM

## 2017-10-23 ENCOUNTER — Ambulatory Visit: Payer: Medicare Other | Attending: Pain Medicine | Admitting: Pain Medicine

## 2017-10-23 NOTE — Progress Notes (Deleted)
Patient's Name: Suzanne Moses  MRN: [id number]  Referring Provider: Herminio Commons, MD  DOB: 04-Dec-1985  PCP: Herminio Commons, MD  DOS: 10/23/2017  Note by: Gaspar Cola, MD  Service setting: Ambulatory outpatient  Specialty: Interventional Pain Management  Patient type: Established  Location: [facility] (AMB) Pain Management Facility  Visit type: Interventional Procedure   Primary Reason for Visit: Interventional Pain Management Treatment. CC: No chief complaint on file.  Procedure:          Anesthesia, Analgesia, Anxiolysis:  Type: Therapeutic Intra-Articular Hyalgan Knee Injection #1  Region: Lateral infrapatellar Knee Region Level: Knee Joint Laterality: Left knee  Type: Local Anesthesia Indication(s): Analgesia         Local Anesthetic: Lidocaine 1-2% Route: Infiltration ([place]/IM) IV Access: Declined Sedation: Declined    Indications: 1. Osteoarthritis of knee (Left)   2. Chronic knee pain (Primary Area of Pain) (Left)    Pain Score: Pre-procedure:  /10 Post-procedure:  /10  Pre-op Assessment:  Ms. Lung is a 32 y.o. (year old), female patient, seen today for interventional treatment. She  has a past surgical history that includes Tonsillectomy; Ovary surgery; and Unilateral salpingectomy (Left, 2013). [name] has a current medication list which includes the following prescription(s): alprazolam, amphetamine-dextroamphetamine, gnp calcium 1200, vitamin d3, etonogestrel, gabapentin, ibuprofen, magnesium, and ziprasidone. Her primarily concern today is the No chief complaint on file.  Initial Vital Signs:  Pulse/HCG Rate:    Temp:   Resp:   BP:   SpO2:    BMI: Estimated body mass index is 47.83 kg/m as calculated from the following:   Height as of 09/18/17: 5\' 3"  (1.6 m).   Weight as of 09/18/17: 270 lb (122.5 kg).  Risk Assessment: Allergies: Reviewed. She is allergic to zofran [ondansetron hcl].  Allergy Precautions: None required Coagulopathies: Reviewed.  None identified.  Blood-thinner therapy: None at this time Active Infection(s): Reviewed. None identified. [name] is afebrile  Site Confirmation: [name] was asked to confirm the procedure and laterality before marking the site Procedure checklist: Completed Consent: Before the procedure and under the influence of no sedative(s), amnesic(s), or anxiolytics, the patient was informed of the treatment options, risks and possible complications. To fulfill our ethical and legal obligations, as recommended by the American Medical Association's Code of Ethics, I have informed the patient of my clinical impression; the nature and purpose of the treatment or procedure; the risks, benefits, and possible complications of the intervention; the alternatives, including doing nothing; the risk(s) and benefit(s) of the alternative treatment(s) or procedure(s); and the risk(s) and benefit(s) of doing nothing. The patient was provided information about the general risks and possible complications associated with the procedure. These may include, but are not limited to: failure to achieve desired goals, infection, bleeding, organ or nerve damage, allergic reactions, paralysis, and death. In addition, the patient was informed of those risks and complications associated to the procedure, such as failure to decrease pain; infection; bleeding; organ or nerve damage with subsequent damage to sensory, motor, and/or autonomic systems, resulting in permanent pain, numbness, and/or weakness of one or several areas of the body; allergic reactions; (i.e.: anaphylactic reaction); and/or death. Furthermore, the patient was informed of those risks and complications associated with the medications. These include, but are not limited to: allergic reactions (i.e.: anaphylactic or anaphylactoid reaction(s)); adrenal axis suppression; blood sugar elevation that in diabetics may result in ketoacidosis or comma; water retention that in  patients with history of congestive heart failure may  result in shortness of breath, pulmonary edema, and decompensation with resultant heart failure; weight gain; swelling or edema; medication-induced neural toxicity; particulate matter embolism and blood vessel occlusion with resultant organ, and/or nervous system infarction; and/or aseptic necrosis of one or more joints. Finally, the patient was informed that Medicine is not an exact science; therefore, there is also the possibility of unforeseen or unpredictable risks and/or possible complications that may result in a catastrophic outcome. The patient indicated having understood very clearly. We have given the patient no guarantees and we have made no promises. Enough time was given to the patient to ask questions, all of which were answered to the patient's satisfaction. [name] has indicated that she wanted to continue with the procedure. Attestation: I, the ordering provider, attest that I have discussed with the patient the benefits, risks, side-effects, alternatives, likelihood of achieving goals, and potential problems during recovery for the procedure that I have provided informed consent. Date  Time: {CHL [facility]-PAIN TIME CHOICES:[id number]}  Pre-Procedure Preparation:  Monitoring: As per clinic protocol. Respiration, ETCO2, SpO2, BP, heart rate and rhythm monitor placed and checked for adequate function Safety Precautions: Patient was assessed for positional comfort and pressure points before starting the procedure. Time-out: I initiated and conducted the "Time-out" before starting the procedure, as per protocol. The patient was asked to participate by confirming the accuracy of the "Time Out" information. Verification of the correct person, site, and procedure were performed and confirmed by me, the nursing staff, and the patient. "Time-out" conducted as per Joint Commission's Universal Protocol (UP.01.01.01). Time:    Description of  Procedure:          Position: Sitting Target Area: Knee Joint Approach: Just above the Lateral tibial plateau, lateral to the infrapatellar tendon. Area Prepped: Entire knee area, from the mid-thigh to the mid-shin. Prepping solution: ChloraPrep (2% chlorhexidine gluconate and 70% isopropyl alcohol) Safety Precautions: Aspiration looking for blood return was conducted prior to all injections. At no point did we inject any substances, as a needle was being advanced. No attempts were made at seeking any paresthesias. Safe injection practices and needle disposal techniques used. Medications properly checked for expiration dates. SDV (single dose vial) medications used. Description of the Procedure: Protocol guidelines were followed. The patient was placed in position over the fluoroscopy table. The target area was identified and the area prepped in the usual manner. Skin & deeper tissues infiltrated with local anesthetic. Appropriate amount of time allowed to pass for local anesthetics to take effect. The procedure needles were then advanced to the target area. Proper needle placement secured. Negative aspiration confirmed. Solution injected in intermittent fashion, asking for systemic symptoms every 0.5cc of injectate. The needles were then removed and the area cleansed, making sure to leave some of the prepping solution back to take advantage of its long term bactericidal properties. There were no vitals filed for this visit.  Start Time:   hrs. End Time:   hrs. Materials:  Needle(s) Type: Regular needle Gauge: 22G Length: 3.5-in Medication(s): Please see orders for medications and dosing details.  Imaging Guidance:          Type of Imaging Technique: None used Indication(s): N/A Exposure Time: No patient exposure Contrast: None used. Fluoroscopic Guidance: N/A Ultrasound Guidance: N/A Interpretation: N/A  Antibiotic Prophylaxis:   Anti-infectives (From admission, onward)   None      Indication(s): None identified  Post-operative Assessment:  Post-procedure Vital Signs:  Pulse/HCG Rate:    Temp:   Resp:  BP:   SpO2:    EBL: None  Complications: No immediate post-treatment complications observed by team, or reported by patient.  Note: The patient tolerated the entire procedure well. A repeat set of vitals were taken after the procedure and the patient was kept under observation following institutional policy, for this type of procedure. Post-procedural neurological assessment was performed, showing return to baseline, prior to discharge. The patient was provided with post-procedure discharge instructions, including a section on how to identify potential problems. Should any problems arise concerning this procedure, the patient was given instructions to immediately contact us, at any time, without hesitation. In any case, we plan to contact the patient by telephone for a follow-up status report regarding this interventional procedure.  Comments:  No additional relevant information.  Plan of Care   Imaging Orders  No imaging studies ordered today   Procedure Orders    No procedure(s) ordered today    Medications ordered for procedure: No orders of the defined types were placed in this encounter.  Medications administered: Oneta E. Huizinga "Benjamine Mola" had no medications administered during this visit.  See the medical record for exact dosing, route, and time of administration.  New Prescriptions   No medications on file   Disposition: Discharge home  Discharge Date & Time: 10/23/2017;   hrs.   Physician-requested Follow-up: No follow-ups on file.  Future Appointments  Date Time Provider [place] Candal  10/23/2017  9:30 AM Milinda Pointer, MD [facility] None   Primary Care Physician: Herminio Commons, MD Location: [facility] Out[facility] Management Facility Note by: Gaspar Cola, MD Date: 10/23/2017; Time: 6:15 AM  Disclaimer:  Medicine is  not an Chief Strategy Officer. The only guarantee in medicine is that nothing is guaranteed. It is important to note that the decision to proceed with this intervention was based on the information collected from the patient. The Data and conclusions were drawn from the patient's questionnaire, the interview, and the physical examination. Because the information was provided in large part by the patient, it cannot be guaranteed that it has not been purposely or unconsciously manipulated. Every effort has been made to obtain as much relevant data as possible for this evaluation. It is important to note that the conclusions that lead to this procedure are derived in large part from the available data. Always take into account that the treatment will also be dependent on availability of resources and existing treatment guidelines, considered by other Pain Management Practitioners as being common knowledge and practice, at the time of the intervention. For Medico-Legal purposes, it is also important to point out that variation in procedural techniques and pharmacological choices are the acceptable norm. The indications, contraindications, technique, and results of the above procedure should only be interpreted and judged by a Board-Certified Interventional Pain Specialist with extensive familiarity and expertise in the same exact procedure and technique.

## 2017-11-13 ENCOUNTER — Telehealth: Payer: Self-pay

## 2017-11-13 NOTE — Telephone Encounter (Signed)
Tried calling patient. Left message to call back. 

## 2017-11-13 NOTE — Telephone Encounter (Signed)
Please advise 

## 2017-11-13 NOTE — Telephone Encounter (Signed)
Only accept her to treat medical issues. She needs to continue seeing psychiatrist and pain management. I don't treat either of those.

## 2017-11-13 NOTE — Telephone Encounter (Signed)
Patient has called wanting to know if you could see her for weight loss.  It looks like she has a doctor named Dr. Gwynneth Aliment as her PCP but the patient states she does not know why.  Also noted was a note from 7/5 asking if she could re-establish with you.  Please let her know if she is or can be a patient here.

## 2017-11-19 ENCOUNTER — Telehealth: Payer: Self-pay

## 2017-11-19 NOTE — Telephone Encounter (Signed)
Per Dr. Caryn Section, ok to re-establish care. Please schedule appointment. Thanks

## 2017-11-19 NOTE — Telephone Encounter (Signed)
Patient advised that Dr.Fisher said that he would only accept her for her medical issues but she needs to continue seeing the pain management and psychiatrist and patient agreed to that. Patient requesting appointment for weight loss. Is it ok to schedule?

## 2017-11-20 NOTE — Telephone Encounter (Signed)
Please contact patient to schedule appointment to re-establish care with Dr. Caryn Section. Thanks.

## 2017-12-01 ENCOUNTER — Ambulatory Visit (INDEPENDENT_AMBULATORY_CARE_PROVIDER_SITE_OTHER): Payer: Medicare Other | Admitting: Family Medicine

## 2017-12-01 ENCOUNTER — Encounter: Payer: Self-pay | Admitting: Family Medicine

## 2017-12-01 MED ORDER — PHENTERMINE HCL 37.5 MG PO CAPS
37.5000 mg | ORAL_CAPSULE | ORAL | 2 refills | Status: DC
Start: 2017-12-01 — End: 2018-02-19

## 2017-12-01 NOTE — Progress Notes (Signed)
Patient: Suzanne Moses Female    DOB: [birth year]   32 y.o.   MRN: [id number] Visit Date: 12/01/2017  Today's Provider: Lelon Huh, MD   Chief Complaint  Patient presents with  . Establish Care  . Obesity   Subjective:    HPI  Patient presents to re-establish care. She was last in March of 2015. She has had two pregnancies since then. Reviewed her past medical, social, surgical, and family history. Today.  Obesity: Patient complains of obesity. Patient cites health as reasons for wanting to lose weight. She had taken phentermine in the past and reports is worked well for her and she would like to restart medication. Denies any adverse effects from medication. She states she avoid high carbohydrate foods and tries to keep portion sizes small. She tried meat only diet which was effective, but too expensive.   Obesity History  Lowest adult weight: 165 Highest adult weight: 286 Amount of time at present weight: 286 lb.   History of Weight Loss Efforts Greatest amount of weight lost: 110 lb over 24 months  Circumstances associated with regain of weight: two pregnanies Successful weight loss techniques attempted: prescription appetite suppressants: walking, diet  changes. Unsuccessful weight loss techniques attempted: self-directed dieting and Keto  Current Exercise Habits none  Current Eating Habits Number of regular meals per day: 2 Number of snacking episodes per day: 3 Who shops for food? patient Who prepares food? patient Who eats with patient? patient and spouse Binge behavior?: yes - on the weekends Purge behavior? No But Pt reports she has done this in the past. Anorexic behavior? no Eating precipitated by stress? yes -  Guilt feelings associated with eating?       Allergies  Allergen Reactions  . Zofran [Ondansetron Hcl] Hives     Current Outpatient Medications:  .  ALPRAZolam (XANAX) 0.25 MG tablet, Take 0.25 mg by mouth 2 (two) times daily  as needed. , Disp: , Rfl:  .  amphetamine-dextroamphetamine (ADDERALL) 30 MG tablet, , Disp: , Rfl:  .  Calcium Carbonate-Vit D-Min (GNP CALCIUM 1200) 1200-1000 MG-UNIT CHEW, Chew 1,200 mg by mouth daily with breakfast. Take in combination with vitamin D and magnesium., Disp: 30 tablet, Rfl: 5 .  Cholecalciferol (VITAMIN D3) 5000 units CAPS, Take 1 capsule (5,000 Units total) by mouth daily with breakfast. Take along with calcium and magnesium., Disp: 30 capsule, Rfl: 5 .  etonogestrel (NEXPLANON) 68 MG IMPL implant, 1 each once by Subdermal route., Disp: , Rfl:  .  fluticasone (FLONASE) 50 MCG/ACT nasal spray, as needed, Disp: , Rfl:  .  ibuprofen (ADVIL,MOTRIN) 800 MG tablet, Take 1 tablet (800 mg total) by mouth every 8 (eight) hours as needed., Disp: 30 tablet, Rfl: 0 .  Magnesium 500 MG CAPS, Take 1 capsule (500 mg total) by mouth 2 (two) times daily at 8 am and 10 pm., Disp: 60 capsule, Rfl: 5 .  triamcinolone cream (KENALOG) 0.5 %, as directed, Disp: , Rfl:  .  ziprasidone (GEODON) 80 MG capsule, Take 80 mg by mouth 2 (two) times daily with a meal., Disp: , Rfl:  .  gabapentin (NEURONTIN) 100 MG capsule, Take 1-3 capsules (100-300 mg total) by mouth at bedtime. Follow written titration schedule., Disp: 90 capsule, Rfl: 0  Review of Systems  Constitutional: Negative.   Respiratory: Negative.   Cardiovascular: Negative.   Gastrointestinal: Negative.   Musculoskeletal: Positive for arthralgias (History of knee problems; Chronic issue. ).  Neurological: Negative  for dizziness, light-headedness and headaches.    Social History   Tobacco Use  . Smoking status: Current Every Day Smoker    Packs/day: 0.50    Types: Cigarettes, E-cigarettes  . Smokeless tobacco: Never Used  Substance Use Topics  . Alcohol use: No   Objective:   BP 122/70 (BP Location: Left Arm, Patient Position: Sitting, Cuff Size: Large)   Pulse (!) 108   Temp 98 F (36.7 C) (Oral)   Ht 5\' 3"  (1.6 m)   Wt 286 lb  (129.7 kg)   LMP 10/12/2017   SpO2 97%   BMI 50.66 kg/m     Physical Exam  General Appearance:    Alert, cooperative, no distress, morbidly obese  Eyes:    PERRL, conjunctiva/corneas clear, EOM's intact       Lungs:     Clear to auscultation bilaterally, respirations unlabored  Heart:    Regular rate and rhythm  Neurologic:   Awake, alert, oriented x 3. No apparent focal neurological           defect.          Assessment & Plan:     1. Morbid obesity ([place]) Reviewed healthy eating habits and regular exercise.  - phentermine 37.5 MG capsule; Take 1 capsule (37.5 mg total) by mouth every morning.  Dispense: 30 capsule; Refill: 2  Follow up about 2 months.        Lelon Huh, MD  [facility]

## 2017-12-05 ENCOUNTER — Encounter: Payer: Self-pay | Admitting: Emergency Medicine

## 2017-12-05 ENCOUNTER — Emergency Department
Admission: EM | Admit: 2017-12-05 | Discharge: 2017-12-05 | Disposition: A | Discharge status: Home / Self Care | Payer: Medicare Other | Attending: Emergency Medicine | Admitting: Emergency Medicine

## 2017-12-05 DIAGNOSIS — L739 Follicular disorder, unspecified: Secondary | ICD-10-CM | POA: Insufficient documentation

## 2017-12-05 DIAGNOSIS — L988 Other specified disorders of the skin and subcutaneous tissue: Secondary | ICD-10-CM | POA: Diagnosis present

## 2017-12-05 DIAGNOSIS — Z79899 Other long term (current) drug therapy: Secondary | ICD-10-CM | POA: Insufficient documentation

## 2017-12-05 DIAGNOSIS — F17210 Nicotine dependence, cigarettes, uncomplicated: Secondary | ICD-10-CM | POA: Insufficient documentation

## 2017-12-05 MED ORDER — SULFAMETHOXAZOLE-TRIMETHOPRIM 800-160 MG PO TABS: 1.0000 | ORAL_TABLET | Freq: Two times a day (BID) | ORAL | 0 refills | Status: AC

## 2017-12-05 NOTE — ED Notes (Signed)
RN went to discharge pt at this time and pt no longer in room. Pt contacted via phone call and instructions went over on the phone. Pt notified prescription at pharmacy [address], [place]. Paper left at front desk for pt to pick up tomorrow morning.
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
## 2017-12-05 NOTE — ED Triage Notes (Signed)
Pt to ED with c/o of 2 boils on legs.

## 2017-12-22 DIAGNOSIS — Z79899 Other long term (current) drug therapy: Secondary | ICD-10-CM | POA: Diagnosis not present

## 2017-12-22 DIAGNOSIS — F410 Panic disorder [episodic paroxysmal anxiety] without agoraphobia: Secondary | ICD-10-CM | POA: Diagnosis not present

## 2018-01-30 ENCOUNTER — Ambulatory Visit: Payer: Self-pay | Admitting: Family Medicine

## 2018-02-13 ENCOUNTER — Ambulatory Visit: Payer: Self-pay | Admitting: Family Medicine

## 2018-02-19 ENCOUNTER — Encounter: Payer: Self-pay | Admitting: Family Medicine

## 2018-02-19 ENCOUNTER — Ambulatory Visit: Payer: Self-pay | Admitting: Family Medicine

## 2018-02-19 ENCOUNTER — Ambulatory Visit (INDEPENDENT_AMBULATORY_CARE_PROVIDER_SITE_OTHER): Payer: Medicare Other | Admitting: Family Medicine

## 2018-02-19 MED ORDER — PHENTERMINE HCL 37.5 MG PO CAPS: 37.5000 mg | ORAL_CAPSULE | ORAL | 2 refills | Status: DC

## 2018-02-19 NOTE — Progress Notes (Signed)
Patient: Suzanne Moses Female    DOB: 04-16-1985   32 y.o.   MRN: [id number] Visit Date: 02/19/2018  Today's Provider: Lelon Huh, MD   Chief Complaint  Patient presents with  . Obesity   Subjective:    HPI    Obesity Patient presents today for obesity. Patient started Phentermine on 12/01/2017 and  denies any adverse effects from medication. Patient received counseling on importance of healthy eating habits and not regular exercise. Patient previous weight on 12/01/2017 was 286 lbs and weight today is 282.4 lbs.  Is not exercising. Is taking phentermine daily and feels it helps her appetite, but having minimal weight loss. She thinks the nexplanon is causing some of her weight gain and is planning on going to health dept to have it removed and change to OCP.   Wt Readings from Last 5 Encounters:  02/19/18 282 lb 6.4 oz (128.1 kg)  12/05/17 284 lb 6.3 oz (129 kg)  12/01/17 286 lb (129.7 kg)  09/18/17 270 lb (122.5 kg)  08/20/17 258 lb (117 kg)       Allergies  Allergen Reactions  . Zofran [Ondansetron Hcl] Hives     Current Outpatient Medications:  .  ALPRAZolam (XANAX) 0.25 MG tablet, Take 0.25 mg by mouth 2 (two) times daily as needed. , Disp: , Rfl:  .  amphetamine-dextroamphetamine (ADDERALL) 30 MG tablet, , Disp: , Rfl:  .  etonogestrel (NEXPLANON) 68 MG IMPL implant, 1 each once by Subdermal route., Disp: , Rfl:  .  fluticasone (FLONASE) 50 MCG/ACT nasal spray, as needed, Disp: , Rfl:  .  ibuprofen (ADVIL,MOTRIN) 800 MG tablet, Take 1 tablet (800 mg total) by mouth every 8 (eight) hours as needed., Disp: 30 tablet, Rfl: 0 .  phentermine 37.5 MG capsule, Take 1 capsule (37.5 mg total) by mouth every morning., Disp: 30 capsule, Rfl: 2 .  triamcinolone cream (KENALOG) 0.5 %, as directed, Disp: , Rfl:  .  ziprasidone (GEODON) 80 MG capsule, Take 80 mg by mouth 2 (two) times daily with a meal., Disp: , Rfl:  .  Calcium Carbonate-Vit D-Min (GNP CALCIUM  1200) 1200-1000 MG-UNIT CHEW, Chew 1,200 mg by mouth daily with breakfast. Take in combination with vitamin D and magnesium., Disp: 30 tablet, Rfl: 5 .  gabapentin (NEURONTIN) 100 MG capsule, Take 1-3 capsules (100-300 mg total) by mouth at bedtime. Follow written titration schedule., Disp: 90 capsule, Rfl: 0  Review of Systems  Constitutional: Negative.   HENT: Negative.   Respiratory: Negative.   Cardiovascular: Negative.   Gastrointestinal: Negative.   Genitourinary: Negative.   Musculoskeletal: Negative.   Neurological: Negative.     Social History   Tobacco Use  . Smoking status: Current Every Day Smoker    Packs/day: 0.50    Types: Cigarettes, E-cigarettes  . Smokeless tobacco: Never Used  Substance Use Topics  . Alcohol use: No   Objective:   BP 124/88 (BP Location: Right Arm, Patient Position: Sitting, Cuff Size: Normal)   Pulse (!) 108   Temp 97.7 F (36.5 C) (Oral)   Wt 282 lb 6.4 oz (128.1 kg)   SpO2 99%   BMI 50.02 kg/m  Vitals:   02/19/18 1126  BP: 124/88  Pulse: (!) 108  Temp: 97.7 F (36.5 C)  TempSrc: Oral  SpO2: 99%  Weight: 282 lb 6.4 oz (128.1 kg)     Physical Exam   General Appearance:    Alert, cooperative, no distress, morbidly obese.  Eyes:    PERRL, conjunctiva/corneas clear, EOM's intact       Lungs:     Clear to auscultation bilaterally, respirations unlabored  Heart:    Regular rate and rhythm  Neurologic:   Awake, alert, oriented x 3. No apparent focal neurological           defect.           Assessment & Plan:     1. Morbid obesity ([place]) Chronic, but may be exacerbated by medications including nexplanon. She is going to have it removed and start back on orals which she states did not effect her weight refill- phentermine 37.5 MG capsule; Take 1 capsule (37.5 mg total) by mouth every morning.  Dispense: 30 capsule; Refill: 2  Consider trial of belviq. Consider referral for bariatric surgery.       Lelon Huh, MD    [facility]

## 2018-02-19 NOTE — Patient Instructions (Addendum)
   Check on insurance coverage for Belvic (lorcaserin)

## 2018-02-26 DIAGNOSIS — Z30011 Encounter for initial prescription of contraceptive pills: Secondary | ICD-10-CM | POA: Diagnosis not present

## 2018-02-26 DIAGNOSIS — Z3009 Encounter for other general counseling and advice on contraception: Secondary | ICD-10-CM | POA: Diagnosis not present

## 2018-02-26 DIAGNOSIS — Z01419 Encounter for gynecological examination (general) (routine) without abnormal findings: Secondary | ICD-10-CM | POA: Diagnosis not present

## 2018-02-26 DIAGNOSIS — Z113 Encounter for screening for infections with a predominantly sexual mode of transmission: Secondary | ICD-10-CM | POA: Diagnosis not present

## 2018-03-16 DIAGNOSIS — F900 Attention-deficit hyperactivity disorder, predominantly inattentive type: Secondary | ICD-10-CM | POA: Diagnosis not present

## 2018-03-16 DIAGNOSIS — Z79899 Other long term (current) drug therapy: Secondary | ICD-10-CM | POA: Diagnosis not present

## 2018-03-25 DIAGNOSIS — F410 Panic disorder [episodic paroxysmal anxiety] without agoraphobia: Secondary | ICD-10-CM | POA: Diagnosis not present

## 2018-03-25 DIAGNOSIS — Z79899 Other long term (current) drug therapy: Secondary | ICD-10-CM | POA: Diagnosis not present

## 2018-04-02 DIAGNOSIS — F411 Generalized anxiety disorder: Secondary | ICD-10-CM | POA: Diagnosis not present

## 2018-04-02 DIAGNOSIS — Z79899 Other long term (current) drug therapy: Secondary | ICD-10-CM | POA: Diagnosis not present

## 2018-05-22 ENCOUNTER — Ambulatory Visit (INDEPENDENT_AMBULATORY_CARE_PROVIDER_SITE_OTHER): Payer: Medicare Other | Admitting: Family Medicine

## 2018-05-22 ENCOUNTER — Encounter: Payer: Self-pay | Admitting: Family Medicine

## 2018-05-22 DIAGNOSIS — H1031 Unspecified acute conjunctivitis, right eye: Secondary | ICD-10-CM

## 2018-05-22 MED ORDER — CIPROFLOXACIN HCL 0.3 % OP SOLN: 1.0000 [drp] | OPHTHALMIC | 0 refills | Status: AC

## 2018-05-22 MED ORDER — PHENTERMINE HCL 37.5 MG PO CAPS: 37.5000 mg | ORAL_CAPSULE | ORAL | 2 refills | Status: DC

## 2018-05-22 MED ORDER — TOPIRAMATE 50 MG PO TABS: ORAL_TABLET | ORAL | 3 refills | Status: DC

## 2018-05-22 NOTE — Progress Notes (Signed)
Patient: Suzanne Moses Female    DOB: March 21, 1986   33 y.o.   MRN: [id number] Visit Date: 05/22/2018  Today's Provider: Lelon Huh, MD   Chief Complaint  Patient presents with  . Obesity   Subjective:     HPI  Follow up for Obesity:  The patient was last seen for this 3 months ago. Changes made at last visit include starting Phentermine 37.5mg  daily.  She reports good compliance with treatment. She feels that condition is Improved. She is not having side effects. Patient has not been exercising regularly. She has  a 4 pound weight loss since last office visit.   Wt Readings from Last 5 Encounters:  05/22/18 278 lb (126.1 kg)  02/19/18 282 lb 6.4 oz (128.1 kg)  12/05/17 284 lb 6.3 oz (129 kg)  12/01/17 286 lb (129.7 kg)  09/18/17 270 lb (122.5 kg)    ------------------------------------------------------------------------------------  She also reports intermittent blurriness in right eye for a few months, but has felt sore and swollen the last couple of days. Small amount of clear discharge.   Allergies  Allergen Reactions  . Zofran [Ondansetron Hcl] Hives     Current Outpatient Medications:  .  ALPRAZolam (XANAX) 0.25 MG tablet, Take 0.25 mg by mouth 2 (two) times daily as needed. , Disp: , Rfl:  .  amphetamine-dextroamphetamine (ADDERALL) 30 MG tablet, , Disp: , Rfl:  .  fluticasone (FLONASE) 50 MCG/ACT nasal spray, as needed, Disp: , Rfl:  .  ibuprofen (ADVIL,MOTRIN) 800 MG tablet, Take 1 tablet (800 mg total) by mouth every 8 (eight) hours as needed., Disp: 30 tablet, Rfl: 0 .  phentermine 37.5 MG capsule, Take 1 capsule (37.5 mg total) by mouth every morning., Disp: 30 capsule, Rfl: 2 .  triamcinolone cream (KENALOG) 0.5 %, as directed, Disp: , Rfl:  .  ziprasidone (GEODON) 80 MG capsule, Take 80 mg by mouth 2 (two) times daily with a meal., Disp: , Rfl:  .  Calcium Carbonate-Vit D-Min (GNP CALCIUM 1200) 1200-1000 MG-UNIT CHEW, Chew 1,200 mg by  mouth daily with breakfast. Take in combination with vitamin D and magnesium., Disp: 30 tablet, Rfl: 5 .  gabapentin (NEURONTIN) 100 MG capsule, Take 1-3 capsules (100-300 mg total) by mouth at bedtime. Follow written titration schedule., Disp: 90 capsule, Rfl: 0  Review of Systems  Constitutional: Negative for appetite change, chills, fatigue and fever.  Eyes: Positive for pain (right eye; started today) and visual disturbance (blurred vision in right eye x 2 months).  Respiratory: Negative for chest tightness and shortness of breath.   Cardiovascular: Negative for chest pain and palpitations.  Gastrointestinal: Negative for abdominal pain, nausea and vomiting.  Neurological: Negative for dizziness and weakness.    Social History   Tobacco Use  . Smoking status: Current Every Day Smoker    Packs/day: 0.50    Types: Cigarettes  . Smokeless tobacco: Never Used  Substance Use Topics  . Alcohol use: No      Objective:   BP 138/69 (BP Location: Left Wrist, Patient Position: Sitting, Cuff Size: Large)   Pulse 93   Temp 98 F (36.7 C) (Oral)   Resp 18   Ht 5\' 3"  (1.6 m)   Wt 278 lb (126.1 kg)   SpO2 98% Comment: room air  BMI 49.25 kg/m     Physical Exam  General Appearance:    Alert, cooperative, no distress  HENT:   ENT exam normal, no neck nodes or sinus  tenderness  Eyes:    PERRL, right lower palpebral conjunctiva inflamed, no discharge, EOM's intact       Neurologic:   Awake, alert, oriented x 3. No apparent focal neurological           defect.          Assessment & Plan    1. Morbid obesity ([place]) Minimal weight loss with phentermine. Is likely tolerant to stimulant medications. Discussed effectiveness and potential adverse effects of adding topiramate toe phentermine which she would like to try.  - topiramate (TOPAMAX) 50 MG tablet; 1/2 tablet every morning for two weeks, then increase to 1 tablet every morning  Dispense: 30 tablet; Refill: 3 refill- phentermine  37.5 MG capsule; Take 1 capsule (37.5 mg total) by mouth every morning.  Dispense: 30 capsule; Refill: 2  Consider titrating up topiramate at follow up if tolerating. Consider weaning phentermine if topiramate is effective for weight loss.   2. Acute conjunctivitis of right eye, unspecified acute conjunctivitis type  - ciprofloxacin (CILOXAN) 0.3 % ophthalmic solution; Place 1 drop into both eyes every 4 (four) hours while awake for 7 days.  Dispense: 10 mL; Refill: 0  Call if symptoms change or if not rapidly improving.        Lelon Huh, MD  [facility]

## 2018-05-22 NOTE — Patient Instructions (Signed)
.   Please review the attached list of medications and notify my office if there are any errors.   . Please bring all of your medications to every appointment so we can make sure that our medication list is the same as yours.   

## 2018-06-11 DIAGNOSIS — F17200 Nicotine dependence, unspecified, uncomplicated: Secondary | ICD-10-CM | POA: Diagnosis not present

## 2018-06-11 DIAGNOSIS — F4310 Post-traumatic stress disorder, unspecified: Secondary | ICD-10-CM | POA: Diagnosis not present

## 2018-06-11 DIAGNOSIS — F315 Bipolar disorder, current episode depressed, severe, with psychotic features: Secondary | ICD-10-CM | POA: Diagnosis not present

## 2018-06-23 DIAGNOSIS — Z79891 Long term (current) use of opiate analgesic: Secondary | ICD-10-CM | POA: Diagnosis not present

## 2018-06-23 DIAGNOSIS — F902 Attention-deficit hyperactivity disorder, combined type: Secondary | ICD-10-CM | POA: Diagnosis not present

## 2018-06-23 DIAGNOSIS — F411 Generalized anxiety disorder: Secondary | ICD-10-CM | POA: Diagnosis not present

## 2018-08-25 ENCOUNTER — Ambulatory Visit: Payer: Self-pay | Admitting: Family Medicine

## 2018-09-07 ENCOUNTER — Emergency Department: Payer: Medicare Other

## 2018-09-07 ENCOUNTER — Other Ambulatory Visit: Payer: Self-pay

## 2018-09-07 ENCOUNTER — Emergency Department
Admission: EM | Admit: 2018-09-07 | Discharge: 2018-09-07 | Disposition: A | Discharge status: Home / Self Care | Payer: Medicare Other | Attending: Emergency Medicine | Admitting: Emergency Medicine

## 2018-09-07 DIAGNOSIS — Y9389 Activity, other specified: Secondary | ICD-10-CM | POA: Insufficient documentation

## 2018-09-07 DIAGNOSIS — S060X0A Concussion without loss of consciousness, initial encounter: Secondary | ICD-10-CM | POA: Insufficient documentation

## 2018-09-07 DIAGNOSIS — S0990XA Unspecified injury of head, initial encounter: Secondary | ICD-10-CM | POA: Diagnosis present

## 2018-09-07 DIAGNOSIS — F419 Anxiety disorder, unspecified: Secondary | ICD-10-CM | POA: Diagnosis not present

## 2018-09-07 DIAGNOSIS — Y9289 Other specified places as the place of occurrence of the external cause: Secondary | ICD-10-CM | POA: Insufficient documentation

## 2018-09-07 DIAGNOSIS — F1510 Other stimulant abuse, uncomplicated: Secondary | ICD-10-CM | POA: Insufficient documentation

## 2018-09-07 DIAGNOSIS — R112 Nausea with vomiting, unspecified: Secondary | ICD-10-CM | POA: Diagnosis not present

## 2018-09-07 DIAGNOSIS — R51 Headache: Secondary | ICD-10-CM | POA: Diagnosis not present

## 2018-09-07 DIAGNOSIS — F17210 Nicotine dependence, cigarettes, uncomplicated: Secondary | ICD-10-CM | POA: Diagnosis not present

## 2018-09-07 DIAGNOSIS — Z79899 Other long term (current) drug therapy: Secondary | ICD-10-CM | POA: Diagnosis not present

## 2018-09-07 DIAGNOSIS — Y998 Other external cause status: Secondary | ICD-10-CM | POA: Diagnosis not present

## 2018-09-07 LAB — POCT PREGNANCY, URINE: Preg Test, Ur: NEGATIVE

## 2018-09-07 MED ORDER — KETOROLAC TROMETHAMINE 15 MG/ML IJ SOLN
15.0000 mg | Freq: Once | INTRAMUSCULAR | Status: DC
  Filled 2018-09-07: qty 1

## 2018-09-07 MED ORDER — PROMETHAZINE HCL 25 MG PO TABS: 25.0000 mg | ORAL_TABLET | Freq: Three times a day (TID) | ORAL | 0 refills | Status: DC | PRN

## 2018-09-07 MED ORDER — KETOROLAC TROMETHAMINE 30 MG/ML IJ SOLN
15.0000 mg | Freq: Once | INTRAMUSCULAR | Status: AC
  Administered 2018-09-07: 15 mg via INTRAVENOUS

## 2018-09-07 MED ORDER — KETOROLAC TROMETHAMINE 30 MG/ML IJ SOLN
INTRAMUSCULAR | Status: AC
  Administered 2018-09-07: 15 mg via INTRAVENOUS
  Filled 2018-09-07: qty 1

## 2018-09-07 MED ORDER — PROCHLORPERAZINE EDISYLATE 10 MG/2ML IJ SOLN
10.0000 mg | Freq: Once | INTRAMUSCULAR | Status: AC
  Administered 2018-09-07: 10 mg via INTRAVENOUS
  Filled 2018-09-07: qty 2

## 2018-09-07 MED ORDER — SODIUM CHLORIDE 0.9 % IV BOLUS
1000.0000 mL | Freq: Once | INTRAVENOUS | Status: AC
  Administered 2018-09-07: 1000 mL via INTRAVENOUS

## 2018-09-07 MED ORDER — DIPHENHYDRAMINE HCL 50 MG/ML IJ SOLN
25.0000 mg | Freq: Once | INTRAMUSCULAR | Status: AC
  Administered 2018-09-07: 25 mg via INTRAVENOUS
  Filled 2018-09-07: qty 1
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
## 2018-09-10 ENCOUNTER — Other Ambulatory Visit: Payer: Self-pay | Admitting: Family Medicine

## 2018-09-30 ENCOUNTER — Ambulatory Visit: Payer: Self-pay | Admitting: Family Medicine

## 2018-12-04 ENCOUNTER — Other Ambulatory Visit: Payer: Self-pay

## 2018-12-04 MED ORDER — PHENTERMINE HCL 37.5 MG PO TABS: 37.5000 mg | ORAL_TABLET | Freq: Every morning | ORAL | 2 refills | Status: DC

## 2018-12-04 NOTE — Telephone Encounter (Signed)
Patient is requesting a refill on Phetermine 37.5 mg

## 2019-03-05 ENCOUNTER — Other Ambulatory Visit: Payer: Self-pay | Admitting: Family Medicine

## 2019-03-05 MED ORDER — PHENTERMINE HCL 37.5 MG PO TABS: 37.5000 mg | ORAL_TABLET | Freq: Every morning | ORAL | 2 refills | Status: DC

## 2019-03-05 NOTE — Telephone Encounter (Signed)
rx refill phentermine (ADIPEX-P) 37.5 MG tablet   [place], [place] - [place] [phone] (Phone) [phone] (Fax)

## 2019-03-19 DIAGNOSIS — Z1389 Encounter for screening for other disorder: Secondary | ICD-10-CM | POA: Diagnosis not present

## 2019-03-19 DIAGNOSIS — E669 Obesity, unspecified: Secondary | ICD-10-CM | POA: Diagnosis not present

## 2019-03-19 DIAGNOSIS — K429 Umbilical hernia without obstruction or gangrene: Secondary | ICD-10-CM | POA: Diagnosis not present

## 2019-03-19 DIAGNOSIS — L853 Xerosis cutis: Secondary | ICD-10-CM | POA: Diagnosis not present

## 2019-06-10 ENCOUNTER — Other Ambulatory Visit: Payer: Self-pay | Admitting: Family Medicine

## 2019-06-10 NOTE — Telephone Encounter (Signed)
Requested medication (s) are due for refill today: yes  Requested medication (s) are on the active medication list: yes  Last refill:  05/11/19  Future visit scheduled: no  Notes to clinic: no valid encounter within last 12 months   Requested Prescriptions  Pending Prescriptions Disp Refills   phentermine (ADIPEX-P) 37.5 MG tablet [Pharmacy Med Name: phentermine 37.5 mg tablet] 30 tablet 2    Sig: TAKE ONE TABLET BY MOUTH EVERY MORNING      Not Delegated - Gastroenterology:  Antiobesity Agents Failed - 06/10/2019  8:08 AM      Failed - This refill cannot be delegated      Failed - Valid encounter within last 12 months    Recent Outpatient Visits           1 year ago Morbid obesity [facility]   [facility] Birdie Sons, MD   1 year ago Morbid obesity [facility])   [facility] Birdie Sons, MD   1 year ago Morbid obesity [facility])   [facility] Birdie Sons, MD              Passed - Last BP in normal range    BP Readings from Last 1 Encounters:  09/07/18 125/76          Passed - Last Heart Rate in normal range    Pulse Readings from Last 1 Encounters:  09/07/18 68

## 2019-06-10 NOTE — Telephone Encounter (Signed)
Attempted to contact pt to discuss need of appt for prescription refill; message states the number is not available; unable to leave message; will route to office for final disposition.

## 2019-06-10 NOTE — Telephone Encounter (Signed)
[facility] faxed refill request for the following medications:  phentermine (ADIPEX-P) 37.5 MG tablet  Last Rx: 03/05/2019 with 2 refills LOV: 05/22/2018 Please advise. Thanks TNP

## 2019-08-18 DIAGNOSIS — E669 Obesity, unspecified: Secondary | ICD-10-CM | POA: Diagnosis not present

## 2019-08-18 DIAGNOSIS — K429 Umbilical hernia without obstruction or gangrene: Secondary | ICD-10-CM | POA: Diagnosis not present

## 2019-09-30 ENCOUNTER — Other Ambulatory Visit: Payer: Self-pay | Admitting: Family Medicine

## 2019-09-30 NOTE — Telephone Encounter (Signed)
Requested medication (s) are due for refill today: yes  Requested medication (s) are on the active medication list: yes  Last refill:  08/31/2019  Future visit scheduled: no  Notes to clinic:  This refill cannot be delegated   Requested Prescriptions  Pending Prescriptions Disp Refills   phentermine (ADIPEX-P) 37.5 MG tablet [Pharmacy Med Name: phentermine 37.5 mg tablet] 30 tablet 2    Sig: TAKE ONE TABLET BY MOUTH EVERY MORNING      Not Delegated - Gastroenterology:  Antiobesity Agents Failed - 09/30/2019  8:00 AM      Failed - This refill cannot be delegated      Failed - Valid encounter within last 12 months    Recent Outpatient Visits           1 year ago Morbid obesity [facility])   [facility] Birdie Sons, MD   1 year ago Morbid obesity [facility])   [facility] Birdie Sons, MD   1 year ago Morbid obesity [facility])   [facility] Birdie Sons, MD              Passed - Last BP in normal range    BP Readings from Last 1 Encounters:  09/07/18 125/76          Passed - Last Heart Rate in normal range    Pulse Readings from Last 1 Encounters:  09/07/18 68

## 2019-10-05 ENCOUNTER — Other Ambulatory Visit: Payer: Self-pay | Admitting: Family Medicine

## 2019-10-05 NOTE — Telephone Encounter (Signed)
Requested medication (s) are due for refill today: yes  Requested medication (s) are on the active medication list: yes  Last refill:  06/10/19  #30 2 refills  Future visit scheduled:No  Notes to clinic: Attempted to contact patient to schedule appointment. Number is restricted and I was unable to leave message Last OV 05/22/18    Requested Prescriptions  Pending Prescriptions Disp Refills   phentermine (ADIPEX-P) 37.5 MG tablet [Pharmacy Med Name: phentermine 37.5 mg tablet] 30 tablet 2    Sig: TAKE ONE TABLET BY MOUTH EVERY MORNING      Not Delegated - Gastroenterology:  Antiobesity Agents Failed - 10/05/2019 11:01 AM      Failed - This refill cannot be delegated      Failed - Valid encounter within last 12 months    Recent Outpatient Visits           1 year ago Morbid obesity [facility])   [facility] Birdie Sons, MD   1 year ago Morbid obesity [facility])   [facility] Birdie Sons, MD   1 year ago Morbid obesity [facility])   [facility] Birdie Sons, MD              Passed - Last BP in normal range    BP Readings from Last 1 Encounters:  09/07/18 125/76          Passed - Last Heart Rate in normal range    Pulse Readings from Last 1 Encounters:  09/07/18 68

## 2019-11-26 ENCOUNTER — Other Ambulatory Visit: Payer: Self-pay | Admitting: Family Medicine

## 2019-11-26 NOTE — Telephone Encounter (Signed)
PT need a refill  phentermine (ADIPEX-P) 37.5 MG tablet [[id number]]  [place], [place] [address]  [address] [place] [address]  Phone: [phone] Fax: [phone]

## 2019-11-26 NOTE — Telephone Encounter (Signed)
Requested medication (s) are due for refill today: {no  Requested medication (s) are on the active medication list: yes  Last refill:  06/10/2019  Future visit scheduled: no  Notes to clinic:  this refill cannot be delegated    Requested Prescriptions  Pending Prescriptions Disp Refills   phentermine (ADIPEX-P) 37.5 MG tablet 30 tablet 2    Sig: Take 1 tablet (37.5 mg total) by mouth every morning.      Not Delegated - Gastroenterology:  Antiobesity Agents Failed - 11/26/2019  1:25 PM      Failed - This refill cannot be delegated      Failed - Valid encounter within last 12 months    Recent Outpatient Visits           1 year ago Morbid obesity [facility])   [facility] Birdie Sons, MD   1 year ago Morbid obesity [facility])   [facility] Birdie Sons, MD   1 year ago Morbid obesity [facility])   [facility] Birdie Sons, MD              Passed - Last BP in normal range    BP Readings from Last 1 Encounters:  09/07/18 125/76          Passed - Last Heart Rate in normal range    Pulse Readings from Last 1 Encounters:  09/07/18 68

## 2019-11-26 NOTE — Addendum Note (Signed)
Addended by: Jefferson Fuel on: 11/26/2019 01:25 PM   Modules accepted: Orders

## 2019-12-01 ENCOUNTER — Other Ambulatory Visit: Payer: Self-pay | Admitting: Family Medicine

## 2019-12-01 NOTE — Telephone Encounter (Signed)
Requested medication (s) are due for refill today: no  Requested medication (s) are on the active medication list: yes  Last refill:  06/10/2019  Future visit scheduled:no  Notes to clinic: this refill cannot be delegated    Requested Prescriptions  Pending Prescriptions Disp Refills   phentermine (ADIPEX-P) 37.5 MG tablet 30 tablet 2    Sig: Take 1 tablet (37.5 mg total) by mouth every morning.      Not Delegated - Gastroenterology:  Antiobesity Agents Failed - 12/01/2019  1:01 PM      Failed - This refill cannot be delegated      Failed - Valid encounter within last 12 months    Recent Outpatient Visits           1 year ago Morbid obesity [facility])   [facility] Birdie Sons, MD   1 year ago Morbid obesity [facility])   [facility] Birdie Sons, MD   2 years ago Morbid obesity [facility])   [facility] Birdie Sons, MD              Passed - Last BP in normal range    BP Readings from Last 1 Encounters:  09/07/18 125/76          Passed - Last Heart Rate in normal range    Pulse Readings from Last 1 Encounters:  09/07/18 68

## 2019-12-01 NOTE — Telephone Encounter (Signed)
Medication Refill - Medication: phentermine (ADIPEX-P) 37.5 MG tablet   Has the patient contacted their pharmacy? yes (Agent: If no, request that the patient contact the pharmacy for the refill.) (Agent: If yes, when and what did the pharmacy advise?)Contact PCP  Preferred Pharmacy (with phone number or street name):  [facility] [place], [place] Phone:  [phone]  Fax:  [phone]       Agent: Please be advised that RX refills may take up to 3 business days. We ask that you follow-up with your pharmacy.

## 2019-12-01 NOTE — Telephone Encounter (Signed)
She's overdue for follow up office visit.

## 2019-12-20 ENCOUNTER — Emergency Department
Admission: EM | Admit: 2019-12-20 | Discharge: 2019-12-20 | Disposition: A | Discharge status: Home / Self Care | Payer: Medicare Other | Attending: Emergency Medicine | Admitting: Emergency Medicine

## 2019-12-20 ENCOUNTER — Encounter: Payer: Self-pay | Admitting: Emergency Medicine

## 2019-12-20 ENCOUNTER — Other Ambulatory Visit: Payer: Self-pay

## 2019-12-20 ENCOUNTER — Emergency Department: Payer: Medicare Other

## 2019-12-20 DIAGNOSIS — R932 Abnormal findings on diagnostic imaging of liver and biliary tract: Secondary | ICD-10-CM | POA: Diagnosis not present

## 2019-12-20 DIAGNOSIS — Z20822 Contact with and (suspected) exposure to covid-19: Secondary | ICD-10-CM | POA: Insufficient documentation

## 2019-12-20 DIAGNOSIS — F17210 Nicotine dependence, cigarettes, uncomplicated: Secondary | ICD-10-CM | POA: Insufficient documentation

## 2019-12-20 DIAGNOSIS — R112 Nausea with vomiting, unspecified: Secondary | ICD-10-CM | POA: Insufficient documentation

## 2019-12-20 DIAGNOSIS — Z96641 Presence of right artificial hip joint: Secondary | ICD-10-CM | POA: Diagnosis not present

## 2019-12-20 DIAGNOSIS — K839 Disease of biliary tract, unspecified: Secondary | ICD-10-CM | POA: Diagnosis not present

## 2019-12-20 DIAGNOSIS — R1011 Right upper quadrant pain: Secondary | ICD-10-CM

## 2019-12-20 DIAGNOSIS — Z79899 Other long term (current) drug therapy: Secondary | ICD-10-CM | POA: Diagnosis not present

## 2019-12-20 DIAGNOSIS — K8020 Calculus of gallbladder without cholecystitis without obstruction: Secondary | ICD-10-CM | POA: Diagnosis not present

## 2019-12-20 DIAGNOSIS — I10 Essential (primary) hypertension: Secondary | ICD-10-CM | POA: Diagnosis not present

## 2019-12-20 DIAGNOSIS — K760 Fatty (change of) liver, not elsewhere classified: Secondary | ICD-10-CM

## 2019-12-20 DIAGNOSIS — K838 Other specified diseases of biliary tract: Secondary | ICD-10-CM

## 2019-12-20 LAB — URINALYSIS, COMPLETE (UACMP) WITH MICROSCOPIC
Bacteria, UA: NONE SEEN
Bilirubin Urine: NEGATIVE
Glucose, UA: NEGATIVE mg/dL
Hgb urine dipstick: NEGATIVE
Ketones, ur: NEGATIVE mg/dL
Leukocytes,Ua: NEGATIVE
Nitrite: NEGATIVE
Protein, ur: NEGATIVE mg/dL
Specific Gravity, Urine: 1.014 (ref 1.005–1.030)
WBC, UA: NONE SEEN WBC/hpf (ref 0–5)
pH: 6 (ref 5.0–8.0)

## 2019-12-20 LAB — COMPREHENSIVE METABOLIC PANEL
ALT: 32 U/L (ref 0–44)
AST: 23 U/L (ref 15–41)
Albumin: 3.9 g/dL (ref 3.5–5.0)
Alkaline Phosphatase: 68 U/L (ref 38–126)
Anion gap: 8 (ref 5–15)
BUN: 10 mg/dL (ref 6–20)
CO2: 30 mmol/L (ref 22–32)
Calcium: 9.1 mg/dL (ref 8.9–10.3)
Chloride: 101 mmol/L (ref 98–111)
Creatinine, Ser: 0.63 mg/dL (ref 0.44–1.00)
GFR calc Af Amer: >60 mL/min (ref >60)
GFR calc non Af Amer: >60 mL/min (ref >60)
Glucose, Bld: 110 mg/dL — ABNORMAL HIGH (ref 70–99)
Potassium: 4.4 mmol/L (ref 3.5–5.1)
Sodium: 139 mmol/L (ref 135–145)
Total Bilirubin: 0.4 mg/dL (ref 0.3–1.2)
Total Protein: 7.3 g/dL (ref 6.5–8.1)

## 2019-12-20 LAB — CBC
HCT: 40.3 % (ref 36.0–46.0)
Hemoglobin: 13 g/dL (ref 12.0–15.0)
MCH: 27.5 pg (ref 26.0–34.0)
MCHC: 32.3 g/dL (ref 30.0–36.0)
MCV: 85.2 fL (ref 80.0–100.0)
Platelets: 260 10*3/uL (ref 150–400)
RBC: 4.73 MIL/uL (ref 3.87–5.11)
RDW: 15.4 % (ref 11.5–15.5)
WBC: 12.7 10*3/uL — ABNORMAL HIGH (ref 4.0–10.5)
nRBC: 0 % (ref 0.0–0.2)

## 2019-12-20 LAB — LIPASE, BLOOD: Lipase: 25 U/L (ref 11–51)

## 2019-12-20 LAB — POCT PREGNANCY, URINE: Preg Test, Ur: NEGATIVE

## 2019-12-20 LAB — SARS CORONAVIRUS 2 BY RT PCR (HOSPITAL ORDER, PERFORMED IN ~~LOC~~ HOSPITAL LAB): SARS Coronavirus 2: NEGATIVE
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
## 2019-12-20 NOTE — ED Notes (Signed)
Pt presents to the ED for RUQ abdominal pain that has been intermittent for the last 9 days. Pt also c/o NV also. Denies fever. Pt states she was suppose to get her gall bladder removed 4 years ago but didn't have pain like since then. Denies diarrhea. Pt is A&Ox4 and NAD. VSS.

## 2019-12-20 NOTE — ED Triage Notes (Signed)
Patient presents to the ED with episodes of diarrhea, vomiting and abdominal pain x 9 days, lasting anywhere from 2 hours to 2 days.  Patient states this morning she was unable to get her 34 year old ready for school.  Patient states the pain is to her right upper quadrant.  Patient states pain starts after she has a bowel movement when she wakes up in the morning.

## 2020-02-02 ENCOUNTER — Encounter: Payer: Self-pay | Admitting: Adult Health

## 2020-02-02 ENCOUNTER — Telehealth (INDEPENDENT_AMBULATORY_CARE_PROVIDER_SITE_OTHER): Payer: Medicare Other | Admitting: Adult Health

## 2020-02-02 DIAGNOSIS — L259 Unspecified contact dermatitis, unspecified cause: Secondary | ICD-10-CM | POA: Insufficient documentation

## 2020-02-02 DIAGNOSIS — R21 Rash and other nonspecific skin eruption: Secondary | ICD-10-CM | POA: Diagnosis not present

## 2020-02-02 DIAGNOSIS — B029 Zoster without complications: Secondary | ICD-10-CM | POA: Insufficient documentation

## 2020-02-02 HISTORY — DX: Zoster without complications: B02.9

## 2020-02-02 MED ORDER — VALACYCLOVIR HCL 1 G PO TABS: 1000.0000 mg | ORAL_TABLET | Freq: Three times a day (TID) | ORAL | 0 refills | Status: DC

## 2020-02-02 MED ORDER — PREDNISONE 10 MG (21) PO TBPK: ORAL_TABLET | ORAL | 0 refills | Status: DC

## 2020-02-02 NOTE — Patient Instructions (Signed)
Rash, Adult  A rash is a change in the color of your skin. A rash can also change the way your skin feels. There are many different conditions and factors that can cause a rash. Follow these instructions at home: The goal of treatment is to stop the itching and keep the rash from spreading. Watch for any changes in your symptoms. Let your doctor know about them. Follow these instructions to help with your condition: Medicine Take or apply over-the-counter and prescription medicines only as told by your doctor. These may include medicines:  To treat red or swollen skin (corticosteroid creams).  To treat itching.  To treat an allergy (oral antihistamines).  To treat very bad symptoms (oral corticosteroids).  Skin care  Put cool cloths (compresses) on the affected areas.  Do not scratch or rub your skin.  Avoid covering the rash. Make sure that the rash is exposed to air as much as possible. Managing itching and discomfort  Avoid hot showers or baths. These can make itching worse. A cold shower may help.  Try taking a bath with: ? Epsom salts. You can get these at your local pharmacy or grocery store. Follow the instructions on the package. ? Baking soda. Pour a small amount into the bath as told by your doctor. ? Colloidal oatmeal. You can get this at your local pharmacy or grocery store. Follow the instructions on the package.  Try putting baking soda paste onto your skin. Stir water into baking soda until it gets like a paste.  Try putting on a lotion that relieves itchiness (calamine lotion).  Keep cool and out of the sun. Sweating and being hot can make itching worse. General instructions   Rest as needed.  Drink enough fluid to keep your pee (urine) pale yellow.  Wear loose-fitting clothing.  Avoid scented soaps, detergents, and perfumes. Use gentle soaps, detergents, perfumes, and other cosmetic products.  Avoid anything that causes your rash. Keep a journal to  help track what causes your rash. Write down: ? What you eat. ? What cosmetic products you use. ? What you drink. ? What you wear. This includes jewelry.  Keep all follow-up visits as told by your doctor. This is important. Contact a doctor if:  You sweat at night.  You lose weight.  You pee (urinate) more than normal.  You pee less than normal, or you notice that your pee is a darker color than normal.  You feel weak.  You throw up (vomit).  Your skin or the whites of your eyes look yellow (jaundice).  Your skin: ? Tingles. ? Is numb.  Your rash: ? Does not go away after a few days. ? Gets worse.  You are: ? More thirsty than normal. ? More tired than normal.  You have: ? New symptoms. ? Pain in your belly (abdomen). ? A fever. ? Watery poop (diarrhea). Get help right away if:  You have a fever and your symptoms suddenly get worse.  You start to feel mixed up (confused).  You have a very bad headache or a stiff neck.  You have very bad joint pains or stiffness.  You have jerky movements that you cannot control (seizure).  Your rash covers all or most of your body. The rash may or may not be painful.  You have blisters that: ? Are on top of the rash. ? Grow larger. ? Grow together. ? Are painful. ? Are inside your nose or mouth.  You have a rash   that: ? Looks like purple pinprick-sized spots all over your body. ? Has a "bull's eye" or looks like a target. ? Is red and painful, causes your skin to peel, and is not from being in the sun too long. Summary  A rash is a change in the color of your skin. A rash can also change the way your skin feels.  The goal of treatment is to stop the itching and keep the rash from spreading.  Take or apply over-the-counter and prescription medicines only as told by your doctor.  Contact a doctor if you have new symptoms or symptoms that get worse.  Keep all follow-up visits as told by your doctor. This is  important. This information is not intended to replace advice given to you by your health care provider. Make sure you discuss any questions you have with your health care provider. Document Revised: 07/17/2018 Document Reviewed: 10/27/2017 Elsevier Patient Education  [place] is an infection. It gives you a painful skin rash and blisters that have fluid in them. Shingles is caused by the same germ (virus) that causes chickenpox. Shingles only happens in people who:  Have had chickenpox.  Have been given a shot of medicine (vaccine) to protect against chickenpox. Shingles is rare in this group. The first symptoms of shingles may be itching, tingling, or pain in an area on your skin. A rash will show on your skin a few days or weeks later. The rash is likely to be on one side of your body. The rash usually has a shape like a belt or a band. Over time, the rash turns into fluid-filled blisters. The blisters will break open, change into scabs, and dry up. Medicines may:  Help with pain and itching.  Help you get better sooner.  Help to prevent long-term problems. Follow these instructions at home: Medicines  Take over-the-counter and prescription medicines only as told by your doctor.  Put on an anti-itch cream or numbing cream where you have a rash, blisters, or scabs. Do this as told by your doctor. Helping with itching and discomfort   Put cold, wet cloths (cold compresses) on the area of the rash or blisters as told by your doctor.  Cool baths can help you feel better. Try adding baking soda or dry oatmeal to the water to lessen itching. Do not bathe in hot water. Blister and rash care  Keep your rash covered with a loose bandage (dressing).  Wear loose clothing that does not rub on your rash.  Keep your rash and blisters clean. To do this, wash the area with mild soap and cool water as told by your doctor.  Check your rash every day for signs of  infection. Check for: ? More redness, swelling, or pain. ? Fluid or blood. ? Warmth. ? Pus or a bad smell.  Do not scratch your rash. Do not pick at your blisters. To help you to not scratch: ? Keep your fingernails clean and cut short. ? Wear gloves or mittens when you sleep, if scratching is a problem. General instructions  Rest as told by your doctor.  Keep all follow-up visits as told by your doctor. This is important.  Wash your hands often with soap and water. If soap and water are not available, use hand sanitizer. Doing this lowers your chance of getting a skin infection caused by germs (bacteria).  Your infection can cause chickenpox in people who have never had chickenpox or  never got a shot of chickenpox vaccine. If you have blisters that did not change into scabs yet, try not to touch other people or be around other people, especially: ? Babies. ? Pregnant women. ? Children who have areas of red, itchy, or rough skin (eczema). ? Very old people who have transplants. ? People who have a long-term (chronic) sickness, like cancer or AIDS. Contact a doctor if:  Your pain does not get better with medicine.  Your pain does not get better after the rash heals.  You have any signs of infection in the rash area. These signs include: ? More redness, swelling, or pain around the rash. ? Fluid or blood coming from the rash. ? The rash area feeling warm to the touch. ? Pus or a bad smell coming from the rash. Get help right away if:  The rash is on your face or nose.  You have pain in your face or pain by your eye.  You lose feeling on one side of your face.  You have trouble seeing.  You have ear pain, or you have ringing in your ear.  You have a loss of taste.  Your condition gets worse. Summary  Shingles gives you a painful skin rash and blisters that have fluid in them.  Shingles is an infection. It is caused by the same germ (virus) that causes  chickenpox.  Keep your rash covered with a loose bandage (dressing). Wear loose clothing that does not rub on your rash.  If you have blisters that did not change into scabs yet, try not to touch other people or be around people. This information is not intended to replace advice given to you by your health care provider. Make sure you discuss any questions you have with your health care provider. Document Revised: 07/17/2018 Document Reviewed: 11/27/2016 Elsevier Patient Education  [place]. Valacyclovir caplets What is this medicine? VALACYCLOVIR (val ay SYE kloe veer) is an antiviral medicine. It is used to treat or prevent infections caused by certain kinds of viruses. Examples of these infections include herpes and shingles. This medicine will not cure herpes. This medicine may be used for other purposes; ask your health care provider or pharmacist if you have questions. COMMON BRAND NAME(S): Valtrex What should I tell my health care provider before I take this medicine? They need to know if you have any of these conditions:  acquired immunodeficiency syndrome (AIDS)  any other condition that may weaken the immune system  bone marrow or kidney transplant  kidney disease  an unusual or allergic reaction to valacyclovir, acyclovir, ganciclovir, valganciclovir, other medicines, foods, dyes, or preservatives  pregnant or trying to get pregnant  breast-feeding How should I use this medicine? Take this medicine by mouth with a glass of water. Follow the directions on the prescription label. You can take this medicine with or without food. Take your doses at regular intervals. Do not take your medicine more often than directed. Finish the full course prescribed by your doctor or health care professional even if you think your condition is better. Do not stop taking except on the advice of your doctor or health care professional. Talk to your pediatrician regarding the use of  this medicine in children. While this drug may be prescribed for children as young as 2 years for selected conditions, precautions do apply. Overdosage: If you think you have taken too much of this medicine contact a poison control center or emergency room at once. NOTE:  This medicine is only for you. Do not share this medicine with others. What if I miss a dose? If you miss a dose, take it as soon as you can. If it is almost time for your next dose, take only that dose. Do not take double or extra doses. What may interact with this medicine? Do not take this medicine with any of the following medications:  cidofovir This medicine may also interact with the following medications:  adefovir  amphotericin B  certain antibiotics like amikacin, gentamicin, tobramycin, vancomycin  cimetidine  cisplatin  colistin  cyclosporine  foscarnet  lithium  methotrexate  probenecid  tacrolimus This list may not describe all possible interactions. Give your health care provider a list of all the medicines, herbs, non-prescription drugs, or dietary supplements you use. Also tell them if you smoke, drink alcohol, or use illegal drugs. Some items may interact with your medicine. What should I watch for while using this medicine? Tell your doctor or health care professional if your symptoms do not start to get better after 1 week. This medicine works best when taken early in the course of an infection, within the first 38 hours. Begin treatment as soon as possible after the first signs of infection like tingling, itching, or pain in the affected area. It is possible that genital herpes may still be spread even when you are not having symptoms. Always use safer sex practices like condoms made of latex or polyurethane whenever you have sexual contact. You should stay well hydrated while taking this medicine. Drink plenty of fluids. What side effects may I notice from receiving this medicine? Side  effects that you should report to your doctor or health care professional as soon as possible:  allergic reactions like skin rash, itching or hives, swelling of the face, lips, or tongue  aggressive behavior  confusion  hallucinations  problems with balance, talking, walking  stomach pain  tremor  trouble passing urine or change in the amount of urine Side effects that usually do not require medical attention (report to your doctor or health care professional if they continue or are bothersome):  dizziness  headache  nausea, vomiting This list may not describe all possible side effects. Call your doctor for medical advice about side effects. You may report side effects to FDA at 1-800-FDA-1088. Where should I keep my medicine? Keep out of the reach of children. Store at room temperature between 15 and 25 degrees C (59 and 77 degrees F). Keep container tightly closed. Throw away any unused medicine after the expiration date. NOTE: This sheet is a summary. It may not cover all possible information. If you have questions about this medicine, talk to your doctor, pharmacist, or health care provider.  2020 Elsevier/Gold Standard (2018-04-21 12:22:33)
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
## 2020-03-28 ENCOUNTER — Other Ambulatory Visit: Payer: Self-pay

## 2020-03-28 DIAGNOSIS — R109 Unspecified abdominal pain: Secondary | ICD-10-CM | POA: Insufficient documentation

## 2020-03-28 DIAGNOSIS — R1110 Vomiting, unspecified: Secondary | ICD-10-CM | POA: Diagnosis not present

## 2020-03-28 DIAGNOSIS — F17210 Nicotine dependence, cigarettes, uncomplicated: Secondary | ICD-10-CM | POA: Diagnosis not present

## 2020-03-28 DIAGNOSIS — R112 Nausea with vomiting, unspecified: Secondary | ICD-10-CM | POA: Diagnosis not present

## 2020-03-28 DIAGNOSIS — K429 Umbilical hernia without obstruction or gangrene: Secondary | ICD-10-CM | POA: Insufficient documentation

## 2020-03-28 LAB — URINALYSIS, COMPLETE (UACMP) WITH MICROSCOPIC
Bilirubin Urine: NEGATIVE
Glucose, UA: NEGATIVE mg/dL
Ketones, ur: NEGATIVE mg/dL
Leukocytes,Ua: NEGATIVE
Nitrite: NEGATIVE
Protein, ur: NEGATIVE mg/dL
Specific Gravity, Urine: 1.004 — ABNORMAL LOW (ref 1.005–1.030)
pH: 7 (ref 5.0–8.0)

## 2020-03-28 LAB — COMPREHENSIVE METABOLIC PANEL
ALT: 26 U/L (ref 0–44)
AST: 24 U/L (ref 15–41)
Albumin: 3.8 g/dL (ref 3.5–5.0)
Alkaline Phosphatase: 63 U/L (ref 38–126)
Anion gap: 9 (ref 5–15)
BUN: 10 mg/dL (ref 6–20)
CO2: 29 mmol/L (ref 22–32)
Calcium: 8.6 mg/dL — ABNORMAL LOW (ref 8.9–10.3)
Chloride: 103 mmol/L (ref 98–111)
Creatinine, Ser: 0.75 mg/dL (ref 0.44–1.00)
GFR, Estimated: >60 mL/min (ref >60)
Glucose, Bld: 97 mg/dL (ref 70–99)
Potassium: 3.5 mmol/L (ref 3.5–5.1)
Sodium: 141 mmol/L (ref 135–145)
Total Bilirubin: 0.5 mg/dL (ref 0.3–1.2)
Total Protein: 7.4 g/dL (ref 6.5–8.1)

## 2020-03-28 LAB — LIPASE, BLOOD: Lipase: 18 U/L (ref 11–51)

## 2020-03-28 LAB — CBC
HCT: 39.3 % (ref 36.0–46.0)
Hemoglobin: 12.5 g/dL (ref 12.0–15.0)
MCH: 27.4 pg (ref 26.0–34.0)
MCHC: 31.8 g/dL (ref 30.0–36.0)
MCV: 86 fL (ref 80.0–100.0)
Platelets: 278 10*3/uL (ref 150–400)
RBC: 4.57 MIL/uL (ref 3.87–5.11)
RDW: 14.9 % (ref 11.5–15.5)
WBC: 13 10*3/uL — ABNORMAL HIGH (ref 4.0–10.5)
nRBC: 0 % (ref 0.0–0.2)

## 2020-03-28 LAB — POC URINE PREG, ED: Preg Test, Ur: NEGATIVE

## 2020-03-28 NOTE — ED Triage Notes (Addendum)
Pt here for abdominal pain since Friday. Pt states N/V. Pt unsure what is causing this.

## 2020-03-29 ENCOUNTER — Emergency Department
Admission: EM | Admit: 2020-03-29 | Discharge: 2020-03-29 | Disposition: A | Discharge status: Home / Self Care | Payer: Medicare Other | Attending: Emergency Medicine | Admitting: Emergency Medicine

## 2020-03-29 ENCOUNTER — Emergency Department: Payer: Medicare Other

## 2020-03-29 ENCOUNTER — Encounter: Payer: Self-pay | Admitting: Radiology

## 2020-03-29 DIAGNOSIS — K429 Umbilical hernia without obstruction or gangrene: Secondary | ICD-10-CM

## 2020-03-29 DIAGNOSIS — R1110 Vomiting, unspecified: Secondary | ICD-10-CM | POA: Diagnosis not present

## 2020-03-29 DIAGNOSIS — R109 Unspecified abdominal pain: Secondary | ICD-10-CM

## 2020-03-29 MED ORDER — IOHEXOL 300 MG/ML  SOLN
125.0000 mL | Freq: Once | INTRAMUSCULAR | Status: AC | PRN
  Administered 2020-03-29: 125 mL via INTRAVENOUS
  Filled 2020-03-29: qty 125

## 2020-03-29 NOTE — ED Notes (Signed)
MD at bedside. 

## 2020-03-29 NOTE — ED Notes (Signed)
Pt calling ride to take her home.

## 2020-03-29 NOTE — ED Notes (Signed)
Pt has returned from CT. Pt remains stable. Lights dimmed and call bell in reach.

## 2020-03-29 NOTE — ED Notes (Signed)
PT to CT.

## 2020-03-29 NOTE — ED Notes (Signed)
Pt taking taxi home. Ambulatory and in NAD at time of discharge.
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
## 2020-04-18 ENCOUNTER — Telehealth: Payer: Self-pay | Admitting: Family Medicine

## 2020-04-18 NOTE — Telephone Encounter (Signed)
Copied from [place] [id number]. Topic: Medicare AWV >> Apr 18, 2020 11:41 AM Cher Nakai R wrote: Reason for CRM:   No answer unable to leave message for patient to call back and schedule Medicare Annual Wellness Visit (AWV) in office.   If not able to come in office, please offer to do virtually.   No hx of AWV - AWV-I eligible as of 10/06/2009   Please schedule at anytime with [facility] Health Advisor.   40 minute appointment  Any questions, please contact me at [phone]

## 2020-05-10 ENCOUNTER — Telehealth: Payer: Self-pay

## 2020-05-10 ENCOUNTER — Telehealth (INDEPENDENT_AMBULATORY_CARE_PROVIDER_SITE_OTHER): Payer: Medicare Other | Admitting: Physician Assistant

## 2020-05-10 DIAGNOSIS — R059 Cough, unspecified: Secondary | ICD-10-CM | POA: Diagnosis not present

## 2020-05-10 DIAGNOSIS — R1011 Right upper quadrant pain: Secondary | ICD-10-CM

## 2020-05-10 DIAGNOSIS — K838 Other specified diseases of biliary tract: Secondary | ICD-10-CM

## 2020-05-10 DIAGNOSIS — K429 Umbilical hernia without obstruction or gangrene: Secondary | ICD-10-CM

## 2020-05-10 MED ORDER — PROMETHAZINE HCL 12.5 MG PO TABS: 12.5000 mg | ORAL_TABLET | Freq: Three times a day (TID) | ORAL | 0 refills | Status: DC | PRN

## 2020-05-10 NOTE — Telephone Encounter (Signed)
Copied from [place] [id number]. Topic: General - Other >> May 10, 2020 12:21 PM Alanda Slim E wrote: Reason for CRM: Pt called to schedule an appt for her on going abdominal pain she stated it is on her right side/ Pt also mentioned she had chills last night and a possible fever/ she felt her self but did not have thermometer / please advise if Pt can be seen in office or if the mychart appt she has at 3pm is fine

## 2020-05-10 NOTE — Telephone Encounter (Signed)
Patient had visit on mychart

## 2020-05-10 NOTE — Telephone Encounter (Signed)
Called patient and no answer.Left vm for patient to return call, if patient calls back okay for PEC to advise.

## 2020-05-10 NOTE — Telephone Encounter (Signed)
Can be in person if no respiratory symptoms.

## 2020-05-10 NOTE — Progress Notes (Signed)
MyChart Video Visit    Virtual Visit via Video Note   This visit type was conducted due to national recommendations for restrictions regarding the COVID-19 Pandemic (e.g. social distancing) in an effort to limit this patient's exposure and mitigate transmission in our community. This patient is at least at moderate risk for complications without adequate follow up. This format is felt to be most appropriate for this patient at this time. Physical exam was limited by quality of the video and audio technology used for the visit.   Patient location: Home Provider location: Office   I discussed the limitations of evaluation and management by telemedicine and the availability of in person appointments. The patient expressed understanding and agreed to proceed.  Patient: Suzanne Moses   DOB: Apr 08, 1986   35 y.o. Female  MRN: [id number] Visit Date: 05/10/2020  Today's healthcare provider: Trinna Post, PA-C   Chief Complaint  Patient presents with  . Abdominal Pain   Subjective    Abdominal Pain    Patient has a history of biliary sludge and umbilical hernia presenting today with nausea and vomiting. She has had three days of vomiting most recently, vomiting is most present in the morning. She has had some RUQ pain as well, similar to prior episodes. She thinks this may have been triggered by eating some pork. RUQ ultrasound 12/2019 shows biliary sludge but no definite gallstones. No diarrhea or dysuria. She has had a period in the last 30 days. She was recommended to have her gallbladder out 6 years ago but then got pregnant. She was unable to get surgery at that time. In the interim she has also developed an umbilical hernia. This has enlarged based on her 03/29/2020 CT scan. She is having some cough as well. Has not been tested for COVID since symptoms started.     Medications: Outpatient Medications Prior to Visit  Medication Sig  . ALPRAZolam (XANAX) 0.25 MG tablet Take  0.25 mg by mouth 2 (two) times daily as needed.   Marland Kitchen amphetamine-dextroamphetamine (ADDERALL) 30 MG tablet   . fluticasone (FLONASE) 50 MCG/ACT nasal spray as needed  . ibuprofen (ADVIL,MOTRIN) 800 MG tablet Take 1 tablet (800 mg total) by mouth every 8 (eight) hours as needed.  . predniSONE (STERAPRED UNI-PAK 21 TAB) 10 MG (21) TBPK tablet PO: Take 6 tablets on day 1:Take 5 tablets day 2:Take 4 tablets day 3: Take 3 tablets day 4:Take 2 tablets day five: 5 Take 1 tablet day 6  . valACYclovir (VALTREX) 1000 MG tablet Take 1 tablet (1,000 mg total) by mouth 3 (three) times daily.  . ziprasidone (GEODON) 80 MG capsule Take 80 mg by mouth 2 (two) times daily with a meal.  . [DISCONTINUED] topiramate (TOPAMAX) 50 MG tablet 1/2 tablet every morning for two weeks, then increase to 1 tablet every morning (Patient not taking: Reported on 02/02/2020)  . [DISCONTINUED] triamcinolone cream (KENALOG) 0.5 % as directed (Patient not taking: Reported on 02/02/2020)   No facility-administered medications prior to visit.    Review of Systems  Gastrointestinal: Positive for abdominal pain.      Objective    There were no vitals taken for this visit.   Physical Exam Constitutional:      Appearance: Normal appearance.  Pulmonary:     Effort: Pulmonary effort is normal. No respiratory distress.  Skin:    General: Skin is warm and dry.  Neurological:     Mental Status: She is alert.  Psychiatric:  Mood and Affect: Mood normal.        Behavior: Behavior normal.        Assessment & Plan    1. RUQ pain  Will evaluate as below and r/o cholecystits, refer to general surgery as it sounds like she has needed cholecystectomy for a while. Advised the general surgeon can also touch base on her hernia as well.   - Ambulatory referral to General Surgery - CBC with Differential - Comprehensive Metabolic Panel (CMET) - Lipase - Amylase - US Abdomen Limited RUQ (LIVER/GB); Future  2. Biliary  sludge  - Ambulatory referral to General Surgery - US Abdomen Limited RUQ (LIVER/GB); Future  3. Cough  - COVID-19, Flu A+B and RSV  4. Umbilical hernia without obstruction and without gangrene        I discussed the assessment and treatment plan with the patient. The patient was provided an opportunity to ask questions and all were answered. The patient agreed with the plan and demonstrated an understanding of the instructions.   The patient was advised to call back or seek an in-person evaluation if the symptoms worsen or if the condition fails to improve as anticipated.   ITrinna Post, PA-C, have reviewed all documentation for this visit. The documentation on 05/10/20 for the exam, diagnosis, procedures, and orders are all accurate and complete.  The entirety of the information documented in the History of Present Illness, Review of Systems and Physical Exam were personally obtained by me. Portions of this information were initially documented by [facility] and reviewed by me for thoroughness and accuracy.    Paulene Floor [facility] [phone] (phone) [phone] (fax)  [place]

## 2020-05-18 ENCOUNTER — Ambulatory Visit: Payer: Medicare Other | Admitting: Surgery

## 2020-05-23 ENCOUNTER — Other Ambulatory Visit: Payer: Self-pay

## 2020-05-23 ENCOUNTER — Ambulatory Visit (INDEPENDENT_AMBULATORY_CARE_PROVIDER_SITE_OTHER): Payer: Medicare Other | Admitting: Surgery

## 2020-05-23 ENCOUNTER — Encounter: Payer: Self-pay | Admitting: Surgery

## 2020-05-23 VITALS — BP 131/86 | HR 103 | Temp 98.1°F | Ht 62.0 in | Wt 286.0 lb

## 2020-05-23 DIAGNOSIS — K429 Umbilical hernia without obstruction or gangrene: Secondary | ICD-10-CM | POA: Diagnosis not present

## 2020-05-23 NOTE — Patient Instructions (Addendum)
We recommend losing some weight before having surgery. Try a healthy diet and drink plenty of water. You have been added to the recall list. Someone will call you in July to schedule your next appointment. If you have any concerns or questions, please feel free to call our office.   Calorie Counting for Weight Loss Calories are units of energy. Your body needs a certain number of calories from food to keep going throughout the day. When you eat or drink more calories than your body needs, your body stores the extra calories mostly as fat. When you eat or drink fewer calories than your body needs, your body burns fat to get the energy it needs. Calorie counting means keeping track of how many calories you eat and drink each day. Calorie counting can be helpful if you need to lose weight. If you eat fewer calories than your body needs, you should lose weight. Ask your health care provider what a healthy weight is for you. For calorie counting to work, you will need to eat the right number of calories each day to lose a healthy amount of weight per week. A dietitian can help you figure out how many calories you need in a day and will suggest ways to reach your calorie goal.  A healthy amount of weight to lose each week is usually 1-2 lb (0.5-0.9 kg). This usually means that your daily calorie intake should be reduced by 500-750 calories.  Eating 1,200-1,500 calories a day can help most women lose weight.  Eating 1,500-1,800 calories a day can help most men lose weight. What do I need to know about calorie counting? Work with your health care provider or dietitian to determine how many calories you should get each day. To meet your daily calorie goal, you will need to:  Find out how many calories are in each food that you would like to eat. Try to do this before you eat.  Decide how much of the food you plan to eat.  Keep a food log. Do this by writing down what you ate and how many calories it  had. To successfully lose weight, it is important to balance calorie counting with a healthy lifestyle that includes regular activity. Where do I find calorie information? The number of calories in a food can be found on a Nutrition Facts label. If a food does not have a Nutrition Facts label, try to look up the calories online or ask your dietitian for help. Remember that calories are listed per serving. If you choose to have more than one serving of a food, you will have to multiply the calories per serving by the number of servings you plan to eat. For example, the label on a package of bread might say that a serving size is 1 slice and that there are 90 calories in a serving. If you eat 1 slice, you will have eaten 90 calories. If you eat 2 slices, you will have eaten 180 calories.   How do I keep a food log? After each time that you eat, record the following in your food log as soon as possible:  What you ate. Be sure to include toppings, sauces, and other extras on the food.  How much you ate. This can be measured in cups, ounces, or number of items.  How many calories were in each food and drink.  The total number of calories in the food you ate. Keep your food log near you, such  as in a Chemical engineer or on an app or website on your mobile phone. Some programs will calculate calories for you and show you how many calories you have left to meet your daily goal. What are some portion-control tips?  Know how many calories are in a serving. This will help you know how many servings you can have of a certain food.  Use a measuring cup to measure serving sizes. You could also try weighing out portions on a kitchen scale. With time, you will be able to estimate serving sizes for some foods.  Take time to put servings of different foods on your favorite plates or in your favorite bowls and cups so you know what a serving looks like.  Try not to eat straight from a food's packaging,  such as from a bag or box. Eating straight from the package makes it hard to see how much you are eating and can lead to overeating. Put the amount you would like to eat in a cup or on a plate to make sure you are eating the right portion.  Use smaller plates, glasses, and bowls for smaller portions and to prevent overeating.  Try not to multitask. For example, avoid watching TV or using your computer while eating. If it is time to eat, sit down at a table and enjoy your food. This will help you recognize when you are full. It will also help you be more mindful of what and how much you are eating. What are tips for following this plan? Reading food labels  Check the calorie count compared with the serving size. The serving size may be smaller than what you are used to eating.  Check the source of the calories. Try to choose foods that are high in protein, fiber, and vitamins, and low in saturated fat, trans fat, and sodium. Shopping  Read nutrition labels while you shop. This will help you make healthy decisions about which foods to buy.  Pay attention to nutrition labels for low-fat or fat-free foods. These foods sometimes have the same number of calories or more calories than the full-fat versions. They also often have added sugar, starch, or salt to make up for flavor that was removed with the fat.  Make a grocery list of lower-calorie foods and stick to it. Cooking  Try to cook your favorite foods in a healthier way. For example, try baking instead of frying.  Use low-fat dairy products. Meal planning  Use more fruits and vegetables. One-half of your plate should be fruits and vegetables.  Include lean proteins, such as chicken, [place], and fish. Lifestyle Each week, aim to do one of the following:  150 minutes of moderate exercise, such as walking.  75 minutes of vigorous exercise, such as running. General information  Know how many calories are in the foods you eat most  often. This will help you calculate calorie counts faster.  Find a way of tracking calories that works for you. Get creative. Try different apps or programs if writing down calories does not work for you. What foods should I eat?  Eat nutritious foods. It is better to have a nutritious, high-calorie food, such as an avocado, than a food with few nutrients, such as a bag of potato chips.  Use your calories on foods and drinks that will fill you up and will not leave you hungry soon after eating. ? Examples of foods that fill you up are nuts and nut butters, vegetables, lean  proteins, and high-fiber foods such as whole grains. High-fiber foods are foods with more than 5 g of fiber per serving.  Pay attention to calories in drinks. Low-calorie drinks include water and unsweetened drinks. The items listed above may not be a complete list of foods and beverages you can eat. Contact a dietitian for more information.   What foods should I limit? Limit foods or drinks that are not good sources of vitamins, minerals, or protein or that are high in unhealthy fats. These include:  Candy.  Other sweets.  Sodas, specialty coffee drinks, alcohol, and juice. The items listed above may not be a complete list of foods and beverages you should avoid. Contact a dietitian for more information. How do I count calories when eating out?  Pay attention to portions. Often, portions are much larger when eating out. Try these tips to keep portions smaller: ? Consider sharing a meal instead of getting your own. ? If you get your own meal, eat only half of it. Before you start eating, ask for a container and put half of your meal into it. ? When available, consider ordering smaller portions from the menu instead of full portions.  Pay attention to your food and drink choices. Knowing the way food is cooked and what is included with the meal can help you eat fewer calories. ? If calories are listed on the menu,  choose the lower-calorie options. ? Choose dishes that include vegetables, fruits, whole grains, low-fat dairy products, and lean proteins. ? Choose items that are boiled, broiled, grilled, or steamed. Avoid items that are buttered, battered, fried, or served with cream sauce. Items labeled as crispy are usually fried, unless stated otherwise. ? Choose water, low-fat milk, unsweetened iced tea, or other drinks without added sugar. If you want an alcoholic beverage, choose a lower-calorie option, such as a glass of wine or light beer. ? Ask for dressings, sauces, and syrups on the side. These are usually high in calories, so you should limit the amount you eat. ? If you want a salad, choose a garden salad and ask for grilled meats. Avoid extra toppings such as bacon, cheese, or fried items. Ask for the dressing on the side, or ask for olive oil and vinegar or lemon to use as dressing.  Estimate how many servings of a food you are given. Knowing serving sizes will help you be aware of how much food you are eating at restaurants. Where to find more information  Centers for Disease Control and Prevention: http://www.wolf.info/  U.[place]. Department of Agriculture: http://www.wilson-mendoza.org/ Summary  Calorie counting means keeping track of how many calories you eat and drink each day. If you eat fewer calories than your body needs, you should lose weight.  A healthy amount of weight to lose per week is usually 1-2 lb (0.5-0.9 kg). This usually means reducing your daily calorie intake by 500-750 calories.  The number of calories in a food can be found on a Nutrition Facts label. If a food does not have a Nutrition Facts label, try to look up the calories online or ask your dietitian for help.  Use smaller plates, glasses, and bowls for smaller portions and to prevent overeating.  Use your calories on foods and drinks that will fill you up and not leave you hungry shortly after a meal. This information is not intended to replace  advice given to you by your health care provider. Make sure you discuss any questions you have with your  health care provider. Document Revised: 05/06/2019 Document Reviewed: 05/06/2019 Elsevier Patient Education  2021 [place].     Umbilical Hernia, Adult  A hernia is a bulge of tissue that pushes through an opening between muscles. An umbilical hernia happens in the abdomen, near the belly button (umbilicus). The hernia may contain tissues from the small intestine, large intestine, or fatty tissue covering the intestines (omentum). Umbilical hernias in adults tend to get worse over time, and they require surgical treatment. There are several types of umbilical hernias. You may have:  A hernia located just above or below the umbilicus (indirect hernia). This is the most common type of umbilical hernia in adults.  A hernia that forms through an opening formed by the umbilicus (direct hernia).  A hernia that comes and goes (reducible hernia). A reducible hernia may be visible only when you strain, lift something heavy, or cough. This type of hernia can be pushed back into the abdomen (reduced).  A hernia that traps abdominal tissue inside the hernia (incarcerated hernia). This type of hernia cannot be reduced.  A hernia that cuts off blood flow to the tissues inside the hernia (strangulated hernia). The tissues can start to die if this happens. This type of hernia requires emergency treatment. What are the causes? An umbilical hernia happens when tissue inside the abdomen presses on a weak area of the abdominal muscles. What increases the risk? You may have a greater risk of this condition if you:  Are obese.  Have had several pregnancies.  Have a buildup of fluid inside your abdomen (ascites).  Have had surgery that weakens the abdominal muscles. What are the signs or symptoms? The main symptom of this condition is a painless bulge at or near the belly button. A reducible  hernia may be visible only when you strain, lift something heavy, or cough. Other symptoms may include:  Dull pain.  A feeling of pressure. Symptoms of a strangulated hernia may include:  Pain that gets increasingly worse.  Nausea and vomiting.  Pain when pressing on the hernia.  Skin over the hernia becoming red or purple.  Constipation.  Blood in the stool. How is this diagnosed? This condition may be diagnosed based on:  A physical exam. You may be asked to cough or strain while standing. These actions increase the pressure inside your abdomen and force the hernia through the opening in your muscles. Your health care provider may try to reduce the hernia by pressing on it.  Your symptoms and medical history. How is this treated? Surgery is the only treatment for an umbilical hernia. Surgery for a strangulated hernia is done as soon as possible. If you have a small hernia that is not incarcerated, you may need to lose weight before having surgery. Follow these instructions at home:  Lose weight, if told by your health care provider.  Do not try to push the hernia back in.  Watch your hernia for any changes in color or size. Tell your health care provider if any changes occur.  You may need to avoid activities that increase pressure on your hernia.  Do not lift anything that is heavier than 10 lb (4.5 kg) until your health care provider says that this is safe.  Take over-the-counter and prescription medicines only as told by your health care provider.  Keep all follow-up visits as told by your health care provider. This is important. Contact a health care provider if:  Your hernia gets larger.  Your  hernia becomes painful. Get help right away if:  You develop sudden, severe pain near the area of your hernia.  You have pain as well as nausea or vomiting.  You have pain and the skin over your hernia changes color.  You develop a fever. This information is not  intended to replace advice given to you by your health care provider. Make sure you discuss any questions you have with your health care provider. Document Revised: 05/07/2017 Document Reviewed: 09/23/2016 Elsevier Patient Education  [place].
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
## 2020-09-27 ENCOUNTER — Telehealth: Payer: Self-pay

## 2020-09-27 NOTE — Telephone Encounter (Signed)
Copied from [place] [phone]. Topic: General - Other >> Sep 27, 2020  1:50 PM Celene Kras wrote: Reason for CRM: Pt called to schedule an appt for a hernia she has had for 4-5 years. She states that she believes that it is getting bigger and is requesting to have an appt asap. Was explaining to pt that I could send CRM to see if we could get her in sooner than next available on 10/19/20, but call got disconnected. Attempted to call pt back with no response. No VM set up .Please advise.

## 2020-09-27 NOTE — Telephone Encounter (Signed)
Tried calling; pt's mailbox is full.  PEC please schedule pt the next available.   Thanks,   -Mickel Baas

## 2020-10-31 ENCOUNTER — Ambulatory Visit: Payer: Self-pay

## 2020-10-31 NOTE — Telephone Encounter (Signed)
Pt is calling and would like a callback on [phone]  or cell [phone] . Pt is having right knee pain x 1 wk and trouble walking. Pt has to climb about 30 steps to get to her apartment also having left knee pain  Reason for Disposition  [1] MODERATE pain (e.g., interferes with normal activities, limping) AND [2] present > 3 days  Answer Assessment - Initial Assessment Questions 1. LOCATION and RADIATION: "Where is the pain located?"      Right knee 2. QUALITY: "What does the pain feel like?"  (e.g., sharp, dull, aching, burning)     Ache 3. SEVERITY: "How bad is the pain?" "What does it keep you from doing?"   (Scale 1-10; or mild, moderate, severe)   -  MILD (1-3): doesn't interfere with normal activities    -  MODERATE (4-7): interferes with normal activities (e.g., work or school) or awakens from sleep, limping    -  SEVERE (8-10): excruciating pain, unable to do any normal activities, unable to walk     7 4. ONSET: "When did the pain start?" "Does it come and go, or is it there all the time?"     1 week ago 5. RECURRENT: "Have you had this pain before?" If Yes, ask: "When, and what happened then?"     No 6. SETTING: "Has there been any recent work, exercise or other activity that involved that part of the body?"      Coming down stairs 7. AGGRAVATING FACTORS: "What makes the knee pain worse?" (e.g., walking, climbing stairs, running)     Walking 8. ASSOCIATED SYMPTOMS: "Is there any swelling or redness of the knee?"     No 9. OTHER SYMPTOMS: "Do you have any other symptoms?" (e.g., chest pain, difficulty breathing, fever, calf pain)     No 10. PREGNANCY: "Is there any chance you are pregnant?" "When was your last menstrual period?"       No  Protocols used: Knee Pain-A-AH

## 2020-10-31 NOTE — Telephone Encounter (Signed)
Advised patient that we do not have any appts. She reports that she will go to [facility].

## 2020-10-31 NOTE — Telephone Encounter (Signed)
Pt. Reports she injured her right knee 1 week ago coming down stairs. Knee is painful mainly when ambulating. Has to go up 3 flights of stairs to her apartment. No availability this week in practice. Pt. Given [facility] information and may go there. Would like to be worked in if possible. Please advise. An appointment made in August to discuss weight loss.

## 2020-11-13 ENCOUNTER — Ambulatory Visit: Payer: Self-pay | Admitting: Family Medicine

## 2020-11-13 ENCOUNTER — Telehealth (INDEPENDENT_AMBULATORY_CARE_PROVIDER_SITE_OTHER): Payer: Medicare Other | Admitting: Family Medicine

## 2020-11-13 ENCOUNTER — Other Ambulatory Visit: Payer: Self-pay

## 2020-11-13 DIAGNOSIS — Z713 Dietary counseling and surveillance: Secondary | ICD-10-CM

## 2020-11-13 MED ORDER — PHENTERMINE HCL 15 MG PO CAPS
15.0000 mg | ORAL_CAPSULE | ORAL | 0 refills | Status: DC
Start: 2020-11-13 — End: 2022-01-03

## 2020-11-13 NOTE — Progress Notes (Signed)
      Established patient visit   Patient: Suzanne Moses   DOB: 08-25-1985   35 y.o. Female  MRN: [id number] Visit Date: 11/13/2020  Today's healthcare provider: Lelon Huh, MD   No chief complaint on file.  Subjective    HPI  Exercising every day  4 months lost 17 pounds. Needs to lose 33 more to have repairs of umbilical surgery.  Previously took phentermine which worked very well for her.      Medications: Outpatient Medications Prior to Visit  Medication Sig   ALPRAZolam (XANAX) 0.25 MG tablet Take 0.25 mg by mouth 2 (two) times daily as needed.    amphetamine-dextroamphetamine (ADDERALL) 30 MG tablet    fluticasone (FLONASE) 50 MCG/ACT nasal spray as needed   ibuprofen (ADVIL,MOTRIN) 800 MG tablet Take 1 tablet (800 mg total) by mouth every 8 (eight) hours as needed.   promethazine (PHENERGAN) 12.5 MG tablet Take 1 tablet (12.5 mg total) by mouth every 8 (eight) hours as needed for nausea or vomiting.   ziprasidone (GEODON) 80 MG capsule Take 80 mg by mouth 2 (two) times daily with a meal.   No facility-administered medications prior to visit.    Review of Systems     Objective    There were no vitals taken for this visit.    Physical Exam  Awake, alert, oriented x 3. In no apparent distress   No results found for any visits on 11/13/20.  Assessment & Plan     1. Morbid obesity ([place])  - phentermine 15 MG capsule; Take 1 capsule (15 mg total) by mouth every morning.  Dispense: 30 capsule; Refill: 0   Need in office follow up in 3 weeks. Will need to check labs including thyroid functions and a1c at follow up.         The entirety of the information documented in the History of Present Illness, Review of Systems and Physical Exam were personally obtained by me. Portions of this information were initially documented by the CMA and reviewed by me for thoroughness and accuracy.     Lelon Huh, MD  [facility] [phone]  (phone) [phone] (fax)  [place]

## 2020-12-07 ENCOUNTER — Ambulatory Visit: Payer: Medicare Other | Admitting: Surgery

## 2020-12-12 ENCOUNTER — Ambulatory Visit: Payer: Medicare Other | Admitting: Family Medicine

## 2021-01-02 DIAGNOSIS — Z131 Encounter for screening for diabetes mellitus: Secondary | ICD-10-CM | POA: Diagnosis not present

## 2021-01-02 DIAGNOSIS — Z0000 Encounter for general adult medical examination without abnormal findings: Secondary | ICD-10-CM | POA: Diagnosis not present

## 2021-01-02 DIAGNOSIS — F17200 Nicotine dependence, unspecified, uncomplicated: Secondary | ICD-10-CM | POA: Diagnosis not present

## 2021-01-02 DIAGNOSIS — Z1159 Encounter for screening for other viral diseases: Secondary | ICD-10-CM | POA: Diagnosis not present

## 2021-01-02 DIAGNOSIS — Z13220 Encounter for screening for lipoid disorders: Secondary | ICD-10-CM | POA: Diagnosis not present

## 2021-02-01 ENCOUNTER — Other Ambulatory Visit: Payer: Self-pay | Admitting: Family Medicine

## 2021-02-02 NOTE — Telephone Encounter (Signed)
Requested medications are due for refill today yes  Requested medications are on the active medication list yes  Last refill 8/8  Last visit 11/13/20  Future visit scheduled NO SHOW 12/12/20  Notes to clinic  Not delegated, please assess.  Requested Prescriptions  Pending Prescriptions Disp Refills   phentermine 15 MG capsule [Pharmacy Med Name: phentermine 15 mg capsule] 30 capsule 0    Sig: TAKE ONE CAPSULE BY MOUTH EVERY MORNING     Not Delegated - Gastroenterology:  Antiobesity Agents Failed - 02/01/2021  3:13 PM      Failed - This refill cannot be delegated      Passed - Last BP in normal range    BP Readings from Last 1 Encounters:  05/23/20 131/86          Passed - Last Heart Rate in normal range    Pulse Readings from Last 1 Encounters:  05/23/20 (!) 103          Passed - Valid encounter within last 12 months    Recent Outpatient Visits           2 months ago Morbid obesity [facility])   [place], Kirstie Peri, MD   8 months ago RUQ pain   [facility], Wendee Beavers, [place]   1 year ago Rash   [facility], Kelby Aline, [place]   2 years ago Morbid obesity [facility])   [facility] Birdie Sons, MD   2 years ago Morbid obesity [facility])   [facility] Birdie Sons, MD

## 2021-02-02 NOTE — Telephone Encounter (Signed)
Please advise refill?  LOV: 11/13/2020 Virtual Visit NOV: None Last refill: 11/13/2020 #30 w/0 refills

## 2021-02-16 ENCOUNTER — Telehealth: Payer: Self-pay

## 2021-02-16 NOTE — Telephone Encounter (Signed)
Apt 02/20/2021 at 10:40  Thanks,   -Mickel Baas

## 2021-02-16 NOTE — Telephone Encounter (Signed)
Copied from [place] [phone]. Topic: Appointment Scheduling - Scheduling Inquiry for Clinic >> Feb 16, 2021 11:48 AM Yvette Rack wrote: Reason for CRM: Pt requests sooner appt due to possible hernia and/or growth and pain in her stomach. Cb# [phone]

## 2021-02-19 ENCOUNTER — Telehealth: Payer: Self-pay

## 2021-02-19 NOTE — Telephone Encounter (Signed)
Pt CALLED AFTER HRS, STILL NEEDED TO KNOW ABOUT STATUS OF APPT

## 2021-02-19 NOTE — Telephone Encounter (Signed)
Copied from [place] [id number]. Topic: Appointment Scheduling - Scheduling Inquiry for Clinic >> Feb 19, 2021  2:10 PM Pawlus, Brayton Layman A wrote: Reason for CRM: Pt wanted to know if she could reschedule her appt (11/15) to a different time that same day with Mikey Kirschner, please advise.

## 2021-02-20 ENCOUNTER — Ambulatory Visit: Payer: Medicare Other | Admitting: Physician Assistant

## 2021-02-20 NOTE — Progress Notes (Deleted)
      Established patient visit   Patient: Suzanne Moses   DOB: 08-02-1985   35 y.o. Female  MRN: [id number] Visit Date: 02/20/2021  Today's healthcare provider: Mikey Kirschner, PA-C   No chief complaint on file.  Subjective    HPI  ***  {Link to patient history deactivated due to formatting error:1}  Medications: Outpatient Medications Prior to Visit  Medication Sig   ALPRAZolam (XANAX) 0.25 MG tablet Take 0.25 mg by mouth 2 (two) times daily as needed.    amphetamine-dextroamphetamine (ADDERALL) 30 MG tablet    fluticasone (FLONASE) 50 MCG/ACT nasal spray as needed   ibuprofen (ADVIL,MOTRIN) 800 MG tablet Take 1 tablet (800 mg total) by mouth every 8 (eight) hours as needed.   phentermine 15 MG capsule Take 1 capsule (15 mg total) by mouth every morning.   promethazine (PHENERGAN) 12.5 MG tablet Take 1 tablet (12.5 mg total) by mouth every 8 (eight) hours as needed for nausea or vomiting.   ziprasidone (GEODON) 80 MG capsule Take 80 mg by mouth 2 (two) times daily with a meal.   No facility-administered medications prior to visit.    Review of Systems  Last CBC Lab Results  Component Value Date   WBC 13.0 (H) 03/28/2020   HGB 12.5 03/28/2020   HCT 39.3 03/28/2020   MCV 86.0 03/28/2020   MCH 27.4 03/28/2020   RDW 14.9 03/28/2020   PLT 278 82/50/0370   Last metabolic panel Lab Results  Component Value Date   GLUCOSE 97 03/28/2020   NA 141 03/28/2020   K 3.5 03/28/2020   CL 103 03/28/2020   CO2 29 03/28/2020   BUN 10 03/28/2020   CREATININE 0.75 03/28/2020   GFRNONAA >60 03/28/2020   CALCIUM 8.6 (L) 03/28/2020   PROT 7.4 03/28/2020   ALBUMIN 3.8 03/28/2020   LABGLOB 2.9 05/26/2017   AGRATIO 1.5 05/26/2017   BILITOT 0.5 03/28/2020   ALKPHOS 63 03/28/2020   AST 24 03/28/2020   ALT 26 03/28/2020   ANIONGAP 9 03/28/2020   Last hemoglobin A1c Lab Results  Component Value Date   HGBA1C 5.4 07/29/2015   Last vitamin D Lab Results  Component  Value Date   25OHVITD2 <1.0 05/26/2017   25OHVITD3 23 05/26/2017   Last vitamin B12 and Folate Lab Results  Component Value Date   VITAMINB12 373 05/26/2017       Objective    There were no vitals taken for this visit. BP Readings from Last 3 Encounters:  05/23/20 131/86  03/29/20 130/82  12/20/19 (!) 135/101   Wt Readings from Last 3 Encounters:  05/23/20 286 lb (129.7 kg)  12/20/19 (!) 311 lb (141.1 kg)  09/07/18 280 lb (127 kg)      Physical Exam  ***  No results found for any visits on 02/20/21.  Assessment & Plan     ***  No follow-ups on file.      {provider attestation***:1}   Mikey Kirschner, PA-C  [facility] [phone] (phone) [phone] (fax)  [place]

## 2021-02-22 ENCOUNTER — Other Ambulatory Visit: Payer: Self-pay

## 2021-02-22 ENCOUNTER — Ambulatory Visit (INDEPENDENT_AMBULATORY_CARE_PROVIDER_SITE_OTHER): Payer: Medicare Other | Admitting: Physician Assistant

## 2021-02-22 ENCOUNTER — Encounter: Payer: Self-pay | Admitting: Physician Assistant

## 2021-02-22 VITALS — BP 140/84 | HR 77 | Temp 98.1°F | Resp 16 | Wt 271.1 lb

## 2021-02-22 DIAGNOSIS — K429 Umbilical hernia without obstruction or gangrene: Secondary | ICD-10-CM | POA: Diagnosis not present

## 2021-02-22 DIAGNOSIS — Z23 Encounter for immunization: Secondary | ICD-10-CM

## 2021-02-22 NOTE — Progress Notes (Signed)
      Established patient visit   Patient: Suzanne Moses   DOB: 1985/11/09   35 y.o. Female  MRN: [id number] Visit Date: 02/22/2021  Today's healthcare provider: Mikey Kirschner, PA-C   Cc. Hernia changes  Subjective    HPI  Suzanne " Benjamine Moses" is a 35 year old female who presents today with concerns that her umbilical hernia is growing.  Reports that it is not painful or uncomfortable but she always notices it and it has been growing so she is concerned to something other than a hernia.  Denies any changes in her bowel movements, denies any nausea vomiting, increasing GERD.  Medications: Outpatient Medications Prior to Visit  Medication Sig   ALPRAZolam (XANAX) 0.25 MG tablet Take 0.25 mg by mouth 2 (two) times daily as needed.    amphetamine-dextroamphetamine (ADDERALL) 30 MG tablet    fluticasone (FLONASE) 50 MCG/ACT nasal spray as needed   ibuprofen (ADVIL,MOTRIN) 800 MG tablet Take 1 tablet (800 mg total) by mouth every 8 (eight) hours as needed.   promethazine (PHENERGAN) 12.5 MG tablet Take 1 tablet (12.5 mg total) by mouth every 8 (eight) hours as needed for nausea or vomiting.   ziprasidone (GEODON) 80 MG capsule Take 80 mg by mouth 2 (two) times daily with a meal.   phentermine 15 MG capsule Take 1 capsule (15 mg total) by mouth every morning. (Patient not taking: Reported on 02/22/2021)   No facility-administered medications prior to visit.    Review of Systems  Constitutional:  Negative for fatigue and fever.  Gastrointestinal:  Positive for abdominal distention. Negative for abdominal pain, blood in stool, constipation, diarrhea and nausea.  All other systems reviewed and are negative.     Objective    BP 140/84 (BP Location: Left Arm, Patient Position: Sitting, Cuff Size: Large)   Pulse 77   Temp 98.1 F (36.7 C) (Oral)   Resp 16   Wt 271 lb 1.6 oz (123 kg)   BMI 49.58 kg/m    Physical Exam Constitutional:      Appearance: Normal appearance. She is  obese. She is not ill-appearing.  Pulmonary:     Effort: Pulmonary effort is normal.  Abdominal:     Palpations: Abdomen is soft.     Tenderness: There is no abdominal tenderness. There is no rebound.     Hernia: A hernia is present. Hernia is present in the umbilical area.     Comments: Non reducible umbilical hernia, non tender.  Neurological:     Mental Status: She is alert.     No results found for any visits on 02/22/21.  Assessment & Plan     Non reducible umbilical hernia Referral to gen surg. Pt states they asked her to lose 60 pounds before having surgery in the past. Pt  was 311 lb 9/21 today is 271 lb. She is making changes to lose weight but it is slow. Encouraged her to vocalize this at her appointment and stress that the hernia is growing and is more bothersome.  Reassured does not appear to be anything more than a hernia, CT 12/21 only showed hernia containing fat.  Return if symptoms worsen or fail to improve.      I, Mikey Kirschner, PA-C have reviewed all documentation for this visit. The documentation on  02/22/2021 for the exam, diagnosis, procedures, and orders are all accurate and complete.    Mikey Kirschner, PA-C  [facility] [phone] (phone) [phone] (fax)  [place]

## 2021-02-27 ENCOUNTER — Ambulatory Visit: Payer: Medicare Other | Admitting: Surgery

## 2021-03-08 ENCOUNTER — Ambulatory Visit: Payer: Medicare Other | Admitting: Surgery

## 2021-03-15 ENCOUNTER — Telehealth: Payer: Self-pay

## 2021-03-15 NOTE — Telephone Encounter (Signed)
This message was for her mom. Patient and her mom have the same name.

## 2021-03-15 NOTE — Telephone Encounter (Signed)
Copied from [place] [phone]. Topic: Quick Communication - Rx Refill/Question >> Mar 15, 2021  3:02 PM Pawlus, Brayton Layman A wrote: Pt called in to see if Dr Caryn Section could provide any Insulin samples ,pt stated they cannot afford the current cost of her medications, please advise.

## 2021-04-04 ENCOUNTER — Telehealth: Payer: Self-pay

## 2021-04-04 NOTE — Telephone Encounter (Signed)
Copied from [place] [phone]. Topic: Quick Communication - Rx Refill/Question >> Apr 04, 2021  1:22 PM Erick Blinks wrote: Pt called to report that the patient needs samples, they cannot afford her insulin this week. Seeking samples from the office.   [phone]

## 2021-04-05 NOTE — Telephone Encounter (Signed)
PEC sent this message in the wrong chart.

## 2021-04-08 NOTE — L&D Delivery Note (Signed)
Delivery Note At 5:23 PM a  floppy   female was delivered via Vaginal, Spontaneous (Presentation: LOA     ).   Severe shoulder dystocia . Baby on perineum for 3 minutes when I got called to come to L+D immediately for dystocia . When I entered the room the patient was in knee chest position  with CNM Wilson unable to deliver the shoulders. I took over and flipped pt back into supine position performed McRoberts. The head was LOA 30 degree of midline and still anterior shoulder would not deliver . I was able to then deliver the posterior shoulder with an audible " pop". A floppy baby's cord was promptly cut and passes to awaiting nursery staff who perform PPV .    APGAR: 0/8, ; weight  #8/6.  Time from my entry into room < 1 minute for delivery . Total head on perineum 6 minutes . Nursery staff aware on left arm pop .  Placenta status:  Intact delivered by CNM . Second degree repair by CNM ,  .  Cord:   with the following complications: Severe shoulder dystocia  .  Cord pH: arterial : 7.3/53/26   Suzanne Moses 01/01/2022, 5:51 PM

## 2021-05-22 ENCOUNTER — Other Ambulatory Visit: Payer: Self-pay

## 2021-05-22 ENCOUNTER — Ambulatory Visit (LOCAL_COMMUNITY_HEALTH_CENTER): Payer: Self-pay

## 2021-05-22 VITALS — BP 137/90 | Ht 62.0 in | Wt 280.0 lb

## 2021-05-22 DIAGNOSIS — Z3201 Encounter for pregnancy test, result positive: Secondary | ICD-10-CM

## 2021-05-22 LAB — PREGNANCY, URINE: Preg Test, Ur: POSITIVE — AB

## 2021-05-22 MED ORDER — PRENATAL 27-0.8 MG PO TABS
1.0000 | ORAL_TABLET | Freq: Every day | ORAL | 0 refills | Status: AC
Start: 2021-05-22 — End: 2021-08-30

## 2021-05-22 NOTE — Progress Notes (Addendum)
UPT positive. Unsure where she plans prenatal care, but considering [facility].Taking med : Geodon prescribed by Dr Wynetta Emery, [facility]. Stopped taking Xanax and Adderall 2 days ago. Has appt with Dr Wynetta Emery 05/30/2021.   Consult E Sciora, CNM who recommends RN to counsel pt to contact her prescribing provider regarding any adjustments/ alternative options with meds while pregnant. Advises pt to establish prenatal care soon.   RN counseled pt on provider recommendations. Pt in agreement and plans to contact provider today.  Pt sent to DSS for medicaid/preg women. Josie Saunders, RN Consulted on the plan of care for this client.  I agree with the documented note and actions taken to provide care for this client.  Ola Spurr, CNM

## 2021-07-04 DIAGNOSIS — F1190 Opioid use, unspecified, uncomplicated: Secondary | ICD-10-CM | POA: Insufficient documentation

## 2021-07-09 DIAGNOSIS — Z1321 Encounter for screening for nutritional disorder: Secondary | ICD-10-CM | POA: Diagnosis not present

## 2021-07-09 DIAGNOSIS — Z1159 Encounter for screening for other viral diseases: Secondary | ICD-10-CM | POA: Diagnosis not present

## 2021-07-09 DIAGNOSIS — Z79891 Long term (current) use of opiate analgesic: Secondary | ICD-10-CM | POA: Diagnosis not present

## 2021-07-09 DIAGNOSIS — Z7182 Exercise counseling: Secondary | ICD-10-CM | POA: Diagnosis not present

## 2021-07-09 DIAGNOSIS — E559 Vitamin D deficiency, unspecified: Secondary | ICD-10-CM | POA: Diagnosis not present

## 2021-07-09 DIAGNOSIS — Z7289 Other problems related to lifestyle: Secondary | ICD-10-CM | POA: Diagnosis not present

## 2021-07-09 DIAGNOSIS — Z131 Encounter for screening for diabetes mellitus: Secondary | ICD-10-CM | POA: Diagnosis not present

## 2021-07-09 DIAGNOSIS — Z0189 Encounter for other specified special examinations: Secondary | ICD-10-CM | POA: Diagnosis not present

## 2021-07-09 LAB — OB RESULTS CONSOLE GC/CHLAMYDIA
Chlamydia: NEGATIVE
Neisseria Gonorrhea: NEGATIVE

## 2021-07-09 LAB — OB RESULTS CONSOLE HIV ANTIBODY (ROUTINE TESTING): HIV: NONREACTIVE

## 2021-07-09 LAB — OB RESULTS CONSOLE RPR: RPR: NONREACTIVE

## 2021-07-09 LAB — OB RESULTS CONSOLE RUBELLA ANTIBODY, IGM: Rubella: IMMUNE

## 2021-07-09 LAB — OB RESULTS CONSOLE HEPATITIS B SURFACE ANTIGEN: Hepatitis B Surface Ag: NEGATIVE

## 2021-07-09 LAB — OB RESULTS CONSOLE VARICELLA ZOSTER ANTIBODY, IGG: Varicella: IMMUNE

## 2021-07-10 ENCOUNTER — Encounter: Payer: Medicare Other | Admitting: Obstetrics and Gynecology

## 2021-07-10 DIAGNOSIS — Z3A11 11 weeks gestation of pregnancy: Secondary | ICD-10-CM

## 2021-07-10 DIAGNOSIS — Z7689 Persons encountering health services in other specified circumstances: Secondary | ICD-10-CM

## 2021-07-10 DIAGNOSIS — Z3481 Encounter for supervision of other normal pregnancy, first trimester: Secondary | ICD-10-CM

## 2021-09-04 DIAGNOSIS — K429 Umbilical hernia without obstruction or gangrene: Secondary | ICD-10-CM | POA: Diagnosis not present

## 2021-09-10 ENCOUNTER — Encounter: Payer: Self-pay | Admitting: Obstetrics and Gynecology

## 2021-09-10 ENCOUNTER — Observation Stay
Admission: EM | Admit: 2021-09-10 | Discharge: 2021-09-10 | Discharge status: Against Medical Advice | Payer: Medicare Other | Attending: Certified Nurse Midwife | Admitting: Certified Nurse Midwife

## 2021-09-10 ENCOUNTER — Other Ambulatory Visit: Payer: Self-pay

## 2021-09-10 DIAGNOSIS — O99330 Smoking (tobacco) complicating pregnancy, unspecified trimester: Secondary | ICD-10-CM | POA: Insufficient documentation

## 2021-09-10 DIAGNOSIS — O26892 Other specified pregnancy related conditions, second trimester: Principal | ICD-10-CM | POA: Insufficient documentation

## 2021-09-10 DIAGNOSIS — Z3A20 20 weeks gestation of pregnancy: Secondary | ICD-10-CM | POA: Insufficient documentation

## 2021-09-10 DIAGNOSIS — Z5321 Procedure and treatment not carried out due to patient leaving prior to being seen by health care provider: Secondary | ICD-10-CM | POA: Diagnosis not present

## 2021-09-10 DIAGNOSIS — F17200 Nicotine dependence, unspecified, uncomplicated: Secondary | ICD-10-CM | POA: Insufficient documentation

## 2021-09-10 DIAGNOSIS — R109 Unspecified abdominal pain: Secondary | ICD-10-CM | POA: Diagnosis present

## 2021-09-10 LAB — URINALYSIS, COMPLETE (UACMP) WITH MICROSCOPIC
Bilirubin Urine: NEGATIVE
Glucose, UA: NEGATIVE mg/dL
Hgb urine dipstick: NEGATIVE
Ketones, ur: NEGATIVE mg/dL
Leukocytes,Ua: NEGATIVE
Nitrite: NEGATIVE
Protein, ur: NEGATIVE mg/dL
Specific Gravity, Urine: 1.019 (ref 1.005–1.030)
pH: 7 (ref 5.0–8.0)

## 2021-09-10 LAB — COMPREHENSIVE METABOLIC PANEL
ALT: 13 U/L (ref 0–44)
AST: 11 U/L — ABNORMAL LOW (ref 15–41)
Albumin: 3 g/dL — ABNORMAL LOW (ref 3.5–5.0)
Alkaline Phosphatase: 73 U/L (ref 38–126)
Anion gap: 5 (ref 5–15)
BUN: 7 mg/dL (ref 6–20)
CO2: 27 mmol/L (ref 22–32)
Calcium: 8.8 mg/dL — ABNORMAL LOW (ref 8.9–10.3)
Chloride: 106 mmol/L (ref 98–111)
Creatinine, Ser: 0.4 mg/dL — ABNORMAL LOW (ref 0.44–1.00)
GFR, Estimated: >60 mL/min (ref >60)
Glucose, Bld: 88 mg/dL (ref 70–99)
Potassium: 4.7 mmol/L (ref 3.5–5.1)
Sodium: 138 mmol/L (ref 135–145)
Total Bilirubin: 0.2 mg/dL — ABNORMAL LOW (ref 0.3–1.2)
Total Protein: 6.9 g/dL (ref 6.5–8.1)

## 2021-09-10 LAB — WET PREP, GENITAL
Clue Cells Wet Prep HPF POC: NONE SEEN
Sperm: NONE SEEN
Trich, Wet Prep: NONE SEEN
WBC, Wet Prep HPF POC: <10 (ref <10)
Yeast Wet Prep HPF POC: NONE SEEN

## 2021-09-10 LAB — CBC
HCT: 35.1 % — ABNORMAL LOW (ref 36.0–46.0)
Hemoglobin: 11.5 g/dL — ABNORMAL LOW (ref 12.0–15.0)
MCH: 28 pg (ref 26.0–34.0)
MCHC: 32.8 g/dL (ref 30.0–36.0)
MCV: 85.6 fL (ref 80.0–100.0)
Platelets: 263 10*3/uL (ref 150–400)
RBC: 4.1 MIL/uL (ref 3.87–5.11)
RDW: 14.5 % (ref 11.5–15.5)
WBC: 13.4 10*3/uL — ABNORMAL HIGH (ref 4.0–10.5)
nRBC: 0 % (ref 0.0–0.2)

## 2021-09-10 LAB — CHLAMYDIA/NGC RT PCR (ARMC ONLY)
Chlamydia Tr: NOT DETECTED
N gonorrhoeae: NOT DETECTED

## 2021-09-10 MED ORDER — ACETAMINOPHEN 325 MG PO TABS: 650.0000 mg | ORAL_TABLET | ORAL | Status: DC | PRN

## 2021-09-10 NOTE — OB Triage Note (Addendum)
Pt presents to L&D for constant right sided abdominal pain. Pt first noticed pain 4 days ago and went to her OB who suggested a menthol patch thinking it may be muscular. Pt presents today with pain worsening and unrelieved by tylenol. Pt denies bleeding or LOF. Pt reports positive fetal movement. Pt reports having a hernia and was unsure if that was the causing factor of the pain. Pt reports the pain is worse if she coughs or strains. Fetal heart tone obtained with doppler was 155 BPM.  VSS, Will continue to monitor.

## 2021-09-10 NOTE — Progress Notes (Signed)
Provider went to bedside to go over results from the patient's labs that were sent down. The pt and her significant other were not in the room and the hospital gown was on the bed. RN attempted to call all 3 numbers on file for patient with no answer. Pt discharged AMA.

## 2021-09-10 NOTE — Discharge Summary (Signed)
Patient ID: Suzanne Moses MRN: [id number] DOB/AGE: 1986/02/01 36 y.o.  Admit date: 09/10/2021 Discharge date: 09/10/2021  Admission Diagnoses: 36yo G3P2 at 54w2dpresents with c/o constant right sided abdominal pain. This pain started 4 days ago and was addressed by her OB provider however the pain became worse and constant today.  Discharge Diagnoses: Pt left AMA without being seen by provider  Factors complicating pregnancy: Obesity Bipolar Smoker H/o of opioid use disorder H/o PTD Umbilical hernia Vitamin D deficiency    Prenatal Procedures: none  Consults: None  Significant Diagnostic Studies:  Results for orders placed or performed during the hospital encounter of 09/10/21 (from the past 168 hour(s))  Wet prep, genital   Collection Time: 09/10/21 11:59 AM   Specimen: Urine, Clean Catch  Result Value Ref Range   Yeast Wet Prep HPF POC NONE SEEN NONE SEEN   Trich, Wet Prep NONE SEEN NONE SEEN   Clue Cells Wet Prep HPF POC NONE SEEN NONE SEEN   WBC, Wet Prep HPF POC <10 <10   Sperm NONE SEEN   Chlamydia/NGC rt PCR ([facility] only)   Collection Time: 09/10/21 11:59 AM   Specimen: Urine, Clean Catch  Result Value Ref Range   Specimen source GC/Chlam ENDOCERVICAL    Chlamydia Tr NOT DETECTED NOT DETECTED   N gonorrhoeae NOT DETECTED NOT DETECTED  Urinalysis, Complete w Microscopic Urine, Clean Catch   Collection Time: 09/10/21 11:59 AM  Result Value Ref Range   Color, Urine YELLOW (A) YELLOW   APPearance HAZY (A) CLEAR   Specific Gravity, Urine 1.019 1.005 - 1.030   pH 7.0 5.0 - 8.0   Glucose, UA NEGATIVE NEGATIVE mg/dL   Hgb urine dipstick NEGATIVE NEGATIVE   Bilirubin Urine NEGATIVE NEGATIVE   Ketones, ur NEGATIVE NEGATIVE mg/dL   Protein, ur NEGATIVE NEGATIVE mg/dL   Nitrite NEGATIVE NEGATIVE   Leukocytes,Ua NEGATIVE NEGATIVE   RBC / HPF 0-5 0 - 5 RBC/hpf   WBC, UA 0-5 0 - 5 WBC/hpf   Bacteria, UA FEW (A) NONE SEEN   Squamous Epithelial / LPF 0-5 0 - 5    Mucus PRESENT   CBC   Collection Time: 09/10/21 12:03 PM  Result Value Ref Range   WBC 13.4 (H) 4.0 - 10.5 K/uL   RBC 4.10 3.87 - 5.11 MIL/uL   Hemoglobin 11.5 (L) 12.0 - 15.0 g/dL   HCT 35.1 (L) 36.0 - 46.0 %   MCV 85.6 80.0 - 100.0 fL   MCH 28.0 26.0 - 34.0 pg   MCHC 32.8 30.0 - 36.0 g/dL   RDW 14.5 11.5 - 15.5 %   Platelets 263 150 - 400 K/uL   nRBC 0.0 0.0 - 0.2 %  Comprehensive metabolic panel   Collection Time: 09/10/21 12:03 PM  Result Value Ref Range   Sodium 138 135 - 145 mmol/L   Potassium 4.7 3.5 - 5.1 mmol/L   Chloride 106 98 - 111 mmol/L   CO2 27 22 - 32 mmol/L   Glucose, Bld 88 70 - 99 mg/dL   BUN 7 6 - 20 mg/dL   Creatinine, Ser 0.40 (L) 0.44 - 1.00 mg/dL   Calcium 8.8 (L) 8.9 - 10.3 mg/dL   Total Protein 6.9 6.5 - 8.1 g/dL   Albumin 3.0 (L) 3.5 - 5.0 g/dL   AST 11 (L) 15 - 41 U/L   ALT 13 0 - 44 U/L   Alkaline Phosphatase 73 38 - 126 U/L   Total Bilirubin 0.2 (L) 0.3 -  1.2 mg/dL   GFR, Estimated >60 >60 mL/min   Anion gap 5 5 - 15    Treatments: none  Hospital Course:  This is a 36 y.o. [id number] with IUP at 51w2dpresents for c/o constant right sided abdominal pain.  Labs were collected prior to patient eloping and leaving AMA before being seen by the provider. Lab results were WNL and heart tones were reassuring. Pt left AMA.   Discharge Physical Exam:  BP 126/62 (BP Location: Right Arm)   Pulse 89   Temp 98 F (36.7 C) (Oral)   Resp 16   Ht '5\' 3"'$  (1.6 m)   Wt 131.1 kg   LMP 04/21/2021   SpO2 99%   BMI 51.19 kg/m       Discharge Condition: Stable  Disposition:    Allergies as of 09/10/2021       Reactions   Zofran [ondansetron Hcl] Hives        Medication List     TAKE these medications    acetaminophen 500 MG tablet Commonly known as: TYLENOL Take 500 mg by mouth every 6 (six) hours as needed.   ALPRAZolam 0.25 MG tablet Commonly known as: XANAX Take 0.25 mg by mouth 2 (two) times daily as needed.    amphetamine-dextroamphetamine 30 MG tablet Commonly known as: ADDERALL   aspirin EC 81 MG tablet Take 81 mg by mouth daily. Swallow whole.   fluticasone 50 MCG/ACT nasal spray Commonly known as: FLONASE as needed   ibuprofen 800 MG tablet Commonly known as: ADVIL Take 1 tablet (800 mg total) by mouth every 8 (eight) hours as needed.   multivitamin-prenatal 27-0.8 MG Tabs tablet Take 1 tablet by mouth daily at 12 noon.   phentermine 15 MG capsule Take 1 capsule (15 mg total) by mouth every morning.   promethazine 12.5 MG tablet Commonly known as: PHENERGAN Take 1 tablet (12.5 mg total) by mouth every 8 (eight) hours as needed for nausea or vomiting.   ziprasidone 80 MG capsule Commonly known as: GEODON Take 80 mg by mouth 2 (two) times daily with a meal.         Signed:  JLinda Hedges CNM 09/10/2021 5:15 PM

## 2021-10-15 DIAGNOSIS — O35EXX0 Maternal care for other (suspected) fetal abnormality and damage, fetal genitourinary anomalies, not applicable or unspecified: Secondary | ICD-10-CM | POA: Diagnosis not present

## 2021-11-27 DIAGNOSIS — Z1389 Encounter for screening for other disorder: Secondary | ICD-10-CM | POA: Diagnosis not present

## 2021-11-27 DIAGNOSIS — Z131 Encounter for screening for diabetes mellitus: Secondary | ICD-10-CM | POA: Diagnosis not present

## 2021-11-27 DIAGNOSIS — Z23 Encounter for immunization: Secondary | ICD-10-CM | POA: Diagnosis not present

## 2021-12-08 ENCOUNTER — Observation Stay
Admission: EM | Admit: 2021-12-08 | Discharge: 2021-12-09 | Disposition: A | Discharge status: Home / Self Care | Payer: Medicare Other | Attending: Certified Nurse Midwife | Admitting: Certified Nurse Midwife

## 2021-12-08 DIAGNOSIS — R112 Nausea with vomiting, unspecified: Secondary | ICD-10-CM | POA: Diagnosis present

## 2021-12-08 DIAGNOSIS — O24419 Gestational diabetes mellitus in pregnancy, unspecified control: Secondary | ICD-10-CM | POA: Diagnosis not present

## 2021-12-08 DIAGNOSIS — O212 Late vomiting of pregnancy: Secondary | ICD-10-CM | POA: Diagnosis present

## 2021-12-08 DIAGNOSIS — Z79899 Other long term (current) drug therapy: Secondary | ICD-10-CM | POA: Insufficient documentation

## 2021-12-08 DIAGNOSIS — O0993 Supervision of high risk pregnancy, unspecified, third trimester: Principal | ICD-10-CM | POA: Insufficient documentation

## 2021-12-08 DIAGNOSIS — O99333 Smoking (tobacco) complicating pregnancy, third trimester: Secondary | ICD-10-CM | POA: Diagnosis not present

## 2021-12-08 DIAGNOSIS — Z7982 Long term (current) use of aspirin: Secondary | ICD-10-CM | POA: Diagnosis not present

## 2021-12-08 DIAGNOSIS — F17210 Nicotine dependence, cigarettes, uncomplicated: Secondary | ICD-10-CM | POA: Insufficient documentation

## 2021-12-08 DIAGNOSIS — Z3A33 33 weeks gestation of pregnancy: Secondary | ICD-10-CM | POA: Diagnosis not present

## 2021-12-08 DIAGNOSIS — O4703 False labor before 37 completed weeks of gestation, third trimester: Secondary | ICD-10-CM | POA: Diagnosis not present

## 2021-12-08 MED ORDER — ACETAMINOPHEN 325 MG PO TABS: 650.0000 mg | ORAL_TABLET | ORAL | Status: DC | PRN

## 2021-12-08 MED ORDER — PROMETHAZINE HCL 25 MG/ML IJ SOLN
25.0000 mg | Freq: Once | INTRAMUSCULAR | Status: AC
Start: 2021-12-08 — End: 2021-12-08
  Administered 2021-12-08: 25 mg via INTRAVENOUS
  Filled 2021-12-08: qty 1

## 2021-12-08 MED ORDER — LACTATED RINGERS IV BOLUS
1000.0000 mL | Freq: Once | INTRAVENOUS | Status: AC
Start: 2021-12-08 — End: 2021-12-08
  Administered 2021-12-08: 1000 mL via INTRAVENOUS

## 2021-12-08 MED ORDER — CALCIUM CARBONATE ANTACID 500 MG PO CHEW: 2.0000 | CHEWABLE_TABLET | ORAL | Status: DC | PRN

## 2021-12-08 NOTE — Discharge Summary (Signed)
Suzanne Moses is a 36 y.o. female. She is at 21w0dgestation. Patient's last menstrual period was 04/21/2021. Estimated Date of Delivery: 01/26/22  [facility] Current [facility] complicated by:  - Obesity - Bipolar - History of preterm delivery - Opioid use disorder, taking Suboxone - Umbilical hernia - GDM  Chief complaint: Nausea and vomiting since 0500. She reports uterine contractions as well after she began vomiting. Good fetal movement.  S: Resting comfortably. no CTX, no VB.no LOF,  Active fetal movement.  Denies: HA, visual changes, SOB, or RUQ/epigastric pain Pt is periodically vomiting  Maternal Medical History:   Past Medical History:  Diagnosis Date   Abdominal pain affecting pregnancy 07/29/2015   Anxiety    B[place] disorder ([place]    Hematoma 10/19/2011   Labor and delivery, indication for care 09/19/2015   Nausea and vomiting of pregnancy, antepartum 082/95/6213  Obesity complicating pregnancy, third trimester (CODE) 09/19/2015   Patient non-compliant, refused intervention or support    history of leaving AMA, 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   Polysubstance abuse ([place]    cocaine, marijuana, methadone, benzos   Pregnancy 008/65/7846  Umbilical hernia     Past Surgical History:  Procedure Laterality Date   OVARY SURGERY     THROAT SURGERY  2015   Remove a polyp   TONSILLECTOMY     UNILATERAL SALPINGECTOMY Left 2013    Allergies  Allergen Reactions   Zofran [Ondansetron Hcl] Hives    Prior to Admission medications   Medication Sig Start Date End Date Taking? Authorizing Provider  acetaminophen (TYLENOL) 500 MG tablet Take 500 mg by mouth every 6 (six) hours as needed.    [provider]  ALPRAZolam (Duanne Moron 0.25 MG tablet Take 0.25 mg by mouth 2 (two) times daily as needed. 11/30/12   [provider]  amphetamine-dextroamphetamine (ADDERALL) 30 MG tablet  08/20/16   [provider]  aspirin EC 81 MG tablet Take  81 mg by mouth daily. Swallow whole.    [provider]  fluticasone (Asencion Islam 50 MCG/ACT nasal spray as needed Patient not taking: Reported on 05/22/2021 04/15/17   [provider]  ibuprofen (ADVIL,MOTRIN) 800 MG tablet Take 1 tablet (800 mg total) by mouth every 8 (eight) hours as needed. Patient not taking: Reported on 05/22/2021 12/31/16   BGregor Hams MD  phentermine 15 MG capsule Take 1 capsule (15 mg total) by mouth every morning. Patient not taking: Reported on 02/22/2021 11/13/20   FBirdie Sons MD  Prenatal Vit-Fe Fumarate-FA (MULTIVITAMIN-PRENATAL) 27-0.8 MG TABS tablet Take 1 tablet by mouth daily at 12 noon.    [provider]  promethazine (PHENERGAN) 12.5 MG tablet Take 1 tablet (12.5 mg total) by mouth every 8 (eight) hours as needed for nausea or vomiting. Patient not taking: Reported on 05/22/2021 05/10/20   PTrinna Post PA-C  ziprasidone (GEODON) 80 MG capsule Take 80 mg by mouth 2 (two) times daily with a meal.    [provider]      Social History: She  reports that she has been smoking cigarettes. She has been smoking an average of .5 packs per day. She has never used smokeless tobacco. She reports that she does not currently use drugs. She reports that she does not drink alcohol.  Family History: family history includes Cervical cancer in her maternal grandmother; Diabetes Mellitus I in her father and mother; Hypertension in her father; Lung cancer in her maternal aunt and maternal uncle.  no history of gyn cancers  Review of Systems: A full review of systems was performed and negative except as noted in the HPI.     O:  BP 131/63 (BP Location: Left Arm)   Pulse (!) 111   Temp 98.5 F (36.9 C) (Oral)   Resp 18   LMP 04/21/2021  No results found for this or any previous visit (from the past 48 hour(s)).   Constitutional: NAD, AAOx3  HE/ENT: extraocular movements grossly intact, moist mucous membranes CV: RRR PULM: nl  respiratory effort, CTABL     Abd: gravid, non-tender, non-distended, soft      Ext: Non-tender, Nonedematous   Psych: mood appropriate, speech normal Pelvic: deferred  Fetal  monitoring: Cat 1 Appropriate for GA Baseline: 145bpm Variability: moderate Accelerations: present x >2 Decelerations absent Contractions: rare  A/P: 36 y.o. 73w0dhere for antenatal surveillance for nausea and vomiting in pregnancy  Principle Diagnosis:  High risk pregnancy in third trimester with nausea and vomiting  Labor: not present.  Fetal Wellbeing: Reassuring Cat 1 tracing. Reactive NST  1L LR IV fluids, '25mg'$  Phenergan IV improved her nausea and vomiting D/c home stable, precautions reviewed, follow-up as scheduled.    DGertie Fey CNM 12/09/2021 11:59 PM

## 2021-12-08 NOTE — Discharge Summary (Incomplete)
Suzanne Moses is a 36 y.o. female. She is at 70w0dgestation. Patient's last menstrual period was 04/21/2021. Estimated Date of Delivery: 01/26/22  [facility] Current [facility] complicated by:  - Obesity - Bipolar - History of preterm delivery - Opioid use disorder, taking Suboxone - Umbilical hernia - GDM  Chief complaint: Nausea and vomiting since 0500. She reports uterine contractions as well after she began vomiting. Good fetal movement.  S: Resting comfortably. no CTX, no VB.no LOF,  Active fetal movement.  Denies: HA, visual changes, SOB, or RUQ/epigastric pain Pt is periodically vomiting  Maternal Medical History:   Past Medical History:  Diagnosis Date  . Abdominal pain affecting pregnancy 07/29/2015  . Anxiety   . [place] disorder ([place]   . Hematoma 10/19/2011  . Labor and delivery, indication for care 09/19/2015  . Nausea and vomiting of pregnancy, antepartum 05/24/2015  . Obesity complicating pregnancy, third trimester (CODE) 09/19/2015  . 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 Polysubstance abuse ([facility])    cocaine, marijuana, methadone, benzos  . Pregnancy 05/24/2015  . Umbilical hernia     Past Surgical History:  Procedure Laterality Date  . OVARY SURGERY    . THROAT SURGERY  2015   Remove a polyp  . TONSILLECTOMY    . UNILATERAL SALPINGECTOMY Left 2013    Allergies  Allergen Reactions  . Zofran [Ondansetron Hcl] Hives    Prior to Admission medications   Medication Sig Start Date End Date Taking? Authorizing Provider  acetaminophen (TYLENOL) 500 MG tablet Take 500 mg by mouth every 6 (six) hours as needed.    [provider]  ALPRAZolam (Duanne Moron 0.25 MG tablet Take 0.25 mg by mouth 2 (two) times daily as needed. 11/30/12   [provider]  amphetamine-dextroamphetamine (ADDERALL) 30 MG tablet  08/20/16   [provider]  aspirin EC 81  MG tablet Take 81 mg by mouth daily. Swallow whole.    [provider]  fluticasone (Asencion Islam 50 MCG/ACT nasal spray as needed Patient not taking: Reported on 05/22/2021 04/15/17   [provider]  ibuprofen (ADVIL,MOTRIN) 800 MG tablet Take 1 tablet (800 mg total) by mouth every 8 (eight) hours as needed. Patient not taking: Reported on 05/22/2021 12/31/16   BGregor Hams MD  phentermine 15 MG capsule Take 1 capsule (15 mg total) by mouth every morning. Patient not taking: Reported on 02/22/2021 11/13/20   FBirdie Sons MD  Prenatal Vit-Fe Fumarate-FA (MULTIVITAMIN-PRENATAL) 27-0.8 MG TABS tablet Take 1 tablet by mouth daily at 12 noon.    [provider]  promethazine (PHENERGAN) 12.5 MG tablet Take 1 tablet (12.5 mg total) by mouth every 8 (eight) hours as needed for nausea or vomiting. Patient not taking: Reported on 05/22/2021 05/10/20   PTrinna Post PA-C  ziprasidone (GEODON) 80 MG capsule Take 80 mg by mouth 2 (two) times daily with a meal.    [provider]      Social History: She  reports that she has been smoking cigarettes. She has been smoking an average of .5 packs per day. She has never used smokeless tobacco. She reports that she does not currently use drugs. She reports that she does not drink alcohol.  Family History: family history includes Cervical cancer in her maternal grandmother; Diabetes Mellitus I in her father and mother; Hypertension in her father; Lung cancer in her maternal aunt and maternal uncle.  no history of gyn cancers  Review of Systems: A full review of systems was performed and negative except as noted in the HPI.     O:  BP 131/63 (BP Location: Left Arm)   Pulse (!) 111   Temp 98.5 F (36.9 C) (Oral)   Resp 18   LMP 04/21/2021  No results found for this or any previous visit (from the past 48 hour(s)).   Constitutional: NAD, AAOx3  HE/ENT: extraocular movements grossly intact, moist mucous membranes CV:  RRR PULM: nl respiratory effort, CTABL     Abd: gravid, non-tender, non-distended, soft      Ext: Non-tender, Nonedematous   Psych: mood appropriate, speech normal Pelvic: deferred  Fetal  monitoring: Cat 1 Appropriate for GA Baseline: 145bpm Variability: moderate Accelerations: present x >2 Decelerations absent Contractions: rare  A/P: 36 y.o. 19w0dhere for antenatal surveillance for nausea and vomiting in pregnancy  Principle Diagnosis:  High risk pregnancy in third trimester  Labor: not present.  Fetal Wellbeing: Reassuring Cat 1 tracing. Reactive NST  D/c home stable, precautions reviewed, follow-up as scheduled.    DGertie Fey CNM '@TODAY'$ @ 11:59 PM

## 2021-12-09 ENCOUNTER — Other Ambulatory Visit: Payer: Self-pay

## 2021-12-09 ENCOUNTER — Encounter: Payer: Self-pay | Admitting: Obstetrics and Gynecology

## 2021-12-31 ENCOUNTER — Inpatient Hospital Stay
Admission: EM | Admit: 2021-12-31 | Discharge: 2022-01-03 | DRG: 768 | Disposition: A | Discharge status: Home / Self Care | Payer: Medicare Other | Attending: Obstetrics | Admitting: Obstetrics

## 2021-12-31 DIAGNOSIS — O42913 Preterm premature rupture of membranes, unspecified as to length of time between rupture and onset of labor, third trimester: Principal | ICD-10-CM | POA: Diagnosis present

## 2021-12-31 DIAGNOSIS — O9962 Diseases of the digestive system complicating childbirth: Secondary | ICD-10-CM | POA: Diagnosis present

## 2021-12-31 DIAGNOSIS — O1493 Unspecified pre-eclampsia, third trimester: Principal | ICD-10-CM | POA: Diagnosis present

## 2021-12-31 DIAGNOSIS — F1120 Opioid dependence, uncomplicated: Secondary | ICD-10-CM

## 2021-12-31 DIAGNOSIS — O1414 Severe pre-eclampsia complicating childbirth: Secondary | ICD-10-CM | POA: Diagnosis present

## 2021-12-31 DIAGNOSIS — O99344 Other mental disorders complicating childbirth: Secondary | ICD-10-CM | POA: Diagnosis present

## 2021-12-31 DIAGNOSIS — R0602 Shortness of breath: Secondary | ICD-10-CM | POA: Diagnosis not present

## 2021-12-31 DIAGNOSIS — D62 Acute posthemorrhagic anemia: Secondary | ICD-10-CM | POA: Diagnosis not present

## 2021-12-31 DIAGNOSIS — O99334 Smoking (tobacco) complicating childbirth: Secondary | ICD-10-CM | POA: Diagnosis present

## 2021-12-31 DIAGNOSIS — Z3A36 36 weeks gestation of pregnancy: Secondary | ICD-10-CM

## 2021-12-31 DIAGNOSIS — F17210 Nicotine dependence, cigarettes, uncomplicated: Secondary | ICD-10-CM | POA: Diagnosis not present

## 2021-12-31 DIAGNOSIS — R0600 Dyspnea, unspecified: Secondary | ICD-10-CM | POA: Diagnosis not present

## 2021-12-31 DIAGNOSIS — K429 Umbilical hernia without obstruction or gangrene: Secondary | ICD-10-CM | POA: Diagnosis not present

## 2021-12-31 DIAGNOSIS — O99320 Drug use complicating pregnancy, unspecified trimester: Secondary | ICD-10-CM | POA: Insufficient documentation

## 2021-12-31 DIAGNOSIS — F909 Attention-deficit hyperactivity disorder, unspecified type: Secondary | ICD-10-CM | POA: Diagnosis present

## 2021-12-31 DIAGNOSIS — O99824 Streptococcus B carrier state complicating childbirth: Secondary | ICD-10-CM | POA: Diagnosis present

## 2021-12-31 DIAGNOSIS — R079 Chest pain, unspecified: Secondary | ICD-10-CM | POA: Diagnosis not present

## 2021-12-31 DIAGNOSIS — O99324 Drug use complicating childbirth: Secondary | ICD-10-CM | POA: Diagnosis present

## 2021-12-31 DIAGNOSIS — O99214 Obesity complicating childbirth: Secondary | ICD-10-CM | POA: Diagnosis present

## 2021-12-31 DIAGNOSIS — O26893 Other specified pregnancy related conditions, third trimester: Secondary | ICD-10-CM | POA: Diagnosis not present

## 2021-12-31 DIAGNOSIS — F319 Bipolar disorder, unspecified: Secondary | ICD-10-CM | POA: Diagnosis present

## 2021-12-31 DIAGNOSIS — O9081 Anemia of the puerperium: Secondary | ICD-10-CM | POA: Diagnosis not present

## 2021-12-31 DIAGNOSIS — O24429 Gestational diabetes mellitus in childbirth, unspecified control: Secondary | ICD-10-CM | POA: Diagnosis present

## 2021-12-31 DIAGNOSIS — F1110 Opioid abuse, uncomplicated: Secondary | ICD-10-CM | POA: Diagnosis present

## 2021-12-31 LAB — RUPTURE OF MEMBRANE (ROM)PLUS: Rom Plus: POSITIVE

## 2021-12-31 NOTE — OB Triage Note (Signed)
Pt plans to deliver here at [facility] where she delivered her last 2 children [facility] was the only place she could get an appointment.

## 2021-12-31 NOTE — OB Triage Note (Signed)
Pt is 36.2 weeks with c/o possible leaking of fluid. Pt has a complicated medical hx with multiple socioeconomic issues.

## 2022-01-01 ENCOUNTER — Other Ambulatory Visit: Payer: Self-pay

## 2022-01-01 ENCOUNTER — Inpatient Hospital Stay: Payer: Medicare Other | Admitting: Registered Nurse

## 2022-01-01 ENCOUNTER — Encounter: Payer: Self-pay | Admitting: Obstetrics and Gynecology

## 2022-01-01 DIAGNOSIS — O99824 Streptococcus B carrier state complicating childbirth: Secondary | ICD-10-CM | POA: Diagnosis present

## 2022-01-01 DIAGNOSIS — F17210 Nicotine dependence, cigarettes, uncomplicated: Secondary | ICD-10-CM | POA: Diagnosis present

## 2022-01-01 DIAGNOSIS — O24429 Gestational diabetes mellitus in childbirth, unspecified control: Secondary | ICD-10-CM | POA: Diagnosis present

## 2022-01-01 DIAGNOSIS — K429 Umbilical hernia without obstruction or gangrene: Secondary | ICD-10-CM | POA: Diagnosis present

## 2022-01-01 DIAGNOSIS — D62 Acute posthemorrhagic anemia: Secondary | ICD-10-CM | POA: Diagnosis not present

## 2022-01-01 DIAGNOSIS — F319 Bipolar disorder, unspecified: Secondary | ICD-10-CM | POA: Diagnosis present

## 2022-01-01 DIAGNOSIS — O99320 Drug use complicating pregnancy, unspecified trimester: Secondary | ICD-10-CM | POA: Insufficient documentation

## 2022-01-01 DIAGNOSIS — O1414 Severe pre-eclampsia complicating childbirth: Secondary | ICD-10-CM | POA: Diagnosis present

## 2022-01-01 DIAGNOSIS — O99334 Smoking (tobacco) complicating childbirth: Secondary | ICD-10-CM | POA: Diagnosis present

## 2022-01-01 DIAGNOSIS — O9962 Diseases of the digestive system complicating childbirth: Secondary | ICD-10-CM | POA: Diagnosis present

## 2022-01-01 DIAGNOSIS — O42913 Preterm premature rupture of membranes, unspecified as to length of time between rupture and onset of labor, third trimester: Secondary | ICD-10-CM | POA: Diagnosis not present

## 2022-01-01 DIAGNOSIS — O26893 Other specified pregnancy related conditions, third trimester: Secondary | ICD-10-CM | POA: Diagnosis not present

## 2022-01-01 DIAGNOSIS — F909 Attention-deficit hyperactivity disorder, unspecified type: Secondary | ICD-10-CM | POA: Diagnosis present

## 2022-01-01 DIAGNOSIS — O99214 Obesity complicating childbirth: Secondary | ICD-10-CM | POA: Diagnosis present

## 2022-01-01 DIAGNOSIS — F1110 Opioid abuse, uncomplicated: Secondary | ICD-10-CM | POA: Diagnosis present

## 2022-01-01 DIAGNOSIS — F1120 Opioid dependence, uncomplicated: Secondary | ICD-10-CM

## 2022-01-01 DIAGNOSIS — O9081 Anemia of the puerperium: Secondary | ICD-10-CM | POA: Diagnosis not present

## 2022-01-01 DIAGNOSIS — O99324 Drug use complicating childbirth: Secondary | ICD-10-CM | POA: Diagnosis present

## 2022-01-01 DIAGNOSIS — O1493 Unspecified pre-eclampsia, third trimester: Principal | ICD-10-CM | POA: Diagnosis present

## 2022-01-01 DIAGNOSIS — O99344 Other mental disorders complicating childbirth: Secondary | ICD-10-CM | POA: Diagnosis present

## 2022-01-01 DIAGNOSIS — Z3A36 36 weeks gestation of pregnancy: Secondary | ICD-10-CM | POA: Diagnosis not present

## 2022-01-01 LAB — COMPREHENSIVE METABOLIC PANEL
ALT: 26 U/L (ref 0–44)
AST: 25 U/L (ref 15–41)
Albumin: 2.4 g/dL — ABNORMAL LOW (ref 3.5–5.0)
Alkaline Phosphatase: 149 U/L — ABNORMAL HIGH (ref 38–126)
Anion gap: 5 (ref 5–15)
BUN: 11 mg/dL (ref 6–20)
CO2: 26 mmol/L (ref 22–32)
Calcium: 8.9 mg/dL (ref 8.9–10.3)
Chloride: 104 mmol/L (ref 98–111)
Creatinine, Ser: 0.7 mg/dL (ref 0.44–1.00)
GFR, Estimated: >60 mL/min (ref >60)
Glucose, Bld: 140 mg/dL — ABNORMAL HIGH (ref 70–99)
Potassium: 4.6 mmol/L (ref 3.5–5.1)
Sodium: 135 mmol/L (ref 135–145)
Total Bilirubin: 0.4 mg/dL (ref 0.3–1.2)
Total Protein: 6.8 g/dL (ref 6.5–8.1)

## 2022-01-01 LAB — CBC
HCT: 29.5 % — ABNORMAL LOW (ref 36.0–46.0)
Hemoglobin: 9.1 g/dL — ABNORMAL LOW (ref 12.0–15.0)
MCH: 24.2 pg — ABNORMAL LOW (ref 26.0–34.0)
MCHC: 30.8 g/dL (ref 30.0–36.0)
MCV: 78.5 fL — ABNORMAL LOW (ref 80.0–100.0)
Platelets: 268 10*3/uL (ref 150–400)
RBC: 3.76 MIL/uL — ABNORMAL LOW (ref 3.87–5.11)
RDW: 16.1 % — ABNORMAL HIGH (ref 11.5–15.5)
WBC: 19.4 10*3/uL — ABNORMAL HIGH (ref 4.0–10.5)
nRBC: 0 % (ref 0.0–0.2)

## 2022-01-01 LAB — CBC WITH DIFFERENTIAL/PLATELET
Abs Immature Granulocytes: 0.03 10*3/uL (ref 0.00–0.07)
Basophils Absolute: 0 10*3/uL (ref 0.0–0.1)
Basophils Relative: 0 %
Eosinophils Absolute: 0.1 10*3/uL (ref 0.0–0.5)
Eosinophils Relative: 1 %
HCT: 36.4 % (ref 36.0–46.0)
Hemoglobin: 11.2 g/dL — ABNORMAL LOW (ref 12.0–15.0)
Immature Granulocytes: 0 %
Lymphocytes Relative: 24 %
Lymphs Abs: 2 10*3/uL (ref 0.7–4.0)
MCH: 24.4 pg — ABNORMAL LOW (ref 26.0–34.0)
MCHC: 30.8 g/dL (ref 30.0–36.0)
MCV: 79.3 fL — ABNORMAL LOW (ref 80.0–100.0)
Monocytes Absolute: 0.5 10*3/uL (ref 0.1–1.0)
Monocytes Relative: 6 %
Neutro Abs: 6 10*3/uL (ref 1.7–7.7)
Neutrophils Relative %: 69 %
Platelets: 290 10*3/uL (ref 150–400)
RBC: 4.59 MIL/uL (ref 3.87–5.11)
RDW: 16 % — ABNORMAL HIGH (ref 11.5–15.5)
WBC: 8.7 10*3/uL (ref 4.0–10.5)
nRBC: 0 % (ref 0.0–0.2)

## 2022-01-01 LAB — URINALYSIS, COMPLETE (UACMP) WITH MICROSCOPIC
Bacteria, UA: NONE SEEN
Bilirubin Urine: NEGATIVE
Glucose, UA: NEGATIVE mg/dL
Ketones, ur: NEGATIVE mg/dL
Leukocytes,Ua: NEGATIVE
Nitrite: NEGATIVE
Protein, ur: 100 mg/dL — AB
Specific Gravity, Urine: 1.03 (ref 1.005–1.030)
Squamous Epithelial / HPF: >50 — ABNORMAL HIGH (ref 0–5)
pH: 5 (ref 5.0–8.0)

## 2022-01-01 LAB — URINE DRUG SCREEN, QUALITATIVE (ARMC ONLY)
Amphetamines, Ur Screen: NOT DETECTED
Barbiturates, Ur Screen: NOT DETECTED
Benzodiazepine, Ur Scrn: NOT DETECTED
Cannabinoid 50 Ng, Ur ~~LOC~~: POSITIVE — AB
Cocaine Metabolite,Ur ~~LOC~~: NOT DETECTED
MDMA (Ecstasy)Ur Screen: NOT DETECTED
Methadone Scn, Ur: NOT DETECTED
Opiate, Ur Screen: POSITIVE — AB
Phencyclidine (PCP) Ur S: NOT DETECTED
Tricyclic, Ur Screen: NOT DETECTED

## 2022-01-01 LAB — GLUCOSE, CAPILLARY
Glucose-Capillary: 108 mg/dL — ABNORMAL HIGH (ref 70–99)
Glucose-Capillary: 96 mg/dL (ref 70–99)
Glucose-Capillary: 109 mg/dL — ABNORMAL HIGH (ref 70–99)
Glucose-Capillary: 119 mg/dL — ABNORMAL HIGH (ref 70–99)
Glucose-Capillary: 156 mg/dL — ABNORMAL HIGH (ref 70–99)
Glucose-Capillary: 99 mg/dL (ref 70–99)
Glucose-Capillary: 105 mg/dL — ABNORMAL HIGH (ref 70–99)

## 2022-01-01 LAB — PROTEIN / CREATININE RATIO, URINE
Creatinine, Urine: 274 mg/dL
Protein Creatinine Ratio: 0.54 mg/mg{Cre} — ABNORMAL HIGH (ref 0.00–0.15)
Total Protein, Urine: 147 mg/dL

## 2022-01-01 LAB — TYPE AND SCREEN
ABO/RH(D): O POS
Antibody Screen: NEGATIVE

## 2022-01-01 LAB — CHLAMYDIA/NGC RT PCR (ARMC ONLY)
Chlamydia Tr: NOT DETECTED
N gonorrhoeae: NOT DETECTED

## 2022-01-01 LAB — HEMOGLOBIN A1C
Hgb A1c MFr Bld: 6.5 % — ABNORMAL HIGH (ref 4.8–5.6)
Mean Plasma Glucose: 139.85 mg/dL

## 2022-01-01 LAB — OB RESULTS CONSOLE GBS: GBS: POSITIVE

## 2022-01-01 LAB — RPR: RPR Ser Ql: NONREACTIVE

## 2022-01-01 LAB — GROUP B STREP BY PCR: Group B strep by PCR: POSITIVE — AB

## 2022-01-01 MED ORDER — LABETALOL HCL 5 MG/ML IV SOLN: 40.0000 mg | INTRAVENOUS | Status: DC | PRN

## 2022-01-01 MED ORDER — MAGNESIUM SULFATE 40 GM/1000ML IV SOLN
2.0000 g/h | INTRAVENOUS | Status: DC
  Administered 2022-01-01: 2 g/h via INTRAVENOUS
  Filled 2022-01-01: qty 1000

## 2022-01-01 MED ORDER — LIDOCAINE HCL (PF) 1 % IJ SOLN: 30.0000 mL | INTRAMUSCULAR | Status: DC | PRN

## 2022-01-01 MED ORDER — PHENYLEPHRINE 80 MCG/ML (10ML) SYRINGE FOR IV PUSH (FOR BLOOD PRESSURE SUPPORT): 80.0000 ug | PREFILLED_SYRINGE | INTRAVENOUS | Status: DC | PRN

## 2022-01-01 MED ORDER — OXYTOCIN BOLUS FROM INFUSION
333.0000 mL | Freq: Once | INTRAVENOUS | Status: AC
  Administered 2022-01-01: 333 mL via INTRAVENOUS

## 2022-01-01 MED ORDER — LACTATED RINGERS IV SOLN: INTRAVENOUS | Status: DC

## 2022-01-01 MED ORDER — DIPHENHYDRAMINE HCL 25 MG PO CAPS: 25.0000 mg | ORAL_CAPSULE | Freq: Four times a day (QID) | ORAL | Status: DC | PRN

## 2022-01-01 MED ORDER — LIDOCAINE HCL (PF) 1 % IJ SOLN
INTRAMUSCULAR | Status: AC
  Filled 2022-01-01: qty 30

## 2022-01-01 MED ORDER — LABETALOL HCL 5 MG/ML IV SOLN: 20.0000 mg | INTRAVENOUS | Status: DC | PRN

## 2022-01-01 MED ORDER — MISOPROSTOL 200 MCG PO TABS
ORAL_TABLET | ORAL | Status: AC
  Filled 2022-01-01: qty 4

## 2022-01-01 MED ORDER — SENNOSIDES-DOCUSATE SODIUM 8.6-50 MG PO TABS
2.0000 | ORAL_TABLET | Freq: Every day | ORAL | Status: DC
  Administered 2022-01-02: 2 via ORAL
  Filled 2022-01-01 (×2): qty 2

## 2022-01-01 MED ORDER — FENTANYL-BUPIVACAINE-NACL 0.5-0.125-0.9 MG/250ML-% EP SOLN
EPIDURAL | Status: AC
  Filled 2022-01-01: qty 250

## 2022-01-01 MED ORDER — TERBUTALINE SULFATE 1 MG/ML IJ SOLN: 0.2500 mg | Freq: Once | INTRAMUSCULAR | Status: DC | PRN

## 2022-01-01 MED ORDER — LIDOCAINE HCL (PF) 1 % IJ SOLN
INTRAMUSCULAR | Status: DC | PRN
  Administered 2022-01-01: 3 mL via SUBCUTANEOUS

## 2022-01-01 MED ORDER — MAGNESIUM SULFATE 40 GM/1000ML IV SOLN
2.0000 g/h | INTRAVENOUS | Status: DC
  Administered 2022-01-01 (×2): 2 g/h via INTRAVENOUS
  Filled 2022-01-01: qty 1000

## 2022-01-01 MED ORDER — SODIUM CHLORIDE 0.9 % IV SOLN
2.0000 g | Freq: Once | INTRAVENOUS | Status: AC
  Administered 2022-01-01: 2 g via INTRAVENOUS
  Filled 2022-01-01: qty 2000

## 2022-01-01 MED ORDER — ZOLPIDEM TARTRATE 5 MG PO TABS: 5.0000 mg | ORAL_TABLET | Freq: Every evening | ORAL | Status: DC | PRN

## 2022-01-01 MED ORDER — WITCH HAZEL-GLYCERIN EX PADS: 1.0000 | MEDICATED_PAD | CUTANEOUS | Status: DC | PRN

## 2022-01-01 MED ORDER — MISOPROSTOL 200 MCG PO TABS
800.0000 ug | ORAL_TABLET | Freq: Once | ORAL | Status: AC
  Administered 2022-01-01: 800 ug via RECTAL

## 2022-01-01 MED ORDER — SOD CITRATE-CITRIC ACID 500-334 MG/5ML PO SOLN
30.0000 mL | ORAL | Status: DC | PRN
  Administered 2022-01-01: 30 mL via ORAL

## 2022-01-01 MED ORDER — OXYTOCIN-SODIUM CHLORIDE 30-0.9 UT/500ML-% IV SOLN
2.5000 [IU]/h | INTRAVENOUS | Status: DC
  Administered 2022-01-01: 2.5 [IU]/h via INTRAVENOUS
  Filled 2022-01-01: qty 500

## 2022-01-01 MED ORDER — DIPHENHYDRAMINE HCL 50 MG/ML IJ SOLN: 12.5000 mg | INTRAMUSCULAR | Status: DC | PRN

## 2022-01-01 MED ORDER — TRANEXAMIC ACID-NACL 1000-0.7 MG/100ML-% IV SOLN
INTRAVENOUS | Status: AC
  Administered 2022-01-01: 1000 mg via INTRAVENOUS
  Filled 2022-01-01: qty 100

## 2022-01-01 MED ORDER — EPHEDRINE 5 MG/ML INJ: 10.0000 mg | INTRAVENOUS | Status: DC | PRN

## 2022-01-01 MED ORDER — OXYTOCIN 10 UNIT/ML IJ SOLN
INTRAMUSCULAR | Status: AC
  Filled 2022-01-01: qty 1

## 2022-01-01 MED ORDER — FENTANYL-BUPIVACAINE-NACL 0.5-0.125-0.9 MG/250ML-% EP SOLN
EPIDURAL | Status: DC | PRN
  Administered 2022-01-01: 12 mL/h via EPIDURAL

## 2022-01-01 MED ORDER — HYDRALAZINE HCL 20 MG/ML IJ SOLN: 10.0000 mg | INTRAMUSCULAR | Status: DC | PRN

## 2022-01-01 MED ORDER — OXYTOCIN 10 UNIT/ML IJ SOLN
INTRAMUSCULAR | Status: AC
  Filled 2022-01-01: qty 2

## 2022-01-01 MED ORDER — COCONUT OIL OIL: 1.0000 | TOPICAL_OIL | Status: DC | PRN

## 2022-01-01 MED ORDER — ACETAMINOPHEN 325 MG PO TABS
650.0000 mg | ORAL_TABLET | ORAL | Status: DC | PRN
  Administered 2022-01-02 – 2022-01-03 (×3): 650 mg via ORAL
  Filled 2022-01-01 (×3): qty 2

## 2022-01-01 MED ORDER — FENTANYL CITRATE (PF) 100 MCG/2ML IJ SOLN
100.0000 ug | Freq: Once | INTRAMUSCULAR | Status: AC
  Administered 2022-01-01: 100 ug via INTRAVENOUS

## 2022-01-01 MED ORDER — SIMETHICONE 80 MG PO CHEW
80.0000 mg | CHEWABLE_TABLET | ORAL | Status: DC | PRN
  Administered 2022-01-03: 80 mg via ORAL
  Filled 2022-01-01: qty 1

## 2022-01-01 MED ORDER — DIBUCAINE (PERIANAL) 1 % EX OINT: 1.0000 | TOPICAL_OINTMENT | CUTANEOUS | Status: DC | PRN

## 2022-01-01 MED ORDER — SODIUM CHLORIDE 0.9 % IV SOLN
300.0000 mg | INTRAVENOUS | Status: DC
  Administered 2022-01-02: 300 mg via INTRAVENOUS
  Filled 2022-01-01: qty 300

## 2022-01-01 MED ORDER — SODIUM CHLORIDE 0.9 % IV SOLN
5.0000 10*6.[IU] | Freq: Once | INTRAVENOUS | Status: AC
  Administered 2022-01-01: 5 10*6.[IU] via INTRAVENOUS
  Filled 2022-01-01: qty 5

## 2022-01-01 MED ORDER — FENTANYL CITRATE (PF) 100 MCG/2ML IJ SOLN
INTRAMUSCULAR | Status: AC
  Filled 2022-01-01: qty 2

## 2022-01-01 MED ORDER — LIDOCAINE-EPINEPHRINE (PF) 1.5 %-1:200000 IJ SOLN
INTRAMUSCULAR | Status: DC | PRN
  Administered 2022-01-01: 3 mL via EPIDURAL

## 2022-01-01 MED ORDER — LACTATED RINGERS IV SOLN
500.0000 mL | Freq: Once | INTRAVENOUS | Status: AC
  Administered 2022-01-01: 500 mL via INTRAVENOUS

## 2022-01-01 MED ORDER — LABETALOL HCL 5 MG/ML IV SOLN: 80.0000 mg | INTRAVENOUS | Status: DC | PRN

## 2022-01-01 MED ORDER — BUPIVACAINE HCL (PF) 0.25 % IJ SOLN
INTRAMUSCULAR | Status: DC | PRN
  Administered 2022-01-01: 2 mL via EPIDURAL
  Administered 2022-01-01: 4 mL via EPIDURAL

## 2022-01-01 MED ORDER — OXYTOCIN-SODIUM CHLORIDE 30-0.9 UT/500ML-% IV SOLN
1.0000 m[IU]/min | INTRAVENOUS | Status: DC
  Administered 2022-01-01: 2 m[IU]/min via INTRAVENOUS

## 2022-01-01 MED ORDER — OXYCODONE-ACETAMINOPHEN 5-325 MG PO TABS
2.0000 | ORAL_TABLET | ORAL | Status: AC | PRN
  Administered 2022-01-01 – 2022-01-02 (×4): 2 via ORAL
  Filled 2022-01-01 (×4): qty 2

## 2022-01-01 MED ORDER — LACTATED RINGERS IV SOLN: 500.0000 mL | INTRAVENOUS | Status: DC | PRN

## 2022-01-01 MED ORDER — PRENATAL MULTIVITAMIN CH
1.0000 | ORAL_TABLET | Freq: Every day | ORAL | Status: DC
  Administered 2022-01-02 – 2022-01-03 (×2): 1 via ORAL
  Filled 2022-01-01 (×2): qty 1

## 2022-01-01 MED ORDER — AMMONIA AROMATIC IN INHA
RESPIRATORY_TRACT | Status: AC
  Filled 2022-01-01: qty 10

## 2022-01-01 MED ORDER — BENZOCAINE-MENTHOL 20-0.5 % EX AERO
1.0000 | INHALATION_SPRAY | CUTANEOUS | Status: DC | PRN
  Filled 2022-01-01: qty 56

## 2022-01-01 MED ORDER — MISOPROSTOL 50MCG HALF TABLET
50.0000 ug | ORAL_TABLET | ORAL | Status: DC | PRN
  Administered 2022-01-01: 50 ug via ORAL
  Filled 2022-01-01: qty 1

## 2022-01-01 MED ORDER — LABETALOL HCL 5 MG/ML IV SOLN
INTRAVENOUS | Status: AC
  Administered 2022-01-01: 20 mg via INTRAVENOUS
  Filled 2022-01-01: qty 4

## 2022-01-01 MED ORDER — MAGNESIUM SULFATE BOLUS VIA INFUSION
4.0000 g | Freq: Once | INTRAVENOUS | Status: AC
  Administered 2022-01-01: 4 g via INTRAVENOUS
  Filled 2022-01-01: qty 1000

## 2022-01-01 MED ORDER — ACETAMINOPHEN 325 MG PO TABS: 650.0000 mg | ORAL_TABLET | ORAL | Status: DC | PRN

## 2022-01-01 MED ORDER — IBUPROFEN 600 MG PO TABS
600.0000 mg | ORAL_TABLET | Freq: Four times a day (QID) | ORAL | Status: DC
  Administered 2022-01-01 – 2022-01-03 (×8): 600 mg via ORAL
  Filled 2022-01-01 (×8): qty 1

## 2022-01-01 MED ORDER — SODIUM CHLORIDE 0.9 % IV SOLN: 12.5000 mg | Freq: Four times a day (QID) | INTRAVENOUS | Status: DC | PRN

## 2022-01-01 MED ORDER — PENICILLIN G POT IN DEXTROSE 60000 UNIT/ML IV SOLN
3.0000 10*6.[IU] | INTRAVENOUS | Status: DC
  Administered 2022-01-01 (×3): 3 10*6.[IU] via INTRAVENOUS
  Filled 2022-01-01 (×3): qty 50

## 2022-01-01 MED ORDER — TETANUS-DIPHTH-ACELL PERTUSSIS 5-2.5-18.5 LF-MCG/0.5 IM SUSY: 0.5000 mL | PREFILLED_SYRINGE | Freq: Once | INTRAMUSCULAR | Status: DC

## 2022-01-01 MED ORDER — TRANEXAMIC ACID-NACL 1000-0.7 MG/100ML-% IV SOLN: 1000.0000 mg | INTRAVENOUS | Status: AC
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
## 2022-01-01 NOTE — H&P (Signed)
OB History & Physical   History of Present Illness:  Chief Complaint: leaking of fluid  HPI:  Suzanne Moses is a 36 y.o. [id number] female at 6w3ddated by LMP 04/21/21, c/w [place] 164w5dEDD 01/26/22.  She presents to L&D for possible ROM. Reports LOF @ 9/25 at 8pm; no current UCs, denies HA or RUQ pain but having some visual disturbance.      Pregnancy Issues: Obesity, pre-pregnancy BMI 51 Bipolar/ADHD: prior to pregnancy on xanax, adderall, geodon; none with preg.  History of preterm delivery, 34wks with g1, 37wks with G2.  Opioid use disorder, taking Suboxone 8-'2mg'$  daily Tobacco use: 1/2 to 1ppd Umbilical hernia GDM: 1hr 173, never completed 3hr.  Scant Prenatal care: last seen at 31wks, total 5 visits   Maternal Medical History:   Past Medical History:  Diagnosis Date   Abdominal pain affecting pregnancy 07/29/2015   Anxiety    [place]   Hematoma 10/19/2011   Labor and delivery, indication for care 09/19/2015   Nausea and vomiting of pregnancy, antepartum 0216/55/3748 Obesity complicating pregnancy, third trimester (CODE) 09/19/2015   Patient non-compliant, refused intervention or support    history of leaving AMA, 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   Polysubstance abuse ([place]   cocaine, marijuana, methadone, benzos   Pregnancy 0227/10/8673 Umbilical hernia     Past Surgical History:  Procedure Laterality Date   OVARY SURGERY     THROAT SURGERY  2015   Remove a polyp   TONSILLECTOMY     UNILATERAL SALPINGECTOMY Left 2013    Allergies  Allergen Reactions   Zofran [Ondansetron Hcl] Hives    Prior to Admission medications   Medication Sig Start Date End Date Taking? Authorizing Provider  acetaminophen (TYLENOL) 500 MG tablet Take 500 mg by mouth every 6 (six) hours as needed.    [provider]  ALPRAZolam (XDuanne Moron0.25 MG tablet Take 0.25 mg by mouth 2 (two) times daily as needed. 11/30/12   [provider]   amphetamine-dextroamphetamine (ADDERALL) 30 MG tablet  08/20/16   [provider]  aspirin EC 81 MG tablet Take 81 mg by mouth daily. Swallow whole.    [provider]  fluticasone (FAsencion Islam50 MCG/ACT nasal spray as needed Patient not taking: Reported on 05/22/2021 04/15/17   [provider]  ibuprofen (ADVIL,MOTRIN) 800 MG tablet Take 1 tablet (800 mg total) by mouth every 8 (eight) hours as needed. Patient not taking: Reported on 05/22/2021 12/31/16   BrGregor HamsMD  phentermine 15 MG capsule Take 1 capsule (15 mg total) by mouth every morning. Patient not taking: Reported on 02/22/2021 11/13/20   FiBirdie SonsMD  Prenatal Vit-Fe Fumarate-FA (MULTIVITAMIN-PRENATAL) 27-0.8 MG TABS tablet Take 1 tablet by mouth daily at 12 noon.    [provider]  promethazine (PHENERGAN) 12.5 MG tablet Take 1 tablet (12.5 mg total) by mouth every 8 (eight) hours as needed for nausea or vomiting. Patient not taking: Reported on 05/22/2021 05/10/20   PoTrinna PostPA-C  ziprasidone (GEODON) 80 MG capsule Take 80 mg by mouth 2 (two) times daily with a meal.    [provider]     [facility] Social History: She  reports that she has been smoking cigarettes. She has been smoking an average of .5 packs per day. She has never used smokeless tobacco. She reports that she does not currently use drugs. She reports that she does  not drink alcohol.  Family History: family history includes Cervical cancer in her maternal grandmother; Diabetes Mellitus I in her father and mother; Hypertension in her father; Lung cancer in her maternal aunt and maternal uncle.   Review of Systems: A full review of systems was performed and negative except as noted in the HPI.     Physical Exam:  Vital Signs: BP (!) 150/92   Pulse 85   Temp 98.8 F (37.1 C) (Oral)   Resp 16   Ht '5\' 4"'$  (1.626 m)   Wt 132.9 kg   LMP 04/21/2021   BMI 50.29 kg/m   General: no  acute distress.  HEENT: normocephalic, atraumatic Heart: regular rate & rhythm.  No murmurs/rubs/gallops Lungs: clear to auscultation bilaterally, normal respiratory effort Abdomen: soft, gravid, non-tender; approx 3cm umbilical hernia noted,  EFW: 8lbs, exam limited by habitus.  Pelvic:   External: Normal external female genitalia  Cervix:2/80/-2, soft/midposition; intact membranes noted, scant pink fluid on glove with exam.    Extremities: non-tender, symmetric, 1+ edema bilaterally.  DTRs: 2+  Neurologic: Alert & oriented x 3.    Results for orders placed or performed during the hospital encounter of 12/31/21 (from the past 24 hour(s))  ROM Plus ([facility] only)     Status: None   Collection Time: 12/31/21 11:16 PM  Result Value Ref Range   Rom Plus POSITIVE   CBC with Differential     Status: Abnormal   Collection Time: 12/31/21 11:50 PM  Result Value Ref Range   WBC 8.7 4.0 - 10.5 K/uL   RBC 4.59 3.87 - 5.11 MIL/uL   Hemoglobin 11.2 (L) 12.0 - 15.0 g/dL   HCT 36.4 36.0 - 46.0 %   MCV 79.3 (L) 80.0 - 100.0 fL   MCH 24.4 (L) 26.0 - 34.0 pg   MCHC 30.8 30.0 - 36.0 g/dL   RDW 16.0 (H) 11.5 - 15.5 %   Platelets 290 150 - 400 K/uL   nRBC 0.0 0.0 - 0.2 %   Neutrophils Relative % 69 %   Neutro Abs 6.0 1.7 - 7.7 K/uL   Lymphocytes Relative 24 %   Lymphs Abs 2.0 0.7 - 4.0 K/uL   Monocytes Relative 6 %   Monocytes Absolute 0.5 0.1 - 1.0 K/uL   Eosinophils Relative 1 %   Eosinophils Absolute 0.1 0.0 - 0.5 K/uL   Basophils Relative 0 %   Basophils Absolute 0.0 0.0 - 0.1 K/uL   Immature Granulocytes 0 %   Abs Immature Granulocytes 0.03 0.00 - 0.07 K/uL  Comprehensive metabolic panel     Status: Abnormal   Collection Time: 12/31/21 11:50 PM  Result Value Ref Range   Sodium 135 135 - 145 mmol/L   Potassium 4.6 3.5 - 5.1 mmol/L   Chloride 104 98 - 111 mmol/L   CO2 26 22 - 32 mmol/L   Glucose, Bld 140 (H) 70 - 99 mg/dL   BUN 11 6 - 20 mg/dL   Creatinine, Ser 0.70 0.44 - 1.00 mg/dL    Calcium 8.9 8.9 - 10.3 mg/dL   Total Protein 6.8 6.5 - 8.1 g/dL   Albumin 2.4 (L) 3.5 - 5.0 g/dL   AST 25 15 - 41 U/L   ALT 26 0 - 44 U/L   Alkaline Phosphatase 149 (H) 38 - 126 U/L   Total Bilirubin 0.4 0.3 - 1.2 mg/dL   GFR, Estimated >60 >60 mL/min   Anion gap 5 5 - 15  Protein / creatinine ratio, urine  Status: Abnormal   Collection Time: 01/01/22 12:11 AM  Result Value Ref Range   Creatinine, Urine 274 mg/dL   Total Protein, Urine 147 mg/dL   Protein Creatinine Ratio 0.54 (H) 0.00 - 0.15 mg/mg[Cre]  Urinalysis, Complete w Microscopic     Status: Abnormal   Collection Time: 01/01/22 12:11 AM  Result Value Ref Range   Color, Urine YELLOW (A) YELLOW   APPearance CLOUDY (A) CLEAR   Specific Gravity, Urine 1.030 1.005 - 1.030   pH 5.0 5.0 - 8.0   Glucose, UA NEGATIVE NEGATIVE mg/dL   Hgb urine dipstick SMALL (A) NEGATIVE   Bilirubin Urine NEGATIVE NEGATIVE   Ketones, ur NEGATIVE NEGATIVE mg/dL   Protein, ur 100 (A) NEGATIVE mg/dL   Nitrite NEGATIVE NEGATIVE   Leukocytes,Ua NEGATIVE NEGATIVE   RBC / HPF 11-20 0 - 5 RBC/hpf   WBC, UA 21-50 0 - 5 WBC/hpf   Bacteria, UA NONE SEEN NONE SEEN   Squamous Epithelial / LPF >50 (H) 0 - 5   Mucus PRESENT    Non Squamous Epithelial PRESENT (A) NONE SEEN  Urine Drug Screen, Qualitative ([facility] only)     Status: Abnormal   Collection Time: 01/01/22 12:11 AM  Result Value Ref Range   Tricyclic, Ur Screen NONE DETECTED NONE DETECTED   Amphetamines, Ur Screen NONE DETECTED NONE DETECTED   MDMA (Ecstasy)Ur Screen NONE DETECTED NONE DETECTED   Cocaine Metabolite,Ur [place] NONE DETECTED NONE DETECTED   Opiate, Ur Screen POSITIVE (A) NONE DETECTED   Phencyclidine (PCP) Ur S NONE DETECTED NONE DETECTED   Cannabinoid 50 Ng, Ur [place] POSITIVE (A) NONE DETECTED   Barbiturates, Ur Screen NONE DETECTED NONE DETECTED   Benzodiazepine, Ur Scrn NONE DETECTED NONE DETECTED   Methadone Scn, Ur NONE DETECTED NONE DETECTED    Pertinent Results:  Prenatal  Labs: Blood type/Rh O  Pos  Antibody screen neg  Rubella Immune  Varicella Immune  RPR NR  HBsAg Neg  HIV NR  GC neg  Chlamydia neg  Genetic screening negative  1 hour GTT  173; initial A1C 4.8 1st trimeter  3 hour GTT  Not done  GBS  POS by PCR   FHT: 125bpm, mod var, + accels, no decels TOCO: no UCs noted.    Cephalic by [place]  No results found.  Assessment:  Suzanne Moses is a 36 y.o. [phone] female at 72w3dwith PPROM, Pre-Eclampsia, and GDM.   Plan:  1. Admit to Labor & Delivery; consents reviewed and obtained - Opioid Use d/o: on suboxone, only taking 1/4 sublingual film daily, denies current use of other illicit substances, + UDS for MJ and opiates, informed that neonate will be screened. [facility]  - Preeclampsia: elevated BP mild range, proteinuria; discussed potential sequelae, possible need for Mag sulfate.  - GDM:  uncontrolled, will complete q2hr CBG, pt had been drinking mountain Dew on arrival prior to glucose 140 on CMP. If > 120 x 2, will initiate Endotool.  - PPROM: confirmed with ROMPlus, plan augmentation with cytotec oral dosing.  - Preterm: NICU staff aware.   2. Fetal Well being  - Fetal Tracing: Cat I - Group B Streptococcus ppx indicated: POS- PCN ordered - Presentation: cephalic confirmed by Bedside [place]  3. Routine OB: - Prenatal labs reviewed, as above - Rh  O Pos - CBC, T&S, RPR on admit - Clear fluids, IVF  4. Induction of Labor -  Contractions: external  toco in place -  Pelvis proven -  Plan for induction with cytotec oral dosing 20mg q4hr -  Plan for continuous fetal monitoring  -  Maternal pain control as desired; plans epidural.  - Anticipate vaginal delivery  5. Post Partum Planning: - Infant feeding: TBD - Contraception: TBD  RFrancetta Found CNM 01/01/22 1:39 AM

## 2022-01-01 NOTE — Progress Notes (Addendum)
Labor Progress Note  Suzanne Moses is a 36 y.o. G3P1102 at 85w3dby LMP admitted for PPROM and pre-eclampsia  Subjective: she is comfortable after her epidural Notified by RN that patient has had two severe range blood pressures  Objective: BP (!) 150/83   Pulse 76   Temp 98 F (36.7 C) (Oral)   Resp 16   Ht '5\' 4"'$  (1.626 m)   Wt 132.9 kg   LMP 04/21/2021   SpO2 94%   BMI 50.29 kg/m  Vitals:   01/01/22 0401 01/01/22 0617 01/01/22 0710 01/01/22 1025  BP: (!) 144/71 (!) 155/71 139/88 (!) 181/96   01/01/22 1030 01/01/22 1033 01/01/22 1038 01/01/22 1048  BP: (!) 153/88 (!) 157/94 (!) 155/86 (!) 168/104   01/01/22 1120 01/01/22 1135 01/01/22 1150 01/01/22 1206  BP: (!) 149/90 (!) 171/98 (!) 167/87 (!) 150/83    Notable VS details: reviewed, labetalol protocol initiated, magnesium infusion to be started.  Fetal Assessment: FHT:  FHR: 135 bpm, variability: moderate,  accelerations:  Present,  decelerations:  Absent Category/reactivity:  Category I UC:   regular, every 2-5 minutes SVE:    Dilation: 6cm  Effacement: 90%  Station:  0  Consistency: soft  Position: anterior  Membrane status:SROM 12/31/21 @ 2000, AROM @ 0923 Amniotic color: clear  Labs: Lab Results  Component Value Date   WBC 8.7 12/31/2021   HGB 11.2 (L) 12/31/2021   HCT 36.4 12/31/2021   MCV 79.3 (L) 12/31/2021   PLT 290 12/31/2021    Assessment / Plan: 36year old G3P1102 with PPROM and pre-eclampsia, being induced with cytotec and pitocin  Labor:  Received one dose of cytotec, now augmenting with pitocin. Pitocin currently at  837mmin Preeclampsia:   Has had two severe range blood pressures in a row. Starting labetalol protocol. BP returned to 150s/80s. Per Dr. ScOuida Sillsstart magnesium at this time. Magnesium ordered and started. SCDs initiated. Hourly I&O. Fetal Wellbeing:  Category I Pain Control:  Epidural I/D:   GBS positive, received 2 doses PCN, starting 3rd dose PCN Anticipated MOD:   NSVD  DaGertie FeyCNM 01/01/2022, 12:19 PM

## 2022-01-01 NOTE — Anesthesia Procedure Notes (Signed)
Epidural Patient location during procedure: OB Start time: 01/01/2022 10:15 AM End time: 01/01/2022 10:19 AM  Staffing Anesthesiologist: Arita Miss, MD Resident/CRNA: Jerrye Noble, CRNA Performed: resident/CRNA   Preanesthetic Checklist Completed: patient identified, IV checked, site marked, risks and benefits discussed, surgical consent, monitors and equipment checked, pre-op evaluation and timeout performed  Epidural Patient position: sitting Prep: ChloraPrep Patient monitoring: heart rate, continuous pulse ox and blood pressure Approach: midline Location: L3-L4 Injection technique: LOR saline  Needle:  Needle type: Tuohy  Needle gauge: 17 G Needle length: 9 cm and 9 Needle insertion depth: 9 cm Catheter type: closed end flexible Catheter size: 19 Gauge Catheter at skin depth: 13 cm Test dose: negative and 1.5% lidocaine with Epi 1:200 K  Assessment Events: blood not aspirated, injection not painful, no injection resistance, no paresthesia and negative IV test  Additional Notes 1 attempt Pt. Evaluated and documentation done after procedure finished. Patient identified. Risks/Benefits/Options discussed with patient including but not limited to bleeding, infection, nerve damage, paralysis, failed block, incomplete pain control, headache, blood pressure changes, nausea, vomiting, reactions to medication both or allergic, itching and postpartum back pain. Confirmed with bedside nurse the patient's most recent platelet count. Confirmed with patient that they are not currently taking any anticoagulation, have any bleeding history or any family history of bleeding disorders. Patient expressed understanding and wished to proceed. All questions were answered. Sterile technique was used throughout the entire procedure. Please see nursing notes for vital signs. Test dose was given through epidural catheter and negative prior to continuing to dose epidural or start infusion. Warning  signs of high block given to the patient including shortness of breath, tingling/numbness in hands, complete motor block, or any concerning symptoms with instructions to call for help. Patient was given instructions on fall risk and not to get out of bed. All questions and concerns addressed with instructions to call with any issues or inadequate analgesia.    Patient tolerated the insertion well without immediate complications.Reason for block:procedure for pain

## 2022-01-01 NOTE — Progress Notes (Signed)
Labor Progress Note  Suzanne Moses is a 36 y.o. A1K5537 at 46w3dby LMP admitted for PPROM and pre-eclampsia  Subjective: She feels vaginal pressure, denies painful contractions  Objective: BP 139/88 (BP Location: Right Arm)   Pulse 84   Temp 98.4 F (36.9 C) (Oral)   Resp 16   Ht '5\' 4"'$  (1.626 m)   Wt 132.9 kg   LMP 04/21/2021   BMI 50.29 kg/m  Vitals:   12/31/21 2313 12/31/21 2319 12/31/21 2334 01/01/22 0044  BP: (!) 155/90 (!) 147/94 (!) 150/88 (!) 161/95   01/01/22 0047 01/01/22 0401 01/01/22 0617 01/01/22 0710  BP: (!) 150/92 (!) 144/71 (!) 155/71 139/88    Notable VS details: reviewed  Fetal Assessment: FHT:  FHR: 125 bpm, variability: moderate,  accelerations:  Present,  decelerations:  Absent Category/reactivity:  Category I UC:   regular, every 2-3 minutes SVE:    Dilation: 5cm  Effacement: 90%  Station:  0  Consistency: soft  Position: anterior  Membrane status:BBOW, SROM @ 8pm Amniotic color: pink-tinged  Labs: Lab Results  Component Value Date   WBC 8.7 12/31/2021   HGB 11.2 (L) 12/31/2021   HCT 36.4 12/31/2021   MCV 79.3 (L) 12/31/2021   PLT 290 12/31/2021    Assessment / Plan: 36year old G3P1102 with PPROM and pre-eclampsia, being induced with cytotec and pitocin  Labor:  Received one dose of cytotec, good cervical change, will begin augmentation with pitocin after receiving second dose of PCN Preeclampsia:  labs stable, BP stable Fetal Wellbeing:  Category I Pain Control:   planning epidural I/D:   GBS positive, receiving second dose PCN now Anticipated MOD:  NSVD  DGertie Fey CNM 01/01/2022, 8:07 AM

## 2022-01-01 NOTE — Anesthesia Preprocedure Evaluation (Signed)
Anesthesia Evaluation  Patient identified by MRN, date of birth, ID band Patient awake    Reviewed: Allergy & Precautions, H&P , NPO status , Patient's Chart, lab work & pertinent test results  Airway Mallampati: III  TM Distance: >3 FB Neck ROM: full    Dental  (+) Poor Dentition, Chipped   Pulmonary Current SmokerPatient did not abstain from smoking.,    Pulmonary exam normal        Cardiovascular hypertension, Normal cardiovascular exam     Neuro/Psych PSYCHIATRIC DISORDERS Anxiety Bipolar Disorder    GI/Hepatic Neg liver ROS, GERD  ,  Endo/Other  diabetes, Gestational  Renal/GU negative Renal ROS     Musculoskeletal   Abdominal   Peds  Hematology negative hematology ROS (+)   Anesthesia Other Findings   Reproductive/Obstetrics (+) Pregnancy                             Anesthesia Physical Anesthesia Plan  ASA: 3  Anesthesia Plan: Epidural   Post-op Pain Management:    Induction:   PONV Risk Score and Plan:   Airway Management Planned:   Additional Equipment:   Intra-op Plan:   Post-operative Plan:   Informed Consent: I have reviewed the patients History and Physical, chart, labs and discussed the procedure including the risks, benefits and alternatives for the proposed anesthesia with the patient or authorized representative who has indicated his/her understanding and acceptance.     Dental Advisory Given  Plan Discussed with: Anesthesiologist and CRNA  Anesthesia Plan Comments:         Anesthesia Quick Evaluation

## 2022-01-01 NOTE — Progress Notes (Signed)
Patient ID: Suzanne Moses, female   DOB: Jan 03, 1986, 36 y.o.   MRN: [id number] Peds notify me of humeral left arm fx . I have spoke to the father and patient about this . EBL 1050 cc, qbl pending

## 2022-01-01 NOTE — Op Note (Unsigned)
NAME: Botz, JONTE WOLLAM MEDICAL RECORD NO: [id number] ACCOUNT NO: [id number] DATE OF BIRTH: 06/09/1985 FACILITY: [facility] LOCATION: [facility] PHYSICIAN: Boykin Nearing, MD  Operative Report   DATE OF PROCEDURE: 01/01/2022  DELIVERY NOTE  PREOPERATIVE DIAGNOSES:  1.  36+3 weeks estimated gestational age. 2.  Preeclampsia with severe features. 3.  Morbid obesity. 4.  Shoulder dystocia - severe.  POSTDELIVERY DIAGNOSES: 1.  36+3 weeks estimated gestational age. 2.  Preeclampsia with severe features. 3.  Morbid obesity. 4.  Shoulder dystocia - severe.  PROCEDURE: 1.  McRoberts maneuver. 2.  Woods corkscrew. 3.  Delivery of posterior shoulder with ultimate vaginal delivery.  ANESTHESIA:  Epidural.  SURGEON:  Boykin Nearing, MD  FIRST ASSISTANT:  Lucrezia Europe, certified nurse midwife.  INDICATIONS:  The patient was in the process of delivering with Lucrezia Europe, certified nurse midwife when the head delivered and nurse midwife was unable to deliver the shoulders.  I was called 3 minutes into the severe shoulder dystocia and promptly  arrived in the labor and delivery, and directed the patient to be in the supine position.  Fetus position was left occiput anterior 30 degrees off midline.  At this point, McRoberts maneuver was performed again and again anterior shoulder could not be  delivered.  I promptly placed my hand into the posterior aspect and was able to deliver the fetal arm posteriorly with a pop sound that was heard.  The baby then was delivered with ease, and a floppy female was placed up onto the mother's abdomen while the  cord was doubly clamped quickly.  Infant was passed to awaiting nursery staff who assigned Apgar scores of 0 and 8.  Infant was given positive pressure ventilation and responded well to this.  Cord gas arterial 7.3, CO2 of 53 and base deficit 26.  The  placenta was then delivered by certified nurse midwife and second-degree laceration was  repaired.  Mother continued to bleed, which required me to return to the room and place a JADA uterine instrument to allow for compression of the uterus.  120 mL  solution placed in the cervical cuff and the suction was placed to wall suction.  Bleeding slowed.  Estimated blood loss 2000 mL total.  Ultimately, the pediatricians team made aware that the left humerus was fractured during the delivery.  This was  translated to the parents.   VAI D: 01/01/2022 6:42:27 pm T: 01/01/2022 10:11:00 pm  JOB: [id number]/ [id number]

## 2022-01-01 NOTE — Progress Notes (Addendum)
Delivery Note  First Stage: Labor onset: 01/01/2022 @ 0800 Augmentation : cytotec x1, pitocin, AROM Analgesia /Anesthesia intrapartum: epidural SROM at 2000, AROM of forebag at 0923  Second Stage: Complete dilation at 1635 Onset of pushing at 1639 FHR second stage Category II, recurrent variable decelerations  She delivered fetal head easily at 1717 in OA, then restitution to LOA and turtling noted. Performed McRoberts, then suprapubic pressure, then attempted Rubins, then attempted delivery of posterior shoulder. At 1718 requested Dr. Ouida Sills emergently called to bedside for shoulder dystocia. Using [place], rotated infant from LOA to OA. Attempted Rubins then delivery of posterior arm again. Considered episiotomy but not necessary since able to fit entire hand into vagina to perform maneuvers. Woodscrew maneuver again to rotate back to LOA. Rotated patient to hands and knees for Gaskins maneuver. Attempted Rubins then delivery of posterior arm. Dr. Ouida Sills at bedside at 1722 and managed shoulder dystocia. Infant delivered at 1723 by Dr. Ouida Sills. See Dr. Tonette Bihari delivery note for details.  Cord blood sample collected   Arterial/venous cord blood sample collected. Cord blood gases arterial 7.3, venous 7.3  Third Stage: Placenta delivered Schultz intact with 3 VC @ 1741 Placenta disposition: discarded Uterine tone boggy / bleeding heavy and gushing, clots present.  Pitocin bolus started, 881mg Cytotec placed rectally. Manual sweep x3 removed blood clots. Uterus then firm after manual sweep. TXA administered. After 10 minutes, bleeding resumed with heavy trickle. Foley catheter placed and urine drained. Manual sweep again and unable to reach fundus d/t maternal habitus and anatomy. Requested Dr. SOuida Sillsto come to bedside for manual sweep and to place JWhitetail Jada placed by Dr. SOuida Sillsand put to suction, uterus confirmed firm and bleeding minimal after  placement.  2nd degree laceration identified  Anesthesia for repair: epidural Repair 2-0 Vicryl Est. Blood Loss (mL): 17063m CBC in 2 hours. Leave Jada in place for 2-3 hours and monitor bleeding. Pain medication to manage pain from manual sweeps and Jada.  Complications: shoulder dystocia x 6 minutes, postpartum hemorrhage  Mom to  continue on L&D for magnesium infusion .  Baby to NICU.  Newborn: Birth Weight: 8lb 5.7oz  Apgar Scores: 0, 8 Feeding planned: bottle feeding

## 2022-01-01 NOTE — Progress Notes (Signed)
S: She denies dizziness, states she overall feels well. Infant is back at bedside being held by grandmother.  O: Jada suction turned off, scant bleeding, uterus firm.  Hgb 2 hours post hemorrhage 11.2 -> 9.1 WBCs 2hrs post hemorrhage 8.7 -> 19.4 Urine output: 731m since 1930     01/01/2022   10:20 PM 01/01/2022   10:05 PM 01/01/2022    9:50 PM  Vitals with BMI  Systolic 96 [phone] Diastolic 54 58 62  Pulse 81 73 81  Afebrile  A: 36year old G3P1203 5 hours post shoulder dystocia and postpartum hemorrhage with Jada device in place  P:  - Jada device removed, bleeding remained scant, uterus firm. - Continue hourly I&O - Repeat CBC at midnight - Continue magnesium until 12-24hrs postpartum depending on urine output and vital signs.  DGertie Fey CNM 01/01/2022 10:48 PM

## 2022-01-02 LAB — CBC
HCT: 25.9 % — ABNORMAL LOW (ref 36.0–46.0)
HCT: 26 % — ABNORMAL LOW (ref 36.0–46.0)
Hemoglobin: 8.1 g/dL — ABNORMAL LOW (ref 12.0–15.0)
Hemoglobin: 8.2 g/dL — ABNORMAL LOW (ref 12.0–15.0)
MCH: 24.5 pg — ABNORMAL LOW (ref 26.0–34.0)
MCH: 24.8 pg — ABNORMAL LOW (ref 26.0–34.0)
MCHC: 31.3 g/dL (ref 30.0–36.0)
MCHC: 31.5 g/dL (ref 30.0–36.0)
MCV: 78.5 fL — ABNORMAL LOW (ref 80.0–100.0)
MCV: 78.5 fL — ABNORMAL LOW (ref 80.0–100.0)
Platelets: 252 10*3/uL (ref 150–400)
Platelets: 256 10*3/uL (ref 150–400)
RBC: 3.3 MIL/uL — ABNORMAL LOW (ref 3.87–5.11)
RBC: 3.31 MIL/uL — ABNORMAL LOW (ref 3.87–5.11)
RDW: 16.1 % — ABNORMAL HIGH (ref 11.5–15.5)
RDW: 16.3 % — ABNORMAL HIGH (ref 11.5–15.5)
WBC: 14.4 10*3/uL — ABNORMAL HIGH (ref 4.0–10.5)
WBC: 18.8 10*3/uL — ABNORMAL HIGH (ref 4.0–10.5)
nRBC: 0 % (ref 0.0–0.2)
nRBC: 0 % (ref 0.0–0.2)

## 2022-01-02 LAB — GLUCOSE, CAPILLARY: Glucose-Capillary: 95 mg/dL (ref 70–99)

## 2022-01-02 MED ORDER — BUPRENORPHINE HCL-NALOXONE HCL 8-2 MG SL SUBL
1.0000 | SUBLINGUAL_TABLET | Freq: Every day | SUBLINGUAL | Status: DC
  Administered 2022-01-03: 1 via SUBLINGUAL
  Filled 2022-01-02 (×2): qty 1

## 2022-01-02 MED ORDER — BUPRENORPHINE HCL-NALOXONE HCL 8-2 MG SL SUBL
1.0000 | SUBLINGUAL_TABLET | Freq: Every day | SUBLINGUAL | Status: DC
  Filled 2022-01-02: qty 1

## 2022-01-02 NOTE — Progress Notes (Signed)
Postpartum Day  1  Subjective: no complaints, tolerating PO, and + flatus  Doing well, no concerns. Pain managed with PO meds, tolerating regular diet, and foley remains in situ.   No fever/chills, chest pain, shortness of breath, nausea/vomiting, or leg pain. No nipple or breast pain. No headache, visual changes, or RUQ/epigastric pain.  Objective: BP 124/74   Pulse 85   Temp 98.5 F (36.9 C) (Oral)   Resp 17   Ht '5\' 4"'$  (1.626 m)   Wt 132.9 kg   LMP 04/21/2021   SpO2 97%   Breastfeeding Unknown   BMI 50.29 kg/m   Vitals:   01/02/22 0205 01/02/22 0234 01/02/22 0330 01/02/22 0335  BP: 119/61 122/64 136/74 136/79   01/02/22 0405 01/02/22 0435 01/02/22 0505 01/02/22 0535  BP: 124/68 118/63 117/60 117/69   01/02/22 0607 01/02/22 0704 01/02/22 0735 01/02/22 0831  BP: (!) 110/56 126/73 139/79 124/74      Physical Exam:  General: alert, cooperative, and no distress Breasts: soft/nontender CV: RRR Pulm: nl effort, CTABL Abdomen: soft, non-tender, active bowel sounds Uterine Fundus: firm Perineum: minimal edema, repair well approximated Lochia: appropriate DVT Evaluation: No evidence of DVT seen on physical exam.  Recent Labs    01/02/22 0028 01/02/22 0728  HGB 8.2* 8.1*  HCT 26.0* 25.9*  WBC 18.8* 14.4*  PLT 252 256    Assessment/Plan: 36 y.o. [id number] postpartum day # 1  -Continue routine postpartum care -Encouraged snug fitting bra, cold application, Tylenol PRN, and cabbage leaves for engorgement for formula feeding  -Acute blood loss anemia - hemodynamically stable and asymptomatic; start PO ferrous sulfate BID with stool softeners  -Receiving one dose of IV venofer now  -Vaginal bleeding small to moderate, 1 small clot expressed this morning.  -BP's WNL postpartum -Receiving mag sulfate infusion - plan to d/c tonight at 1700 -Infant remains in SCN - has been able to come to room intermittently to visit with mom     Disposition: Continue inpatient  postpartum care    LOS: 1 day   Minda Meo, CNM 01/02/2022, 9:22 AM   ----- Drinda Butts  Certified Nurse Midwife [facility]

## 2022-01-02 NOTE — Anesthesia Postprocedure Evaluation (Signed)
Anesthesia Post Note  Patient: Suzanne Moses  Procedure(s) Performed: AN AD [place]  Patient location during evaluation: Mother Baby Anesthesia Type: Epidural Level of consciousness: awake and alert Pain management: pain level controlled Vital Signs Assessment: post-procedure vital signs reviewed and stable Respiratory status: spontaneous breathing, nonlabored ventilation and respiratory function stable Cardiovascular status: stable Postop Assessment: no headache, no backache and epidural receding Anesthetic complications: no   No notable events documented.   Last Vitals:  Vitals:   01/02/22 0705 01/02/22 0735  BP:  139/79  Pulse:  80  Resp:  18  Temp:  36.9 C  SpO2: 98% 97%    Last Pain:  Vitals:   01/02/22 0735  TempSrc: Oral  PainSc: 0-No pain                 Ngoc Daughtridge Lily Peer

## 2022-01-02 NOTE — Lactation Note (Signed)
This note was copied from a baby's chart. Lactation Consultation Note  Patient Name: Suzanne Moses EPPIR'J Date: 01/02/2022 Reason for consult: L&D Initial assessment;Late-preterm 34-36.6wks;Other (Comment) (Mom on Mag2+) Age:34 hours  Lactation called by Transition for pump set-up, and mom given the go-ahead to breast feed/provide EBM.  Maternal Data Has patient been taught Hand Expression?: Yes Does the patient have breastfeeding experience prior to this delivery?: Yes How long did the patient breastfeed?: 1 month  P3, vag delivery with shoulder dystocia, and mom had PPH. Mom did have positive urine screen on admission for opioids, unsure of source. Neo has given the go-ahead for baby to breast feed and/or be given EBM.  Feeding Mother's Current Feeding Choice: Breast Milk and Formula Nipple Type: Slow - flow  Pump initially set-up at bedside by Transition RN. Mom actively pumping upon entry. Mom notes that pump feels "pinchy"; switched from 24 flange to 27, improved feeling, also dialed back the suction just a little.  Education given on turning on pump, utilization of the initiation phase and pre-term settings. Instructed to pump every 3 hours, guidance given for how to give colostrum from flange to baby via gloved finger.  Instructions given to dad on cleaning the pump parts/pieces.  LATCH Score  Lactation Tools Discussed/Used Tools: Pump Breast pump type: Double-Electric Breast Pump Pump Education: Setup, frequency, and cleaning Reason for Pumping: EBM for baby Pumping frequency: q 3 hours  Interventions Interventions: DEBP  Discharge [place] Program: Yes  Consult Status Consult Status: Follow-up from L&D    Suzanne Moses 01/02/2022, 1:50 PM

## 2022-01-02 NOTE — Plan of Care (Signed)
  Problem: Education: Goal: Knowledge of General Education information will improve Description: Including pain rating scale, medication(s)/side effects and non-pharmacologic comfort measures Outcome: Progressing   Problem: Health Behavior/Discharge Planning: Goal: Ability to manage health-related needs will improve Outcome: Progressing   Problem: Clinical Measurements: Goal: Ability to maintain clinical measurements within normal limits will improve Outcome: Progressing Goal: Will remain free from infection Outcome: Progressing Goal: Diagnostic test results will improve Outcome: Progressing Goal: Respiratory complications will improve Outcome: Progressing Goal: Cardiovascular complication will be avoided Outcome: Progressing   Problem: Activity: Goal: Risk for activity intolerance will decrease Outcome: Progressing   Problem: Nutrition: Goal: Adequate nutrition will be maintained Outcome: Progressing   Problem: Coping: Goal: Level of anxiety will decrease Outcome: Progressing   Problem: Elimination: Goal: Will not experience complications related to bowel motility Outcome: Progressing Goal: Will not experience complications related to urinary retention Outcome: Progressing   Problem: Pain Managment: Goal: General experience of comfort will improve Outcome: Progressing   Problem: Safety: Goal: Ability to remain free from injury will improve Outcome: Progressing   Problem: Skin Integrity: Goal: Risk for impaired skin integrity will decrease Outcome: Progressing   Problem: Education: Goal: Ability to describe self-care measures that may prevent or decrease complications (Diabetes Survival Skills Education) will improve Outcome: Progressing Goal: Individualized Educational Video(s) Outcome: Progressing   Problem: Coping: Goal: Ability to adjust to condition or change in health will improve Outcome: Progressing   Problem: Fluid Volume: Goal: Ability to  maintain a balanced intake and output will improve Outcome: Progressing   Problem: Health Behavior/Discharge Planning: Goal: Ability to identify and utilize available resources and services will improve Outcome: Progressing Goal: Ability to manage health-related needs will improve Outcome: Progressing   Problem: Metabolic: Goal: Ability to maintain appropriate glucose levels will improve Outcome: Progressing   Problem: Nutritional: Goal: Maintenance of adequate nutrition will improve Outcome: Progressing Goal: Progress toward achieving an optimal weight will improve Outcome: Progressing   Problem: Skin Integrity: Goal: Risk for impaired skin integrity will decrease Outcome: Progressing   Problem: Tissue Perfusion: Goal: Adequacy of tissue perfusion will improve Outcome: Progressing   Problem: Education: Goal: Knowledge of disease or condition will improve Outcome: Progressing Goal: Knowledge of the prescribed therapeutic regimen will improve Outcome: Progressing   Problem: Fluid Volume: Goal: Peripheral tissue perfusion will improve Outcome: Progressing   Problem: Clinical Measurements: Goal: Complications related to disease process, condition or treatment will be avoided or minimized Outcome: Progressing   Problem: Education: Goal: Knowledge of condition will improve Outcome: Progressing Goal: Individualized Educational Video(s) Outcome: Progressing Goal: Individualized Newborn Educational Video(s) Outcome: Progressing   Problem: Activity: Goal: Will verbalize the importance of balancing activity with adequate rest periods Outcome: Progressing Goal: Ability to tolerate increased activity will improve Outcome: Progressing   Problem: Coping: Goal: Ability to identify and utilize available resources and services will improve Outcome: Progressing   Problem: Life Cycle: Goal: Chance of risk for complications during the postpartum period will decrease Outcome:  Progressing   Problem: Role Relationship: Goal: Ability to demonstrate positive interaction with newborn will improve Outcome: Progressing   Problem: Skin Integrity: Goal: Demonstration of wound healing without infection will improve Outcome: Progressing

## 2022-01-03 ENCOUNTER — Inpatient Hospital Stay: Payer: Medicare Other

## 2022-01-03 MED ORDER — ACETAMINOPHEN 325 MG PO TABS: 650.0000 mg | ORAL_TABLET | ORAL | Status: AC | PRN

## 2022-01-03 MED ORDER — NIFEDIPINE ER 30 MG PO TB24: 30.0000 mg | ORAL_TABLET | Freq: Every day | ORAL | 0 refills | Status: DC

## 2022-01-03 MED ORDER — COCONUT OIL OIL: 1.0000 | TOPICAL_OIL | 0 refills | Status: DC | PRN

## 2022-01-03 MED ORDER — BENZOCAINE-MENTHOL 20-0.5 % EX AERO: 1.0000 | INHALATION_SPRAY | CUTANEOUS | Status: AC | PRN

## 2022-01-03 MED ORDER — FUROSEMIDE 20 MG PO TABS
20.0000 mg | ORAL_TABLET | Freq: Every day | ORAL | Status: DC
  Administered 2022-01-03: 20 mg via ORAL
  Filled 2022-01-03: qty 1

## 2022-01-03 MED ORDER — FUROSEMIDE 20 MG PO TABS: 20.0000 mg | ORAL_TABLET | Freq: Every day | ORAL | 0 refills | Status: DC

## 2022-01-03 MED ORDER — NIFEDIPINE ER OSMOTIC RELEASE 30 MG PO TB24
30.0000 mg | ORAL_TABLET | Freq: Every day | ORAL | Status: DC
  Administered 2022-01-03: 30 mg via ORAL
  Filled 2022-01-03: qty 1

## 2022-01-03 MED ORDER — SENNOSIDES-DOCUSATE SODIUM 8.6-50 MG PO TABS: 2.0000 | ORAL_TABLET | Freq: Every day | ORAL | Status: DC

## 2022-01-03 MED ORDER — WITCH HAZEL-GLYCERIN EX PADS: 1.0000 | MEDICATED_PAD | CUTANEOUS | 12 refills | Status: DC | PRN

## 2022-01-03 MED ORDER — DIBUCAINE (PERIANAL) 1 % EX OINT: 1.0000 | TOPICAL_OINTMENT | CUTANEOUS | Status: DC | PRN

## 2022-01-03 MED ORDER — SIMETHICONE 80 MG PO CHEW: 80.0000 mg | CHEWABLE_TABLET | ORAL | 0 refills | Status: DC | PRN

## 2022-01-03 MED ORDER — IBUPROFEN 600 MG PO TABS: 600.0000 mg | ORAL_TABLET | Freq: Four times a day (QID) | ORAL | 0 refills | Status: DC

## 2022-01-03 NOTE — Progress Notes (Signed)
RN to room for rounds at [place]. Pt reporting pain 8/10 on both sides of back over the lower parts of the lungs. Pt also feels like she is noticeably wheezing. When auscultating lungs, no change noticed. Pt reports increased pain with activity. Denies SOB or chest pain but increased urge to cough. AArdelle Lesches notified. See new orders

## 2022-01-03 NOTE — Progress Notes (Signed)
Postpartum Day  1  Subjective: Reports feeling the urge to cough but can't get a deep enough breath to have an effective cough.  Feels like she's having wheezing.  Denies chest pain, No fever/chills, no continued shortness of breath, nausea/vomiting, or leg pain. No nipple or breast pain. No headache, visual changes, or RUQ/epigastric pain.  Objective: BP (!) 143/76   Pulse 97   Temp 98.3 F (36.8 C) (Oral)   Resp 20   Ht '5\' 4"'$  (1.626 m)   Wt 132.9 kg   LMP 04/21/2021   SpO2 98%   Breastfeeding Unknown   BMI 50.29 kg/m    Physical Exam:  General: alert, cooperative, and no distress Breasts: soft/nontender CV: RRR Pulm: nl effort, rhonchi ausculted bilaterally in both upper and lower lobes  Extremities: 2+ bilateral pitting edema in LE  Abdomen: soft, non-tender, active bowel sounds Uterine Fundus: firm Lochia: appropriate DVT Evaluation: No evidence of DVT seen on physical exam.  Recent Labs    01/02/22 0028 01/02/22 0728  HGB 8.2* 8.1*  HCT 26.0* 25.9*  WBC 18.8* 14.4*  PLT 252 256    Assessment/Plan: 36 y.o. [id number] postpartumum day # 1  -stat chest X-ray completed - normal exam  -Lasix '20mg'$  PO ordered  -Incentive spirometer ordered  -Continuous pulse ox  -Vital signs reviewed - no tachycardia or fever noted, bp's mild range, pulse ox WNL  -Will start procardia in AM    LOS: 2 days   Minda Meo, CNM 01/03/2022, 2:18 AM   ----- Drinda Butts  Certified Nurse Midwife [facility]

## 2022-01-03 NOTE — Progress Notes (Signed)
Patient discharged, will stay rooming in with infant for ESC.  Discharge instructions, when to follow up, and prescriptions reviewed with patient.  Patient verbalized understanding.
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
## 2022-01-06 ENCOUNTER — Ambulatory Visit: Payer: Self-pay

## 2022-01-06 NOTE — Lactation Note (Signed)
This note was copied from a baby's chart. Lactation Consultation Note  Patient Name: Suzanne Moses WCHEN'I Date: 01/06/2022 Reason for consult: Follow-up assessment;RN request;Mother's request;Late-preterm 34-36.6wks Age:34 days  Maternal Data See initial consult note 01/02/22 Mom has been exclusively formula feeding. Baby DOL 5 has been taking volumes of 35-47 ml's. Mom reports she has pumped today and expressed 10 ml's. She has noticed milk leakage from her breasts. She had been inconsistently pumping but has begun increasing the frequency of pumping. Today, mom and baby ready for discharge. Mom verbalized today she would like to breastfeed. Also, mom verbalized to [place] and myself she stopped taking suboxone last Sunday. (See MD note from today) Per MD mom can give her breastmilk.  How long did the patient breastfeed?: Per mom she has 2 children she provided some pumped milk for the first 2 weeks. Mom used a hand pump.  Feeding Mother's Current Feeding Choice: Breast Milk and Formula Mom is completely packed up and she and baby are ready for discharge now. Discussed with mom her milk volume is currently low/ delayed onset and her baby who is LPT has been exclusively formula fed for 5 days. A safe feeding plan at this point is for mom to goal to pump 8 times/24 hours to increase her milk supply and for her to offer any her expressed breastmilk by bottle and follow with the formula supplement. Recommended mom follow-up with [facility] outpatient lactation and/or WIC. to assist with breastfeeding once her supply improves.  Interventions Interventions: DEBP;Education;Breast feeding basics reviewed Reviewed characteristics of late preterm baby when breastfeeding, how the body knows to make milk and the importance of consistent pumping to establish mom's milk supply.  Discharge Discharge Education: Engorgement and breast care;Warning signs for feeding baby (Mom aware she can call [facility] lactation if  she has additional questions or would like to be seen for breastfeeding outpatient. Per mom she is aware she can also get assistance from [facility]) Pump: Manual (Mom will follow-up with [facility] for an electric loaner pump.) WIC Program: Yes  Consult Status Consult Status: Complete  Update provided to care nurse.  Jonna Tighe Gitto 01/06/2022, 11:10 AM

## 2022-02-05 ENCOUNTER — Ambulatory Visit: Payer: Medicare Other | Admitting: Family Medicine

## 2022-02-08 ENCOUNTER — Ambulatory Visit (INDEPENDENT_AMBULATORY_CARE_PROVIDER_SITE_OTHER): Payer: Medicare Other | Admitting: Physician Assistant

## 2022-02-08 ENCOUNTER — Encounter: Payer: Self-pay | Admitting: Physician Assistant

## 2022-02-08 VITALS — BP 141/87 | HR 94 | Resp 16 | Ht 63.0 in | Wt 294.0 lb

## 2022-02-08 DIAGNOSIS — Z8759 Personal history of other complications of pregnancy, childbirth and the puerperium: Secondary | ICD-10-CM | POA: Insufficient documentation

## 2022-02-08 DIAGNOSIS — H9202 Otalgia, left ear: Secondary | ICD-10-CM | POA: Diagnosis not present

## 2022-02-08 DIAGNOSIS — I10 Essential (primary) hypertension: Secondary | ICD-10-CM | POA: Diagnosis not present

## 2022-02-08 LAB — POCT URINALYSIS DIPSTICK
Bilirubin, UA: NEGATIVE
Blood, UA: NEGATIVE
Glucose, UA: NEGATIVE
Ketones, UA: NEGATIVE
Leukocytes, UA: NEGATIVE
Nitrite, UA: NEGATIVE
Protein, UA: POSITIVE — AB
Spec Grav, UA: 1.01 (ref 1.010–1.025)
Urobilinogen, UA: 0.2 E.U./dL
pH, UA: 6.5 (ref 5.0–8.0)

## 2022-02-08 MED ORDER — NIFEDIPINE ER 30 MG PO TB24: 30.0000 mg | ORAL_TABLET | Freq: Every day | ORAL | 0 refills | Status: DC

## 2022-02-08 NOTE — Progress Notes (Signed)
I,April Miller,acting as a Education administrator for [employer], PA-C.,have documented all relevant documentation on the behalf of Suzanne Kirschner, PA-C,as directed by  Suzanne Kirschner, PA-C while in the presence of Suzanne Kirschner, PA-C.    Established patient visit   Patient: Suzanne Moses   DOB: 09-Mar-1986   36 y.o. Female  MRN: [id number] Visit Date: 02/08/2022  Today's healthcare provider: Mikey Kirschner, PA-C   Chief Complaint  Patient presents with   Edema   Ear Pain    Left   Subjective    HPI  Pt is postpartum 4 weeks, reports concerns over leg edema, concerns over vaginal tear, left ear pain. She reports she stopped seeing her OB 2 months before birth, and was pre-ecclampic in the hospital.  She was d/c on HTN meds, but stopped taking them. Denies headache, SOB, vision changes.  Reports vaginal itching ,but denies discharge, pain, bleeding.   She reports hemorrhaging after birth and having a 4th degree vaginal tear.   She also reports left ear pain x 1-2 weeks. Sounded like she was underwater,  but symptoms have resolved. Denies fevers.  Medications: Outpatient Medications Prior to Visit  Medication Sig   acetaminophen (TYLENOL) 325 MG tablet Take 2 tablets (650 mg total) by mouth every 4 (four) hours as needed (for pain scale < 4).   ALPRAZolam (XANAX) 0.5 MG tablet Take 0.5 mg by mouth 4 (four) times daily as needed.   benzocaine-Menthol (DERMOPLAST) 20-0.5 % AERO Apply 1 Application topically as needed for irritation (perineal discomfort).   Buprenorphine HCl-Naloxone HCl 8-2 MG FILM Place under the tongue.   coconut oil OIL Apply 1 Application topically as needed.   Prenatal Vit-Fe Fumarate-FA (MULTIVITAMIN-PRENATAL) 27-0.8 MG TABS tablet Take 1 tablet by mouth daily at 12 noon.   witch hazel-glycerin (TUCKS) pad Apply 1 Application topically as needed for hemorrhoids.   ziprasidone (GEODON) 80 MG capsule Take 80 mg by mouth 2 (two) times daily with a meal.    [DISCONTINUED] ALPRAZolam (XANAX) 0.25 MG tablet Take 0.25 mg by mouth 2 (two) times daily as needed.   amphetamine-dextroamphetamine (ADDERALL) 30 MG tablet  (Patient not taking: Reported on 05/22/2021)   furosemide (LASIX) 20 MG tablet Take 1 tablet (20 mg total) by mouth daily for 4 days.   ibuprofen (ADVIL) 600 MG tablet Take 1 tablet (600 mg total) by mouth every 6 (six) hours. (Patient not taking: Reported on 02/08/2022)   [DISCONTINUED] dibucaine (NUPERCAINAL) 1 % OINT Place 1 Application rectally as needed for hemorrhoids. (Patient not taking: Reported on 02/08/2022)   [DISCONTINUED] NIFEdipine (ADALAT CC) 30 MG 24 hr tablet Take 1 tablet (30 mg total) by mouth daily.   [DISCONTINUED] senna-docusate (SENOKOT-S) 8.6-50 MG tablet Take 2 tablets by mouth daily. (Patient not taking: Reported on 02/08/2022)   [DISCONTINUED] simethicone (MYLICON) 80 MG chewable tablet Chew 1 tablet (80 mg total) by mouth as needed for flatulence. (Patient not taking: Reported on 02/08/2022)   No facility-administered medications prior to visit.    Review of Systems  Constitutional:  Negative for fatigue and fever.  Respiratory:  Negative for cough and shortness of breath.   Cardiovascular:  Positive for leg swelling. Negative for chest pain.  Gastrointestinal:  Negative for abdominal pain.  Neurological:  Negative for dizziness and headaches.      Objective    BP (!) 141/87 (BP Location: Left Arm, Patient Position: Sitting, Cuff Size: Large)   Pulse 94   Resp 16   Ht  $'5\' 3"'t$  (1.6 m)   Wt 294 lb (133.4 kg)   LMP 04/21/2021   SpO2 100%   BMI 52.08 kg/m    Physical Exam Vitals reviewed.  Constitutional:      Appearance: She is not ill-appearing.  HENT:     Head: Normocephalic.     Left Ear: Tympanic membrane normal.  Eyes:     Conjunctiva/sclera: Conjunctivae normal.  Cardiovascular:     Rate and Rhythm: Normal rate.  Pulmonary:     Effort: Pulmonary effort is normal. No respiratory distress.   Genitourinary:    Comments: No baseline to compare to, unsure if she has torn w/ prior births; no visible infections, erythema, discharge. Neurological:     General: No focal deficit present.     Mental Status: She is alert and oriented to person, place, and time.  Psychiatric:        Mood and Affect: Mood normal.        Behavior: Behavior normal.     Results for orders placed or performed in visit on 02/08/22  POCT Urinalysis Dipstick  Result Value Ref Range   Color, UA Yellow    Clarity, UA Clear    Glucose, UA Negative Negative   Bilirubin, UA Negative    Ketones, UA Negative    Spec Grav, UA 1.010 1.010 - 1.025   Blood, UA Negative    pH, UA 6.5 5.0 - 8.0   Protein, UA Positive (A) Negative   Urobilinogen, UA 0.2 0.2 or 1.0 E.U./dL   Nitrite, UA Negative    Leukocytes, UA Negative Negative    Assessment & Plan     Problem List Items Addressed This Visit       Cardiovascular and Mediastinum   Hypertension - Primary    Advised pt to restart nifedipine 30 mg. UA with 1 + protein.  No headaches/ vision changes.  Ordered cmp/cbc Strict ED precautions given, pt is understanding. Discussed case w/ MD Dr Quentin Cornwall who agrees with treatment plan F/u 1 week      Relevant Medications   NIFEdipine (ADALAT CC) 30 MG 24 hr tablet   Other Relevant Orders   POCT Urinalysis Dipstick (Completed)   Comprehensive Metabolic Panel (CMET)   CBC w/Diff/Platelet   Ambulatory referral to Obstetrics / Gynecology     Genitourinary   Vaginal tear resulting from childbirth    No bleeding, rash, discharge today.  Ref to ob      Relevant Orders   Ambulatory referral to Obstetrics / Gynecology     Other   History of pre-eclampsia   Relevant Orders   Ambulatory referral to Obstetrics / Gynecology   Left ear pain    No visible signs of infection, or serous om        Return in about 1 week (around 02/15/2022) for hypertension.      I, Suzanne Kirschner, PA-C have reviewed all  documentation for this visit. The documentation on  02/08/2022 for the exam, diagnosis, procedures, and orders are all accurate and complete.  Suzanne Kirschner, PA-C [facility] [address] [place], [place], [address] Office: [phone] Fax: [place]

## 2022-02-08 NOTE — Assessment & Plan Note (Signed)
No visible signs of infection, or serous om

## 2022-02-08 NOTE — Assessment & Plan Note (Addendum)
Advised pt to restart nifedipine 30 mg. UA with 1 + protein.  No headaches/ vision changes.  Ordered cmp/cbc Strict ED precautions given, pt is understanding. Discussed case w/ MD Dr Quentin Cornwall who agrees with treatment plan F/u 1 week

## 2022-02-08 NOTE — Assessment & Plan Note (Signed)
No bleeding, rash, discharge today.  Ref to ob

## 2022-02-12 ENCOUNTER — Telehealth: Payer: Self-pay

## 2022-02-12 ENCOUNTER — Encounter: Payer: Medicare Other | Admitting: Certified Nurse Midwife

## 2022-02-12 NOTE — Telephone Encounter (Signed)
Patient was rescheduled for 03/12/22 with Dr. Hulan Fray

## 2022-02-12 NOTE — Telephone Encounter (Signed)
Pt calling; is not able to keep appt tomorrow; tried to reach the schedulers but everytime she pressed the button to talk to someone it dept repeating the message.  [phone]

## 2022-02-15 ENCOUNTER — Ambulatory Visit: Payer: Medicare Other | Admitting: Physician Assistant

## 2022-02-23 ENCOUNTER — Other Ambulatory Visit: Payer: Self-pay

## 2022-02-23 ENCOUNTER — Emergency Department: Payer: Medicare Other

## 2022-02-23 ENCOUNTER — Emergency Department
Admission: EM | Admit: 2022-02-23 | Discharge: 2022-02-23 | Disposition: A | Discharge status: Home / Self Care | Payer: Medicare Other | Attending: Emergency Medicine | Admitting: Emergency Medicine

## 2022-02-23 DIAGNOSIS — R600 Localized edema: Secondary | ICD-10-CM | POA: Insufficient documentation

## 2022-02-23 DIAGNOSIS — I10 Essential (primary) hypertension: Secondary | ICD-10-CM

## 2022-02-23 DIAGNOSIS — R609 Edema, unspecified: Secondary | ICD-10-CM

## 2022-02-23 DIAGNOSIS — D649 Anemia, unspecified: Secondary | ICD-10-CM | POA: Insufficient documentation

## 2022-02-23 DIAGNOSIS — R0602 Shortness of breath: Secondary | ICD-10-CM | POA: Diagnosis not present

## 2022-02-23 DIAGNOSIS — M7989 Other specified soft tissue disorders: Secondary | ICD-10-CM | POA: Diagnosis present

## 2022-02-23 LAB — COMPREHENSIVE METABOLIC PANEL WITH GFR
ALT: 18 U/L (ref 0–44)
AST: 21 U/L (ref 15–41)
Albumin: 3.9 g/dL (ref 3.5–5.0)
Alkaline Phosphatase: 75 U/L (ref 38–126)
Anion gap: 8 (ref 5–15)
BUN: 11 mg/dL (ref 6–20)
CO2: 24 mmol/L (ref 22–32)
Calcium: 8.8 mg/dL — ABNORMAL LOW (ref 8.9–10.3)
Chloride: 104 mmol/L (ref 98–111)
Creatinine, Ser: 0.64 mg/dL (ref 0.44–1.00)
GFR, Estimated: >60 mL/min
Glucose, Bld: 93 mg/dL (ref 70–99)
Potassium: 3.7 mmol/L (ref 3.5–5.1)
Sodium: 136 mmol/L (ref 135–145)
Total Bilirubin: 0.3 mg/dL (ref 0.3–1.2)
Total Protein: 7.6 g/dL (ref 6.5–8.1)

## 2022-02-23 LAB — CBC WITH DIFFERENTIAL/PLATELET
Abs Immature Granulocytes: 0.06 10*3/uL (ref 0.00–0.07)
Basophils Absolute: 0.1 10*3/uL (ref 0.0–0.1)
Basophils Relative: 1 %
Eosinophils Absolute: 0.3 10*3/uL (ref 0.0–0.5)
Eosinophils Relative: 3 %
HCT: 27.7 % — ABNORMAL LOW (ref 36.0–46.0)
Hemoglobin: 7.8 g/dL — ABNORMAL LOW (ref 12.0–15.0)
Immature Granulocytes: 1 %
Lymphocytes Relative: 31 %
Lymphs Abs: 3.1 10*3/uL (ref 0.7–4.0)
MCH: 21 pg — ABNORMAL LOW (ref 26.0–34.0)
MCHC: 28.2 g/dL — ABNORMAL LOW (ref 30.0–36.0)
MCV: 74.7 fL — ABNORMAL LOW (ref 80.0–100.0)
Monocytes Absolute: 0.7 10*3/uL (ref 0.1–1.0)
Monocytes Relative: 7 %
Neutro Abs: 5.7 10*3/uL (ref 1.7–7.7)
Neutrophils Relative %: 57 %
Platelets: 416 10*3/uL — ABNORMAL HIGH (ref 150–400)
RBC: 3.71 MIL/uL — ABNORMAL LOW (ref 3.87–5.11)
RDW: 19.2 % — ABNORMAL HIGH (ref 11.5–15.5)
WBC: 9.9 10*3/uL (ref 4.0–10.5)
nRBC: 0 % (ref 0.0–0.2)

## 2022-02-23 LAB — BRAIN NATRIURETIC PEPTIDE: B Natriuretic Peptide: 77.4 pg/mL (ref 0.0–100.0)

## 2022-02-23 MED ORDER — FUROSEMIDE 20 MG PO TABS: 20.0000 mg | ORAL_TABLET | Freq: Every day | ORAL | 0 refills | Status: DC

## 2022-02-23 MED ORDER — FERROUS SULFATE 325 (65 FE) MG PO TABS: 325.0000 mg | ORAL_TABLET | Freq: Two times a day (BID) | ORAL | 2 refills | Status: AC

## 2022-02-23 MED ORDER — NIFEDIPINE ER 30 MG PO TB24: 30.0000 mg | ORAL_TABLET | Freq: Every day | ORAL | 1 refills | Status: DC

## 2022-02-23 NOTE — ED Triage Notes (Signed)
Pt arrives with c/o bilateral lower extremity swelling that has gotten worse since giving birth 7 weeks ago. Pt endorses SOB and lower ABD swelling. Pt denies CP. Pt did have preeclampsia during the pregnancy.

## 2022-02-23 NOTE — ED Provider Notes (Signed)
[facility] Provider Note    Event Date/Time   First MD Initiated Contact with Patient 02/23/22 2303     (approximate)  History   Chief Complaint: Leg Swelling  HPI  Suzanne Moses is a 36 y.o. female with a past medical history of bipolar, anxiety, status post vaginal delivery 2 months ago presents to the emergency department for continued lower extremity swelling.  According to the patient since her delivery 2 months ago she has continued to have lower extremity edema, she decided to come back in today to get evaluated.  Patient denies any shortness of breath denies any chest pain.  Denies any changes in lower extremity edema but decided to get it checked out due to persistent lower extremity edema.  Patient states she did have preeclampsia in the pregnancy was on nifedipine during the last bit of pregnancy and shortly after her pregnancy but has since stopped that medication.  Physical Exam   Triage Vital Signs: ED Triage Vitals  Enc Vitals Group     BP 02/23/22 1934 (!) 143/94     Pulse Rate 02/23/22 1934 (!) 104     Resp 02/23/22 1934 18     Temp 02/23/22 1934 98.5 F (36.9 C)     Temp Source 02/23/22 2309 Oral     SpO2 02/23/22 1934 100 %     Weight 02/23/22 1934 296 lb (134.3 kg)     Height 02/23/22 1934 '5\' 3"'$  (1.6 m)     Head Circumference --      Peak Flow --      Pain Score 02/23/22 1934 7     Pain Loc --      Pain Edu? --      Excl. in [place]? --     Most recent vital signs: Vitals:   02/23/22 1934 02/23/22 2309  BP: (!) 143/94 (!) 146/81  Pulse: (!) 104 94  Resp: 18 (!) 21  Temp: 98.5 F (36.9 C) 98.3 F (36.8 C)  SpO2: 100% 100%    General: Awake, no distress.  CV:  Good peripheral perfusion.  Regular rate and rhythm  Resp:  Normal effort.  Equal breath sounds bilaterally.  Abd:  No distention.  Soft, nontender.  No rebound or guarding. Other:  2+ lower extremity edema bilaterally.  Nontender.  No erythema.   ED Results /  Procedures / Treatments   EKG  EKG viewed and interpreted by myself shows normal sinus rhythm at 91 bpm with a narrow QRS, normal axis, normal intervals, no concerning ST changes.  Reassuring EKG.  RADIOLOGY  I have reviewed and interpreted the chest x-ray images.  No consolidation or pulmonary edema seen on my evaluation. Radiology has read the x-ray as negative.   MEDICATIONS ORDERED IN ED: Medications - No data to display   IMPRESSION / MDM / [place] / ED COURSE  I reviewed the triage vital signs and the nursing notes.  Patient's presentation is most consistent with acute presentation with potential threat to life or bodily function.  Patient presents emergency department for continued lower extremity edema.  Patient does have 2+ lower extremity edema bilaterally which she states has been present since her delivery 2 months ago.  Denies any shortness of breath no chest pain.  Patient came today due to persistent lower extremity edema but denies any worsening of lower extremity edema.  Denies any dyspnea on exertion or shortness of breath.  Patient's work-up shows a reassuring EKG, normal/reassuring  chest x-ray with no signs of pulmonary edema.  Patient's lab work however does show a low hemoglobin which has been essentially unchanged since her delivery in September.  MCV is also low.  Patient just finished her menstrual cycle.  However denies any history of heavy menstrual cycles, typically last 3 days per patient.  I reviewed the patient's labs and chart and prior to her pregnancy her hemoglobin was closer to a normal range typically around 12.  Chemistry was reassuring.  I do believe the patient's lower extreme edema could possibly be related to several issues 1 of which being low hemoglobin resulting in high-output heart failure, given the low MCV I believe the patient would benefit from iron supplements but I also believe the patient should follow-up with hematology at this  point as she may require more aggressive therapy such as iron infusions do not believe the patient requires a blood transfusion at this point.  Patient is also hypertensive blood pressure around 145-150 in the emergency department.  Patient states similar readings after her pregnancy.  Patient is not breast-feeding with no plans to do so.  I do believe a low-dose nifedipine would be of benefit to the patient for blood pressure regulation.  Discussed with the patient starting this medication but also checking her blood pressure several times over the next week or so and discontinuing the medication if her blood pressure drops below 120.  Finally given the patient's lower extremity edema I do believe a short course of Lasix would be warranted as well 20 mg daily for the next 5 days.  We will have the patient follow-up with cardiology for an echocardiogram to rule out cardiomyopathy of pregnancy otherwise feel the low hemoglobin is more likely the culprit of the patient's continued lower extremity edema.  Patient will also follow-up with her doctor.  I discussed this at length with the patient she is agreeable to this plan of care.  Provided typical return precautions.  FINAL CLINICAL IMPRESSION(S) / ED DIAGNOSES   Lower extremity edema Anemia Hypertension  Rx / DC Orders   Ferrous sulfate Nifedipine Lasix  Note:  This document was prepared using Dragon voice recognition software and may include unintentional dictation errors.   Harvest Dark, MD 02/23/22 [phone]

## 2022-02-26 ENCOUNTER — Telehealth: Payer: Self-pay | Admitting: *Deleted

## 2022-02-26 NOTE — Telephone Encounter (Signed)
     Patient  visit on 02/23/2022  at [facility] was for swelling   Have you been able to follow up with your primary care physician? Patient telling the swelling is still there but has only taken a few .  of the pills so she will finish the medicine and then reach out to pcp if she is stilling sweating after completing the medication  The patient was  able to obtain any needed medicine or equipment.  Are there diet recommendations that you are having difficulty following?  Patient expresses understanding of discharge instructions and education provided has no other needs at this time  [facility] [address] , [place] [address] Email : [email or web address]

## 2022-03-07 ENCOUNTER — Ambulatory Visit: Payer: Self-pay

## 2022-03-07 NOTE — Telephone Encounter (Signed)
  Chief Complaint: Swollen lower legs, and abdomen Symptoms: legs are pink and warm. Pt had difficulty breathing the other day.  Frequency: 02/23/2022 Pertinent Negatives: Patient denies fever Disposition: '[x]'$ ED /'[]'$ Urgent Care (no appt availability in office) / '[]'$ Appointment(In office/virtual)/ '[]'$  [facility] [place] [facility]/ '[]'$ Home Care/ '[]'$ Refused Recommended Disposition /'[]'$ [facility]/ '[]'$  Follow-up with PCP Additional Notes: Pt had preeclampsia. Pt had PP bleed. Pt now has very swollen legs and lower abdomen. Pt has had 1 instance of difficulty breathing.    Reason for Disposition  [1] Difficulty breathing AND [2] new-onset or worsening  Answer Assessment - Initial Assessment Questions 1. ONSET: "When did the swelling start?" (e.g., minutes, hours, days)     Started 02/23/2022 2. LOCATION: "What part of the leg is swollen?"  "Are both legs swollen or just one leg?"     Both legs 3. SEVERITY: "How bad is the swelling?" (e.g., localized; mild, moderate, severe)   - LOCALIZED: Localized to one leg; small area of puffy or swollen skin (e.g., insect bite, skin irritation).   - MILD: Swelling limited to foot and ankle, pitting edema < 1/4 inch (6 mm) deep, rest  and elevation eliminate most or all swelling.   - MODERATE: Swelling of lower leg to knee, pitting edema > 1/4 inch (6 mm) deep, rest and elevation only partially reduce swelling.   - SEVERE: Swelling extends above knee, facial or hand swelling present.     moderate 4. REDNESS: "Does the swelling look red or infected?"     Hot feeling -pink 5. PAIN: "Is the swelling painful to touch?" If Yes, ask: "How painful is it?"   (Scale 1-10; mild, moderate or severe)     uncomfortable 6. FEVER: "Do you have a fever?" If Yes, ask: "What is it, how was it measured, and when did it start?"      no 7. MEDICAL HISTORY: "Do you have a history of blood clots, heart failure, kidney disease, liver failure, or preeclampsia?"      Preeclampsia 8. OTHER SYMPTOMS: "Do you have any other symptoms?" (e.g., chest pain, difficulty breathing)     Difficulty breathing - asthma 9. DELIVERY DATE: "When was your delivery date?" "Vaginal delivery or C-section?"     Vaginal -  Protocols used: Postpartum - Leg Swelling and Edema-A-AH

## 2022-03-08 ENCOUNTER — Encounter: Payer: Self-pay | Admitting: Oncology

## 2022-03-08 ENCOUNTER — Inpatient Hospital Stay: Payer: Medicare Other

## 2022-03-08 ENCOUNTER — Inpatient Hospital Stay: Payer: Medicare Other | Attending: Oncology | Admitting: Oncology

## 2022-03-08 ENCOUNTER — Other Ambulatory Visit: Payer: Self-pay

## 2022-03-08 VITALS — BP 150/91 | HR 96 | Temp 97.0°F | Resp 17 | Ht 64.0 in | Wt 291.0 lb

## 2022-03-08 DIAGNOSIS — M7989 Other specified soft tissue disorders: Secondary | ICD-10-CM | POA: Diagnosis not present

## 2022-03-08 DIAGNOSIS — D509 Iron deficiency anemia, unspecified: Secondary | ICD-10-CM | POA: Diagnosis not present

## 2022-03-08 DIAGNOSIS — E538 Deficiency of other specified B group vitamins: Secondary | ICD-10-CM | POA: Insufficient documentation

## 2022-03-08 DIAGNOSIS — D508 Other iron deficiency anemias: Secondary | ICD-10-CM

## 2022-03-08 LAB — CBC WITH DIFFERENTIAL/PLATELET
Abs Immature Granulocytes: 0.06 10*3/uL (ref 0.00–0.07)
Basophils Absolute: 0 10*3/uL (ref 0.0–0.1)
Basophils Relative: 0 %
Eosinophils Absolute: 0.2 10*3/uL (ref 0.0–0.5)
Eosinophils Relative: 2 %
HCT: 32.9 % — ABNORMAL LOW (ref 36.0–46.0)
Hemoglobin: 9.4 g/dL — ABNORMAL LOW (ref 12.0–15.0)
Immature Granulocytes: 1 %
Lymphocytes Relative: 23 %
Lymphs Abs: 2.4 10*3/uL (ref 0.7–4.0)
MCH: 22.7 pg — ABNORMAL LOW (ref 26.0–34.0)
MCHC: 28.6 g/dL — ABNORMAL LOW (ref 30.0–36.0)
MCV: 79.5 fL — ABNORMAL LOW (ref 80.0–100.0)
Monocytes Absolute: 0.7 10*3/uL (ref 0.1–1.0)
Monocytes Relative: 7 %
Neutro Abs: 7 10*3/uL (ref 1.7–7.7)
Neutrophils Relative %: 67 %
Platelets: 370 10*3/uL (ref 150–400)
RBC: 4.14 MIL/uL (ref 3.87–5.11)
RDW: 24.3 % — ABNORMAL HIGH (ref 11.5–15.5)
WBC: 10.5 10*3/uL (ref 4.0–10.5)
nRBC: 0 % (ref 0.0–0.2)

## 2022-03-08 LAB — COMPREHENSIVE METABOLIC PANEL
ALT: 15 U/L (ref 0–44)
AST: 18 U/L (ref 15–41)
Albumin: 4 g/dL (ref 3.5–5.0)
Alkaline Phosphatase: 73 U/L (ref 38–126)
Anion gap: 9 (ref 5–15)
BUN: 11 mg/dL (ref 6–20)
CO2: 25 mmol/L (ref 22–32)
Calcium: 8.7 mg/dL — ABNORMAL LOW (ref 8.9–10.3)
Chloride: 103 mmol/L (ref 98–111)
Creatinine, Ser: 0.52 mg/dL (ref 0.44–1.00)
GFR, Estimated: >60 mL/min (ref >60)
Glucose, Bld: 77 mg/dL (ref 70–99)
Potassium: 4 mmol/L (ref 3.5–5.1)
Sodium: 137 mmol/L (ref 135–145)
Total Bilirubin: 0.4 mg/dL (ref 0.3–1.2)
Total Protein: 7.5 g/dL (ref 6.5–8.1)

## 2022-03-08 LAB — SAMPLE TO BLOOD BANK

## 2022-03-08 LAB — RETIC PANEL
Immature Retic Fract: 31.6 % — ABNORMAL HIGH (ref 2.3–15.9)
RBC.: 4.18 MIL/uL (ref 3.87–5.11)
Retic Count, Absolute: 213.6 10*3/uL — ABNORMAL HIGH (ref 19.0–186.0)
Retic Ct Pct: 5.1 % — ABNORMAL HIGH (ref 0.4–3.1)
Reticulocyte Hemoglobin: 26.6 pg — ABNORMAL LOW (ref >27.9)

## 2022-03-08 NOTE — Progress Notes (Unsigned)
Patient here for initial oncology appointment,  concerns of leg swelling, palpitations, SOB, & headaches

## 2022-03-09 LAB — FOLATE: Folate: 5.8 ng/mL — ABNORMAL LOW (ref >5.9)

## 2022-03-09 LAB — FERRITIN: Ferritin: 12 ng/mL (ref 11–307)

## 2022-03-10 ENCOUNTER — Encounter: Payer: Self-pay | Admitting: Oncology

## 2022-03-10 DIAGNOSIS — D649 Anemia, unspecified: Secondary | ICD-10-CM | POA: Insufficient documentation

## 2022-03-10 DIAGNOSIS — M7989 Other specified soft tissue disorders: Secondary | ICD-10-CM | POA: Insufficient documentation

## 2022-03-10 DIAGNOSIS — D509 Iron deficiency anemia, unspecified: Secondary | ICD-10-CM | POA: Insufficient documentation

## 2022-03-10 DIAGNOSIS — E538 Deficiency of other specified B group vitamins: Secondary | ICD-10-CM | POA: Insufficient documentation

## 2022-03-10 MED ORDER — ASCORBIC ACID 500 MG PO TABS: 500.0000 mg | ORAL_TABLET | Freq: Every day | ORAL | 0 refills | Status: DC

## 2022-03-10 NOTE — Progress Notes (Signed)
Hematology/Oncology Consult note [phone] [phone]      Patient Care Team: Birdie Sons, MD as PCP - General (Family Medicine)   REFERRING PROVIDER: Birdie Sons, MD  CHIEF COMPLAINTS/REASON FOR VISIT:  Iron deficiency anemia  ASSESSMENT & PLAN:   Iron deficiency anemia Check CBC, iron TIBC ferritin. Today's lab results more consistent with iron deficiency anemia.  Since the start of oral iron supplementation, her hemoglobin has improved.  Still continuing ferrous sulfate 325 mg twice daily is a good option for her.  I also recommend to add vitamin C daily to increase absorption.  Folate deficiency Recommend patient to take prenatal vitamin.  Leg swelling Differential diagnosis includes lymphedema, CHF, vascular event insufficiency, DVT etc.  Check bilateral lower extremity ultrasound. BMP is negative.  Recommend patient to follow-up in echocardiogram in the future for further evaluation of heart function.  She also discussed with gynecologist  Orders Placed This Encounter  Procedures   US Venous Img Lower Bilateral    Standing Status:   Future    Standing Expiration Date:   03/08/2023    Order Specific Question:   Reason for Exam (SYMPTOM  OR DIAGNOSIS REQUIRED)    Answer:   Leg swelling    Order Specific Question:   Preferred imaging location?    Answer:   [facility]   CBC with Differential/Platelet    Standing Status:   Future    Number of Occurrences:   1    Standing Expiration Date:   03/09/2023   CBC with Differential/Platelet    Standing Status:   Future    Number of Occurrences:   1    Standing Expiration Date:   03/08/2023   Comprehensive metabolic panel    Standing Status:   Future    Number of Occurrences:   1    Standing Expiration Date:   03/08/2023   Folate    Standing Status:   Future    Number of Occurrences:   1    Standing Expiration Date:   03/09/2023   Ferritin    Standing Status:   Future    Number  of Occurrences:   1    Standing Expiration Date:   03/09/2023   Retic Panel    Standing Status:   Future    Number of Occurrences:   1    Standing Expiration Date:   03/09/2023   Hold Tube- Blood Bank    Standing Status:   Future    Number of Occurrences:   1    Standing Expiration Date:   03/09/2023   Follow-up in 3 months. All questions were answered. The patient knows to call the clinic with any problems, questions or concerns.  Earlie Server, MD, PhD [facility] Hematology Oncology 03/08/2022     HISTORY OF PRESENTING ILLNESS:  Suzanne Moses is a  36 y.o.  female with PMH listed below who was referred to me for anemia Reviewed patient's recent labs that was done.   Vaginal delivery in September 2023.  History of preeclampsia.  02/23/2022, patient presented emergency room due to lower extremity edema.  Patient reports ongoing lower extremity edema since her delivery 2 months ago.  BMP was normal. Emergency room CBC showed hemoglobin of 7.8, MCV 74.7.  Patient denies any vaginal bleeding.   She denies recent chest pain on exertion, shortness of breath on minimal exertion, pre-syncopal episodes, or palpitations.  He has had 1 episode of shortness of breath recently during  exertion.  She felt it was due to asthma.  She had not noticed any recent bleeding such as epistaxis, hematuria or hematochezia.  She currently takes ferrous sulfate 325 mg twice daily since her ER visit. Follow extremity edema, patient was given a small supply of Lasix as needed edema which improves her symptoms.  She has a new appointment next week with gynecologist.   MEDICAL HISTORY:  Past Medical History:  Diagnosis Date   Abdominal pain affecting pregnancy 07/29/2015   Anxiety    Bipolar disorder ([place])    Hematoma 10/19/2011   Labor and delivery, indication for care 09/19/2015   Nausea and vomiting of pregnancy, antepartum 28/76/8115   Obesity complicating pregnancy, third trimester (CODE) 09/19/2015    Patient non-compliant, refused intervention or support    history of leaving AMA, 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   Polysubstance abuse ([place])    cocaine, marijuana, methadone, benzos   Pregnancy 72/62/0355   Umbilical hernia     SURGICAL HISTORY: Past Surgical History:  Procedure Laterality Date   OVARY SURGERY     THROAT SURGERY  2015   Remove a polyp   TONSILLECTOMY     UNILATERAL SALPINGECTOMY Left 2013    SOCIAL HISTORY: Social History   Socioeconomic History   Marital status: Significant Other    Spouse name: Not on file   Number of children: Not on file   Years of education: Not on file   Highest education level: Not on file  Occupational History   Not on file  Tobacco Use   Smoking status: Every Day    Packs/day: 0.50    Types: Cigarettes   Smokeless tobacco: Never   Tobacco comments:    Trying to cut back on cigarettes since preg test positive.  Vaping Use   Vaping Use: Never used  Substance and Sexual Activity   Alcohol use: No   Drug use: Not Currently    Comment: methadone stopped 2017   Sexual activity: Yes    Birth control/protection: Surgical    Comment: BTL  Other Topics Concern   Not on file  Social History Narrative   Not on file   Social Determinants of Health   Financial Resource Strain: Not on file  Food Insecurity: No Food Insecurity (01/02/2022)   Hunger Vital Sign    Worried About Running Out of Food in the Last Year: Never true    Ran Out of Food in the Last Year: Never true  Transportation Needs: No Transportation Needs (01/02/2022)   PRAPARE - Hydrologist (Medical): No    Lack of Transportation (Non-Medical): No  Physical Activity: Not on file  Stress: Not on file  Social Connections: Not on file  Intimate Partner Violence: Not At Risk (01/02/2022)   Humiliation, Afraid, Rape, and Kick questionnaire    Fear of Current or Ex-Partner: No    Emotionally Abused: No    Physically Abused: No    Sexually  Abused: No    FAMILY HISTORY: Family History  Problem Relation Age of Onset   Diabetes Mellitus I Mother    Diabetes Mellitus I Father    Hypertension Father    Lung cancer Maternal Aunt    Lung cancer Maternal Uncle    Cervical cancer Maternal Grandmother     ALLERGIES:  is allergic to zofran [ondansetron hcl].  MEDICATIONS:  Current Outpatient Medications  Medication Sig Dispense Refill   acetaminophen (TYLENOL) 325 MG tablet Take 2 tablets (650 mg total) by  mouth every 4 (four) hours as needed (for pain scale < 4).     ALPRAZolam (XANAX) 0.5 MG tablet Take 0.5 mg by mouth 4 (four) times daily as needed.     amphetamine-dextroamphetamine (ADDERALL) 30 MG tablet      Buprenorphine HCl-Naloxone HCl 8-2 MG FILM Place under the tongue.     ferrous sulfate 325 (65 FE) MG tablet Take 1 tablet (325 mg total) by mouth 2 (two) times daily with a meal. 60 tablet 2   furosemide (LASIX) 20 MG tablet Take 1 tablet (20 mg total) by mouth daily for 5 days. 5 tablet 0   ibuprofen (ADVIL) 600 MG tablet Take 1 tablet (600 mg total) by mouth every 6 (six) hours. 30 tablet 0   NIFEdipine (ADALAT CC) 30 MG 24 hr tablet Take 1 tablet (30 mg total) by mouth daily. 30 tablet 1   ziprasidone (GEODON) 80 MG capsule Take 80 mg by mouth 2 (two) times daily with a meal.     benzocaine-Menthol (DERMOPLAST) 20-0.5 % AERO Apply 1 Application topically as needed for irritation (perineal discomfort). (Patient not taking: Reported on 03/08/2022)     coconut oil OIL Apply 1 Application topically as needed. (Patient not taking: Reported on 03/08/2022)  0   Prenatal Vit-Fe Fumarate-FA (MULTIVITAMIN-PRENATAL) 27-0.8 MG TABS tablet Take 1 tablet by mouth daily at 12 noon. (Patient not taking: Reported on 03/08/2022)     witch hazel-glycerin (TUCKS) pad Apply 1 Application topically as needed for hemorrhoids. (Patient not taking: Reported on 03/08/2022) 40 each 12   No current facility-administered medications for this visit.     Review of Systems  Constitutional:  Positive for fatigue. Negative for appetite change, chills and fever.  HENT:   Negative for hearing loss and voice change.   Eyes:  Negative for eye problems.  Respiratory:  Negative for chest tightness and cough.   Cardiovascular:  Positive for leg swelling. Negative for chest pain.  Gastrointestinal:  Negative for abdominal distention, abdominal pain and blood in stool.  Endocrine: Negative for hot flashes.  Genitourinary:  Negative for difficulty urinating and frequency.   Musculoskeletal:  Negative for arthralgias.  Skin:  Negative for itching and rash.  Neurological:  Negative for extremity weakness.  Hematological:  Negative for adenopathy.  Psychiatric/Behavioral:  Negative for confusion.     PHYSICAL EXAMINATION:  Vitals:   03/08/22 1158  BP: (!) 150/91  Pulse: 96  Resp: 17  Temp: (!) 97 F (36.1 C)  SpO2: 99%   Filed Weights   03/08/22 1158  Weight: 132 kg    Physical Exam Constitutional:      General: She is not in acute distress.    Appearance: She is obese.  HENT:     Head: Normocephalic and atraumatic.  Eyes:     General: No scleral icterus. Cardiovascular:     Rate and Rhythm: Normal rate and regular rhythm.     Heart sounds: Normal heart sounds.  Pulmonary:     Effort: Pulmonary effort is normal. No respiratory distress.     Breath sounds: No wheezing.  Abdominal:     General: Bowel sounds are normal. There is no distension.     Palpations: Abdomen is soft.  Musculoskeletal:        General: No deformity. Normal range of motion.     Cervical back: Normal range of motion and neck supple.     Right lower leg: Edema present.     Left lower leg: Edema present.  Skin:    General: Skin is warm and dry.     Findings: No erythema or rash.  Neurological:     Mental Status: She is alert and oriented to person, place, and time. Mental status is at baseline.     Cranial Nerves: No cranial nerve deficit.      Coordination: Coordination normal.  Psychiatric:        Mood and Affect: Mood normal.      LABORATORY DATA:  I have reviewed the data as listed    Latest Ref Rng & Units 03/08/2022   12:56 PM 02/23/2022    7:32 PM 01/02/2022    7:28 AM  CBC  WBC 4.0 - 10.5 K/uL 10.5  9.9  14.4   Hemoglobin 12.0 - 15.0 g/dL 9.4  7.8  8.1   Hematocrit 36.0 - 46.0 % 32.9  27.7  25.9   Platelets 150 - 400 K/uL 370  416  256       Latest Ref Rng & Units 03/08/2022   12:56 PM 02/23/2022    7:32 PM 12/31/2021   11:50 PM  CMP  Glucose 70 - 99 mg/dL 77  93  140   BUN 6 - 20 mg/dL '11  11  11   '$ Creatinine 0.44 - 1.00 mg/dL 0.52  0.64  0.70   Sodium 135 - 145 mmol/L 137  136  135   Potassium 3.5 - 5.1 mmol/L 4.0  3.7  4.6   Chloride 98 - 111 mmol/L 103  104  104   CO2 22 - 32 mmol/L '25  24  26   '$ Calcium 8.9 - 10.3 mg/dL 8.7  8.8  8.9   Total Protein 6.5 - 8.1 g/dL 7.5  7.6  6.8   Total Bilirubin 0.3 - 1.2 mg/dL 0.4  0.3  0.4   Alkaline Phos 38 - 126 U/L 73  75  149   AST 15 - 41 U/L '18  21  25   '$ ALT 0 - 44 U/L '15  18  26       '$ Component Value Date/Time   FERRITIN 12 03/08/2022 1256     RADIOGRAPHIC STUDIES: I have personally reviewed the radiological images as listed and agreed with the findings in the report. DG Chest 2 View  Result Date: 02/23/2022 CLINICAL DATA:  Shortness of breath EXAM: CHEST - 2 VIEW COMPARISON:  Chest x-ray 12/26/2021 FINDINGS: The heart size and mediastinal contours are within normal limits. Both lungs are clear. The visualized skeletal structures are unremarkable. IMPRESSION: No active cardiopulmonary disease. Electronically Signed   By: Ronney Asters M.D.   On: 02/23/2022 20:03

## 2022-03-10 NOTE — Assessment & Plan Note (Addendum)
Differential diagnosis includes lymphedema, CHF, vascular event insufficiency, DVT etc.  Check bilateral lower extremity ultrasound. BMP is negative.  Recommend patient to follow-up in echocardiogram in the future for further evaluation of heart function.  She also discussed with gynecologist

## 2022-03-10 NOTE — Addendum Note (Signed)
Addended by: Earlie Server on: 03/10/2022 11:18 AM   Modules accepted: Orders

## 2022-03-10 NOTE — Assessment & Plan Note (Signed)
Recommend patient to take prenatal vitamin.

## 2022-03-10 NOTE — Assessment & Plan Note (Signed)
Check CBC, iron TIBC ferritin. Today's lab results more consistent with iron deficiency anemia.  Since the start of oral iron supplementation, her hemoglobin has improved.  Still continuing ferrous sulfate 325 mg twice daily is a good option for her.  I also recommend to add vitamin C daily to increase absorption.

## 2022-03-11 ENCOUNTER — Telehealth: Payer: Self-pay

## 2022-03-11 NOTE — Telephone Encounter (Signed)
-----   Message from Earlie Server, MD sent at 03/10/2022 11:15 AM EST ----- Please let patient know that her labs are consistent with iron deficiency anemia.  Hemoglobin has improved comparing to 2 weeks ago, indicating that oral iron supplementation is effective.  I recommend patient to continue taking ferrous sulfate 325 mg twice daily.  I recommend to add vitamin C to improve absorption.  Prescription sent to pharmacy.  Her labs also indicate folate deficiency and I recommend patient to continue taking her prenatal vitamin.  Please arrange patient to follow-up in 3 months, labs prior to MD +/- Venofer.  Labs are ordered.

## 2022-03-11 NOTE — Telephone Encounter (Addendum)
Unable to reach pt by phone. Detailed VM left on ph and CB number provided for a call back there any questions.    Please schedule labs in 3 months  MD/ venofer *new* 1-2 days After labs   Please inform/ mail pt appts.

## 2022-03-12 ENCOUNTER — Encounter: Payer: Medicare Other | Admitting: Obstetrics & Gynecology

## 2022-03-12 ENCOUNTER — Encounter: Payer: Self-pay | Admitting: Oncology

## 2022-03-15 ENCOUNTER — Encounter: Payer: Self-pay | Admitting: Oncology

## 2022-03-15 ENCOUNTER — Ambulatory Visit: Admission: RE | Admit: 2022-03-15 | Payer: Medicare Other | Source: Ambulatory Visit

## 2022-03-18 ENCOUNTER — Ambulatory Visit: Admission: RE | Admit: 2022-03-18 | Payer: Medicare Other | Source: Ambulatory Visit

## 2022-03-18 ENCOUNTER — Encounter: Payer: Self-pay | Admitting: Obstetrics & Gynecology

## 2022-03-22 ENCOUNTER — Ambulatory Visit: Payer: Medicare Other

## 2022-03-29 ENCOUNTER — Ambulatory Visit: Admission: RE | Admit: 2022-03-29 | Payer: Medicare Other | Source: Ambulatory Visit

## 2022-05-12 ENCOUNTER — Encounter: Payer: Self-pay | Admitting: Oncology

## 2022-06-11 ENCOUNTER — Inpatient Hospital Stay: Payer: 59 | Attending: Oncology

## 2022-06-17 MED FILL — Iron Sucrose Inj 20 MG/ML (Fe Equiv): INTRAVENOUS | Qty: 10 | Status: AC

## 2022-06-18 ENCOUNTER — Inpatient Hospital Stay: Payer: 59

## 2022-06-18 ENCOUNTER — Inpatient Hospital Stay: Payer: 59 | Admitting: Oncology

## 2022-06-18 NOTE — Assessment & Plan Note (Deleted)
Check CBC, iron TIBC ferritin. Today's lab results more consistent with iron deficiency anemia.  Since the start of oral iron supplementation, her hemoglobin has improved.  Still continuing ferrous sulfate 325 mg twice daily is a good option for her.  I also recommend to add vitamin C daily to increase absorption. 

## 2022-08-02 ENCOUNTER — Inpatient Hospital Stay: Payer: 59

## 2022-08-02 ENCOUNTER — Telehealth: Payer: Self-pay

## 2022-08-02 MED FILL — Iron Sucrose Inj 20 MG/ML (Fe Equiv): INTRAVENOUS | Qty: 10 | Status: AC

## 2022-08-02 NOTE — Telephone Encounter (Signed)
pt no showed to labs this morning and she is scheduled to see MD Monday. Please contact pt to r/s appts- next avail:   Labs  MD/ venofer 1-2 days after labs

## 2022-08-05 ENCOUNTER — Inpatient Hospital Stay: Payer: 59 | Admitting: Oncology

## 2022-08-05 ENCOUNTER — Inpatient Hospital Stay: Payer: 59

## 2022-08-30 ENCOUNTER — Inpatient Hospital Stay: Payer: 59 | Attending: Oncology

## 2022-09-04 ENCOUNTER — Inpatient Hospital Stay: Payer: 59 | Admitting: Oncology

## 2022-09-04 ENCOUNTER — Inpatient Hospital Stay: Payer: 59

## 2022-09-09 ENCOUNTER — Encounter: Payer: Self-pay | Admitting: Oncology

## 2022-09-17 ENCOUNTER — Encounter: Payer: Self-pay | Admitting: Oncology

## 2022-09-20 ENCOUNTER — Ambulatory Visit: Payer: Medicare Other | Admitting: Family Medicine

## 2022-09-25 ENCOUNTER — Ambulatory Visit: Payer: Medicare Other | Admitting: Family Medicine

## 2022-09-25 NOTE — Progress Notes (Deleted)
      Established patient visit   Patient: Suzanne Moses   DOB: 1985-12-23   37 y.o. Female  MRN: [id number] Visit Date: 09/25/2022  Today's healthcare provider: Sherlyn Hay, DO   No chief complaint on file.  Subjective    HPI  Patient is a 37 year old female whopresents for evaluation of what she thinks may be a hernia.  Medications: Outpatient Medications Prior to Visit  Medication Sig   acetaminophen (TYLENOL) 325 MG tablet Take 2 tablets (650 mg total) by mouth every 4 (four) hours as needed (for pain scale < 4).   ALPRAZolam (XANAX) 0.5 MG tablet Take 0.5 mg by mouth 4 (four) times daily as needed.   amphetamine-dextroamphetamine (ADDERALL) 30 MG tablet    ascorbic acid (VITAMIN C) 500 MG tablet Take 1 tablet (500 mg total) by mouth daily.   benzocaine-Menthol (DERMOPLAST) 20-0.5 % AERO Apply 1 Application topically as needed for irritation (perineal discomfort). (Patient not taking: Reported on 03/08/2022)   Buprenorphine HCl-Naloxone HCl 8-2 MG FILM Place under the tongue.   coconut oil OIL Apply 1 Application topically as needed. (Patient not taking: Reported on 03/08/2022)   ferrous sulfate 325 (65 FE) MG tablet Take 1 tablet (325 mg total) by mouth 2 (two) times daily with a meal.   furosemide (LASIX) 20 MG tablet Take 1 tablet (20 mg total) by mouth daily for 5 days.   ibuprofen (ADVIL) 600 MG tablet Take 1 tablet (600 mg total) by mouth every 6 (six) hours.   NIFEdipine (ADALAT CC) 30 MG 24 hr tablet Take 1 tablet (30 mg total) by mouth daily.   Prenatal Vit-Fe Fumarate-FA (MULTIVITAMIN-PRENATAL) 27-0.8 MG TABS tablet Take 1 tablet by mouth daily at 12 noon. (Patient not taking: Reported on 03/08/2022)   witch hazel-glycerin (TUCKS) pad Apply 1 Application topically as needed for hemorrhoids. (Patient not taking: Reported on 03/08/2022)   ziprasidone (GEODON) 80 MG capsule Take 80 mg by mouth 2 (two) times daily with a meal.   No facility-administered medications  prior to visit.    Review of Systems  {Labs  Heme  Chem  Endocrine  Serology  Results Review (optional):23779}   Objective    There were no vitals taken for this visit. {Show previous vital signs (optional):23777}  Physical Exam  ***  No results found for any visits on 09/25/22.  Assessment & Plan     ***  No follow-ups on file.      {provider attestation***:1}   Sherlyn Hay, DO  [facility] [phone]) [phone] (fax)  [facility]

## 2022-09-27 ENCOUNTER — Ambulatory Visit: Payer: Medicare Other | Admitting: Family Medicine

## 2022-09-27 NOTE — Progress Notes (Deleted)
      Established patient visit   Patient: Suzanne Moses   DOB: Sep 05, 1985   36 y.o. Female  MRN: [id number] Visit Date: 09/27/2022  Today's healthcare provider: Sherlyn Hay, DO   No chief complaint on file.  Subjective    HPI  Patient is a 37 year old female who presents for evaluation of possible hernia.  Medications: Outpatient Medications Prior to Visit  Medication Sig   acetaminophen (TYLENOL) 325 MG tablet Take 2 tablets (650 mg total) by mouth every 4 (four) hours as needed (for pain scale < 4).   ALPRAZolam (XANAX) 0.5 MG tablet Take 0.5 mg by mouth 4 (four) times daily as needed.   amphetamine-dextroamphetamine (ADDERALL) 30 MG tablet    ascorbic acid (VITAMIN C) 500 MG tablet Take 1 tablet (500 mg total) by mouth daily.   benzocaine-Menthol (DERMOPLAST) 20-0.5 % AERO Apply 1 Application topically as needed for irritation (perineal discomfort). (Patient not taking: Reported on 03/08/2022)   Buprenorphine HCl-Naloxone HCl 8-2 MG FILM Place under the tongue.   coconut oil OIL Apply 1 Application topically as needed. (Patient not taking: Reported on 03/08/2022)   ferrous sulfate 325 (65 FE) MG tablet Take 1 tablet (325 mg total) by mouth 2 (two) times daily with a meal.   furosemide (LASIX) 20 MG tablet Take 1 tablet (20 mg total) by mouth daily for 5 days.   ibuprofen (ADVIL) 600 MG tablet Take 1 tablet (600 mg total) by mouth every 6 (six) hours.   NIFEdipine (ADALAT CC) 30 MG 24 hr tablet Take 1 tablet (30 mg total) by mouth daily.   Prenatal Vit-Fe Fumarate-FA (MULTIVITAMIN-PRENATAL) 27-0.8 MG TABS tablet Take 1 tablet by mouth daily at 12 noon. (Patient not taking: Reported on 03/08/2022)   witch hazel-glycerin (TUCKS) pad Apply 1 Application topically as needed for hemorrhoids. (Patient not taking: Reported on 03/08/2022)   ziprasidone (GEODON) 80 MG capsule Take 80 mg by mouth 2 (two) times daily with a meal.   No facility-administered medications prior to visit.     Review of Systems  {Labs  Heme  Chem  Endocrine  Serology  Results Review (optional):23779}   Objective    There were no vitals taken for this visit. {Show previous vital signs (optional):23777}  Physical Exam  ***  No results found for any visits on 09/27/22.  Assessment & Plan     ***  No follow-ups on file.      {provider attestation***:1}   Sherlyn Hay, DO  [facility] [phone]) [phone] (fax)  [facility]

## 2022-10-01 ENCOUNTER — Ambulatory Visit: Payer: Medicare Other | Admitting: Family Medicine

## 2022-10-01 NOTE — Progress Notes (Deleted)
      Established patient visit   Patient: Suzanne Moses   DOB: 12-28-1985   36 y.o. Female  MRN: [id number] Visit Date: 10/01/2022  Today's healthcare provider: Sherlyn Hay, DO   No chief complaint on file.  Subjective    HPI  Patient is a 37 year old female who presents for evaluation of possible hernia.   Medications: Outpatient Medications Prior to Visit  Medication Sig   acetaminophen (TYLENOL) 325 MG tablet Take 2 tablets (650 mg total) by mouth every 4 (four) hours as needed (for pain scale < 4).   ALPRAZolam (XANAX) 0.5 MG tablet Take 0.5 mg by mouth 4 (four) times daily as needed.   amphetamine-dextroamphetamine (ADDERALL) 30 MG tablet    ascorbic acid (VITAMIN C) 500 MG tablet Take 1 tablet (500 mg total) by mouth daily.   benzocaine-Menthol (DERMOPLAST) 20-0.5 % AERO Apply 1 Application topically as needed for irritation (perineal discomfort). (Patient not taking: Reported on 03/08/2022)   Buprenorphine HCl-Naloxone HCl 8-2 MG FILM Place under the tongue.   coconut oil OIL Apply 1 Application topically as needed. (Patient not taking: Reported on 03/08/2022)   ferrous sulfate 325 (65 FE) MG tablet Take 1 tablet (325 mg total) by mouth 2 (two) times daily with a meal.   furosemide (LASIX) 20 MG tablet Take 1 tablet (20 mg total) by mouth daily for 5 days.   ibuprofen (ADVIL) 600 MG tablet Take 1 tablet (600 mg total) by mouth every 6 (six) hours.   NIFEdipine (ADALAT CC) 30 MG 24 hr tablet Take 1 tablet (30 mg total) by mouth daily.   Prenatal Vit-Fe Fumarate-FA (MULTIVITAMIN-PRENATAL) 27-0.8 MG TABS tablet Take 1 tablet by mouth daily at 12 noon. (Patient not taking: Reported on 03/08/2022)   witch hazel-glycerin (TUCKS) pad Apply 1 Application topically as needed for hemorrhoids. (Patient not taking: Reported on 03/08/2022)   ziprasidone (GEODON) 80 MG capsule Take 80 mg by mouth 2 (two) times daily with a meal.   No facility-administered medications prior to visit.     Review of Systems  {Labs  Heme  Chem  Endocrine  Serology  Results Review (optional):23779}   Objective    There were no vitals taken for this visit. {Show previous vital signs (optional):23777}  Physical Exam  ***  No results found for any visits on 10/01/22.  Assessment & Plan     ***  No follow-ups on file.      {provider attestation***:1}   Sherlyn Hay, DO  [facility] [phone] (phone) [phone] (fax)  [facility]

## 2022-11-06 ENCOUNTER — Ambulatory Visit (INDEPENDENT_AMBULATORY_CARE_PROVIDER_SITE_OTHER): Payer: 59

## 2022-11-06 VITALS — Ht 64.0 in | Wt 270.0 lb

## 2022-11-06 DIAGNOSIS — Z0000 Encounter for general adult medical examination without abnormal findings: Secondary | ICD-10-CM

## 2022-11-06 DIAGNOSIS — Z0100 Encounter for examination of eyes and vision without abnormal findings: Secondary | ICD-10-CM

## 2022-11-06 NOTE — Progress Notes (Signed)
Subjective:   Suzanne Moses is a 37 y.o. female who presents for an Initial Medicare Annual Wellness Visit.  Visit Complete: Virtual  I connected with  Suzanne Moses on 11/06/22 by a audio enabled telemedicine application and verified that I am speaking with the correct person using two identifiers.  Patient Location: Home  Provider Location: Office/Clinic  I discussed the limitations of evaluation and management by telemedicine. The patient expressed understanding and agreed to proceed.  Vital Signs: Unable to obtain new vitals due to this being a telehealth visit.  Patient Medicare AWV questionnaire was completed by the patient on (not done); I have confirmed that all information answered by patient is correct and no changes since this date.  Review of Systems    Cardiac Risk Factors include: hypertension;obesity (BMI >30kg/m2);sedentary lifestyle;smoking/ tobacco exposure    Objective:    Today's Vitals   11/06/22 0916 11/06/22 0918  Weight: 270 lb (122.5 kg)   Height: 5\' 4"  (1.626 m)   PainSc:  6    Body mass index is 46.35 kg/m.     11/06/2022    9:41 AM 03/08/2022   11:58 AM 02/23/2022    7:35 PM 01/01/2022   12:24 AM 03/28/2020    6:35 PM 12/20/2019   11:40 AM 09/07/2018    2:00 PM  Advanced Directives  Does Patient Have a Medical Advance Directive? No No No No No No No  Would patient like information on creating a medical advance directive?  No - Patient declined No - Patient declined No - Patient declined  No - Patient declined No - Patient declined    Current Medications (verified) Outpatient Encounter Medications as of 11/06/2022  Medication Sig   acetaminophen (TYLENOL) 325 MG tablet Take 2 tablets (650 mg total) by mouth every 4 (four) hours as needed (for pain scale < 4).   ALPRAZolam (XANAX) 0.5 MG tablet Take 0.5 mg by mouth 4 (four) times daily as needed.   amphetamine-dextroamphetamine (ADDERALL) 30 MG tablet    ascorbic acid (VITAMIN C)  500 MG tablet Take 1 tablet (500 mg total) by mouth daily.   Buprenorphine HCl-Naloxone HCl 8-2 MG FILM Place under the tongue.   ibuprofen (ADVIL) 600 MG tablet Take 1 tablet (600 mg total) by mouth every 6 (six) hours.   ziprasidone (GEODON) 80 MG capsule Take 80 mg by mouth 2 (two) times daily with a meal.   benzocaine-Menthol (DERMOPLAST) 20-0.5 % AERO Apply 1 Application topically as needed for irritation (perineal discomfort). (Patient not taking: Reported on 03/08/2022)   coconut oil OIL Apply 1 Application topically as needed. (Patient not taking: Reported on 03/08/2022)   ferrous sulfate 325 (65 FE) MG tablet Take 1 tablet (325 mg total) by mouth 2 (two) times daily with a meal.   furosemide (LASIX) 20 MG tablet Take 1 tablet (20 mg total) by mouth daily for 5 days.   NIFEdipine (ADALAT CC) 30 MG 24 hr tablet Take 1 tablet (30 mg total) by mouth daily.   Prenatal Vit-Fe Fumarate-FA (MULTIVITAMIN-PRENATAL) 27-0.8 MG TABS tablet Take 1 tablet by mouth daily at 12 noon. (Patient not taking: Reported on 03/08/2022)   witch hazel-glycerin (TUCKS) pad Apply 1 Application topically as needed for hemorrhoids. (Patient not taking: Reported on 03/08/2022)   No facility-administered encounter medications on file as of 11/06/2022.    Allergies (verified) Zofran [ondansetron hcl]   History: Past Medical History:  Diagnosis Date   Abdominal pain affecting pregnancy 07/29/2015   Anxiety  Bipolar disorder (HCC)    Hematoma 10/19/2011   Labor and delivery, indication for care 09/19/2015   Nausea and vomiting of pregnancy, antepartum 05/24/2015   Obesity complicating pregnancy, third trimester (CODE) 09/19/2015   Patient non-compliant, refused intervention or support    history of leaving AMA, 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   Polysubstance abuse (HCC)    cocaine, marijuana, methadone, benzos   Pregnancy 05/24/2015   Umbilical hernia    Past Surgical History:  Procedure Laterality Date   OVARY  SURGERY     THROAT SURGERY  2015   Remove a polyp   TONSILLECTOMY     UNILATERAL SALPINGECTOMY Left 2013   Family History  Problem Relation Age of Onset   Diabetes Mellitus I Mother    Diabetes Mellitus I Father    Hypertension Father    Lung cancer Maternal Aunt    Lung cancer Maternal Uncle    Cervical cancer Maternal Grandmother    Social History   Socioeconomic History   Marital status: Significant Other    Spouse name: Not on file   Number of children: Not on file   Years of education: Not on file   Highest education level: Not on file  Occupational History   Not on file  Tobacco Use   Smoking status: Every Day    Current packs/day: 0.50    Types: Cigarettes   Smokeless tobacco: Never   Tobacco comments:    Trying to cut back on cigarettes since preg test positive.  Vaping Use   Vaping status: Never Used  Substance and Sexual Activity   Alcohol use: No   Drug use: Not Currently    Comment: methadone stopped 2017   Sexual activity: Yes    Birth control/protection: Surgical    Comment: BTL  Other Topics Concern   Not on file  Social History Narrative   Not on file   Social Determinants of Health   Financial Resource Strain: Low Risk  (11/06/2022)   Overall Financial Resource Strain (CARDIA)    Difficulty of Paying Living Expenses: Not hard at all  Food Insecurity: No Food Insecurity (11/06/2022)   Hunger Vital Sign    Worried About Running Out of Food in the Last Year: Never true    Ran Out of Food in the Last Year: Never true  Transportation Needs: No Transportation Needs (11/06/2022)   PRAPARE - Administrator, Civil Service (Medical): No    Lack of Transportation (Non-Medical): No  Physical Activity: Inactive (11/06/2022)   Exercise Vital Sign    Days of Exercise per Week: 0 days    Minutes of Exercise per Session: 0 min  Stress: Stress Concern Present (11/06/2022)   [employer] of Occupational Health - Occupational Stress  Questionnaire    Feeling of Stress : To some extent  Social Connections: Moderately Integrated (11/06/2022)   Social Connection and Isolation Panel [NHANES]    Frequency of Communication with Friends and Family: More than three times a week    Frequency of Social Gatherings with Friends and Family: Never    Attends Religious Services: 1 to 4 times per year    Active Member of [employer] or Organizations: No    Attends Engineer, structural: Never    Marital Status: Living with partner    Tobacco Counseling Ready to quit: Not Answered Counseling given: Not Answered Tobacco comments: Trying to cut back on cigarettes since preg test positive.   Clinical Intake:  Pre-visit preparation completed: Yes  Pain : 0-10 Pain Score: 6  Pain Type: Chronic pain Pain Location: Leg Pain Orientation: Left Pain Descriptors / Indicators: Aching, Burning Pain Onset: More than a month ago Pain Frequency: Intermittent Pain Relieving Factors: Icy Hot, hot baths Effect of Pain on Daily Activities: cannot  Pain Relieving Factors: Icy Hot, hot baths  BMI - recorded: 46.35 Nutritional Status: BMI > 30  Obese Nutritional Risks: None Diabetes: No  How often do you need to have someone help you when you read instructions, pamphlets, or other written materials from your doctor or pharmacy?: 1 - Never  Interpreter Needed?: No  Comments: lives alone with children Information entered by :: B.Octavie Westerhold,LPN   Activities of Daily Living    11/06/2022    9:41 AM 02/08/2022   11:57 AM  In your present state of health, do you have any difficulty performing the following activities:  Hearing? 0 1  Vision? 1 1  Difficulty concentrating or making decisions? 0 1  Walking or climbing stairs? 0 0  Dressing or bathing? 0 0  Doing errands, shopping? 0 1  Preparing Food and eating ? N   Using the Toilet? N   In the past six months, have you accidently leaked urine? N   Do you have problems with loss of  bowel control? N   Managing your Medications? N   Managing your Finances? N   Housekeeping or managing your Housekeeping? N     Patient Care Team: Malva Limes, MD as PCP - General (Family Medicine)  Indicate any recent Medical Services you may have received from other than [facility] providers in the past year (date may be approximate).     Assessment:   This is a routine wellness examination for Suzanne Moses.  Hearing/Vision screen Hearing Screening - Comments:: Adequate hearing Vision Screening - Comments:: Inadequate vision:blurry Dr Marthenia Rolling referral made  Dietary issues and exercise activities discussed:     Goals Addressed             This Visit's Progress    Weight (lb) < 200 lb (90.7 kg)   270 lb (122.5 kg)    Eat smaller meals Increase water :decrease or cut out sugared drinks Decrease starches and add green leafy vegetables       Depression Screen    11/06/2022    9:30 AM 02/08/2022   11:57 AM 05/22/2021   10:53 AM 02/22/2021   10:12 AM 12/01/2017   11:19 AM 08/20/2017   11:25 AM 07/24/2017    1:49 PM  PHQ 2/9 Scores  PHQ - 2 Score 2 1 0 2 0 0 0  PHQ- 9 Score 7 6  8 3       Fall Risk    11/06/2022    9:24 AM 02/08/2022   11:57 AM 02/22/2021   10:12 AM 12/01/2017   11:19 AM 08/20/2017   11:25 AM  Fall Risk   Falls in the past year? 0 0 0 No No  Number falls in past yr: 0 0 0    Injury with Fall? 0 0 0    Risk for fall due to : No Fall Risks No Fall Risks     Follow up Education provided;Falls prevention discussed Falls evaluation completed       MEDICARE RISK AT HOME:  Medicare Risk at Home - 11/06/22 0924     Any stairs in or around the home? No    If so, are there any without handrails? No    Home  free of loose throw rugs in walkways, pet beds, electrical cords, etc? Yes    Adequate lighting in your home to reduce risk of falls? Yes    Life alert? No    Use of a cane, walker or w/c? No    Grab bars in the bathroom? Yes    Shower chair or bench  in shower? No    Elevated toilet seat or a handicapped toilet? No             TIMED UP AND GO:  Was the test performed? No    Cognitive Function:        11/06/2022    9:42 AM  6CIT Screen  What Year? 0 points  What month? 0 points  What time? 0 points  Count back from 20 0 points  Months in reverse 0 points  Repeat phrase 0 points  Total Score 0 points    Immunizations Immunization History  Administered Date(s) Administered   DTaP 11/22/1988, 08/19/1989, 09/18/1990, 10/30/1992   HIB (PRP-OMP) 08/19/1989   Hepatitis A, Ped/Adol-2 Dose 01/18/2000, 08/15/2000   Hepatitis B 01/18/2000, 02/22/2000, 08/15/2000   IPV 11/22/1988, 08/19/1989, 09/18/1990, 10/30/1992   Influenza,inj,Quad PF,6+ Mos 02/22/2021   MMR 08/19/1989, 10/30/1992   Td 08/23/2008    TDAP status: Up to date  Flu Vaccine status: Declined, Education has been provided regarding the importance of this vaccine but patient still declined. Advised may receive this vaccine at local pharmacy or Health Dept. Aware to provide a copy of the vaccination record if obtained from local pharmacy or Health Dept. Verbalized acceptance and understanding.   Covid-19 vaccine status: Declined, Education has been provided regarding the importance of this vaccine but patient still declined. Advised may receive this vaccine at local pharmacy or Health Dept.or vaccine clinic. Aware to provide a copy of the vaccination record if obtained from local pharmacy or Health Dept. Verbalized acceptance and understanding.  Qualifies for Shingles Vaccine? No    Screening Tests Health Maintenance  Topic Date Due   COVID-19 Vaccine (1) Never done   Hepatitis C Screening  Never done   PAP SMEAR-Modifier  02/19/2018   DTaP/Tdap/Td (6 - Tdap) 08/24/2018   INFLUENZA VACCINE  11/07/2022   Medicare Annual Wellness (AWV)  11/06/2023   HIV Screening  Completed   HPV VACCINES  Aged Out    Health Maintenance  Health Maintenance Due  Topic  Date Due   COVID-19 Vaccine (1) Never done   Hepatitis C Screening  Never done   PAP SMEAR-Modifier  02/19/2018   DTaP/Tdap/Td (6 - Tdap) 08/24/2018    Lung Cancer Screening: (Low Dose CT Chest recommended if Age 35-80 years, 20 pack-year currently smoking OR have quit w/in 15years.) does not qualify.   Lung Cancer Screening Referral: no  Additional Screening:  Hepatitis C Screening: does not qualify; Completed yes  Vision Screening: Recommended annual ophthalmology exams for early detection of glaucoma and other disorders of the eye. Is the patient up to date with their annual eye exam?  No  Who is the provider or what is the name of the office in which the patient attends annual eye exams? none If pt is not established with a provider, would they like to be referred to a provider to establish care? Yes .   Dental Screening: Recommended annual dental exams for proper oral hygiene  Diabetic Foot Exam: n/a  Community Resource Referral / Chronic Care Management: CRR required this visit?  No   CCM required this visit?  No     Plan:     I have personally reviewed and noted the following in the patient's chart:   Medical and social history Use of alcohol, tobacco or illicit drugs  Current medications and supplements including opioid prescriptions. Patient is not currently taking opioid prescriptions. Functional ability and status Nutritional status Physical activity Advanced directives List of other physicians Hospitalizations, surgeries, and ER visits in previous 12 months Vitals Screenings to include cognitive, depression, and falls Referrals and appointments  In addition, I have reviewed and discussed with patient certain preventive protocols, quality metrics, and best practice recommendations. A written personalized care plan for preventive services as well as general preventive health recommendations were provided to patient.     Sue Lush, LPN   1/61/0960    After Visit Summary: (MyChart) Due to this being a telephonic visit, the after visit summary with patients personalized plan was offered to patient via MyChart   Nurse Notes: Pt relays she is having issues with depression due to the loss of her mother in April this year and her brother. She relays she has a therapist in [place] she sees and next visit in August. She has questions regarding an old brain bleed (whether she needs follow-up) and a hernia at umbilicus. Pt has appt with PCP on 11/18/22. Pt will keep appt and inquire. Pt has never had an eye exam and has blurry vision.  *Referral for Eye exam

## 2022-11-18 ENCOUNTER — Telehealth: Payer: Self-pay | Admitting: Family Medicine

## 2022-11-18 ENCOUNTER — Ambulatory Visit (INDEPENDENT_AMBULATORY_CARE_PROVIDER_SITE_OTHER): Payer: 59 | Admitting: Family Medicine

## 2022-11-18 ENCOUNTER — Encounter: Payer: Self-pay | Admitting: Family Medicine

## 2022-11-18 DIAGNOSIS — K429 Umbilical hernia without obstruction or gangrene: Secondary | ICD-10-CM | POA: Diagnosis not present

## 2022-11-18 DIAGNOSIS — L2082 Flexural eczema: Secondary | ICD-10-CM | POA: Diagnosis not present

## 2022-11-18 MED ORDER — TRIAMCINOLONE ACETONIDE 0.5 % EX CREA: TOPICAL_CREAM | Freq: Two times a day (BID) | CUTANEOUS | 1 refills | Status: DC

## 2022-11-18 MED ORDER — PHENTERMINE HCL 15 MG PO CAPS: 15.0000 mg | ORAL_CAPSULE | ORAL | 1 refills | Status: DC

## 2022-11-18 MED ORDER — TOPIRAMATE 50 MG PO TABS: ORAL_TABLET | ORAL | 2 refills | Status: DC

## 2022-11-18 NOTE — Progress Notes (Signed)
Established patient visit   Patient: Suzanne Moses   DOB: [birth year]   36 y.o. Female  MRN: [id number] Visit Date: 11/18/2022  Today's healthcare provider: Mila Merry, MD   Chief Complaint  Patient presents with   Umbilical Hernia    Patient reports growing and would like to know next steps.   Subjective    Discussed the use of AI scribe software for clinical note transcription with the patient, who gave verbal consent to proceed.  History of Present Illness   The patient presents with a longstanding umbilical hernia that has recently increased in size. She describes the hernia as a circular protrusion in the belly button area. The patient expresses relief upon confirmation that the mass is indeed a hernia and not a tumor. She is open to surgical intervention and has no prior relationship with a Careers adviser.  In addition to the hernia, the patient is seeking assistance with weight loss. She has a history of significant weight loss (110 pounds) about 10 years ago with the aid of phentermine. However, she regained the weight after having three children. She is interested in restarting the phentermine and possibly adding topiramate to her regimen, which she was prescribed briefly about 2 years ago.   The patient also reports psoriasis on both forearms, which worsens when washing dishes and causes significant itching. She has previously used a prescription cream for this condition, which she found helpful.       Medications: Outpatient Medications Prior to Visit  Medication Sig   acetaminophen (TYLENOL) 325 MG tablet Take 2 tablets (650 mg total) by mouth every 4 (four) hours as needed (for pain scale < 4).   ALPRAZolam (XANAX) 0.5 MG tablet Take 0.5 mg by mouth 4 (four) times daily as needed.   amphetamine-dextroamphetamine (ADDERALL) 30 MG tablet    ascorbic acid (VITAMIN C) 500 MG tablet Take 1 tablet (500 mg total) by mouth daily.   benzocaine-Menthol (DERMOPLAST) 20-0.5  % AERO Apply 1 Application topically as needed for irritation (perineal discomfort).   Buprenorphine HCl-Naloxone HCl 8-2 MG FILM Place under the tongue.   coconut oil OIL Apply 1 Application topically as needed.   ibuprofen (ADVIL) 600 MG tablet Take 1 tablet (600 mg total) by mouth every 6 (six) hours.   Prenatal Vit-Fe Fumarate-FA (MULTIVITAMIN-PRENATAL) 27-0.8 MG TABS tablet Take 1 tablet by mouth daily at 12 noon.   witch hazel-glycerin (TUCKS) pad Apply 1 Application topically as needed for hemorrhoids.   ziprasidone (GEODON) 80 MG capsule Take 80 mg by mouth 2 (two) times daily with a meal.   ferrous sulfate 325 (65 FE) MG tablet Take 1 tablet (325 mg total) by mouth 2 (two) times daily with a meal.   furosemide (LASIX) 20 MG tablet Take 1 tablet (20 mg total) by mouth daily for 5 days.   NIFEdipine (ADALAT CC) 30 MG 24 hr tablet Take 1 tablet (30 mg total) by mouth daily.   No facility-administered medications prior to visit.   Review of Systems     Objective    BP 129/75 (BP Location: Right Wrist, Patient Position: Sitting, Cuff Size: Normal)   Pulse 86   Wt 282 lb 6.4 oz (128.1 kg)   BMI 48.47 kg/m   Physical Exam  Large umbilical hernia. No abdominal tenderness.  Slightly red scaly eruption on ventral aspect of both forearms.    Assessment & Plan     Assessment and Plan    Umbilical Hernia Noted  to be increasing in size and causing abdominal discomfort.. No other pain. Discussed surgical repair. -Refer to surgical group for evaluation and potential repair.  Obesity Patient has a history of successful weight loss with phentermine 37.5 several years ago, and tolerated brief trial of phentermine/topiramate about 2 years ago.  -Prescribe phentermine and topiramate. -Schedule follow-up in six weeks to assess efficacy and tolerance.  Eczema Patient reports itching and worsening of psoriasis on forearms, particularly with dishwashing. -Prescribe topical treatment for  psoriasis.   Follow-up in 6 weeks to assess response to weight loss medications and psoriasis treatment.          Mila Merry, MD  [facility] [phone] (phone) [phone] (fax)  [facility]

## 2022-11-18 NOTE — Telephone Encounter (Signed)
Received a fax from covermymeds for phentermine HCI 15mg   Key: [employer]

## 2022-11-18 NOTE — Patient Instructions (Signed)
.   Please review the attached list of medications and notify my office if there are any errors.   . Please bring all of your medications to every appointment so we can make sure that our medication list is the same as yours.   

## 2022-11-20 NOTE — Telephone Encounter (Signed)
Outcome Denied Medication: Phentermine Cap 15mg  Per decision notes:  "Drugs, when used for anorexia, weight loss or weight gain, are excluded from coverage under Medicare prescription drug coverage. " Letter is scanned under media tab

## 2022-12-04 ENCOUNTER — Ambulatory Visit: Payer: Self-pay | Admitting: *Deleted

## 2022-12-04 NOTE — Telephone Encounter (Signed)
  Chief Complaint: SHoulder and heel pain Symptoms: Pain of left shoulder, both heels. Level varies. Discussed with Dr. Sherrie Mustache at last appt  "York Spaniel maybe nerve like pain." States would like work-up for diabetes.  Frequency: 1 year Pertinent Negatives: Patient denies redness, warmth,injury Disposition: [] ED /[] Urgent Care (no appt availability in office) / [x] Appointment(In office/virtual)/ []  [place] [facility]/ [] Home Care/ [] Refused Recommended Disposition /[] [place] [facility]/ []  Follow-up with PCP Additional Notes: Pt has appt secured earlier for 12/31/22 for weight management. Stated called to ask to add issues to appt. Transferred to NT when mentioned shoulder pain. Offered earlier appt. States hard to find sitter, will keep later appt. Added to wait list. Advised UC/ED for worsening symptoms. Pt verbalizes understanding.  Reason for Disposition  Shoulder pain is a chronic symptom (recurrent or ongoing AND present > 4 weeks)  Answer Assessment - Initial Assessment Questions 1. ONSET: "When did the pain start?"     1 year, carrying son around. Pain in heels also 2. LOCATION: "Where is the pain located?"     Both heels new pain and left shoulder 3. PAIN: "How bad is the pain?" (Scale 1-10; or mild, moderate, severe)   - MILD (1-3): doesn't interfere with normal activities   - MODERATE (4-7): interferes with normal activities (e.g., work or school) or awakens from sleep   - SEVERE (8-10): excruciating pain, unable to do any normal activities, unable to move arm at all due to pain     7/10, varies 3-7/10 4. WORK OR EXERCISE: "Has there been any recent work or exercise that involved this part of the body?"     Carrying son 5. CAUSE: "What do you think is causing the shoulder pain?"     Carrying son 6. OTHER SYMPTOMS: "Do you have any other symptoms?" (e.g., neck pain, swelling, rash, fever, numbness, weakness)     Neck pain  Protocols used: Shoulder Pain-A-AH

## 2022-12-18 ENCOUNTER — Other Ambulatory Visit: Payer: Self-pay | Admitting: Family Medicine

## 2022-12-18 DIAGNOSIS — L2082 Flexural eczema: Secondary | ICD-10-CM

## 2022-12-31 ENCOUNTER — Ambulatory Visit: Payer: 59 | Admitting: Family Medicine

## 2023-01-17 ENCOUNTER — Ambulatory Visit: Payer: 59 | Admitting: Family Medicine

## 2023-01-29 ENCOUNTER — Telehealth: Payer: Self-pay

## 2023-01-29 NOTE — Telephone Encounter (Signed)
Patient left a voicemail stated she has a couple miss calls from [place]. I called her back to let her we received her message. No one answered.

## 2023-02-04 ENCOUNTER — Telehealth: Payer: Self-pay

## 2023-02-04 DIAGNOSIS — R109 Unspecified abdominal pain: Secondary | ICD-10-CM

## 2023-02-04 NOTE — Telephone Encounter (Signed)
I don't see anything in her chart about GI referral. What is the medical reason for the referral she is requesting.

## 2023-02-04 NOTE — Telephone Encounter (Signed)
Copied from CRM [id number]. Topic: General - Other >> Feb 03, 2023  4:47 PM Epimenio Foot F wrote: Reason for CRM: Pt is calling in because she needs another [facility] referral sent in. Pt says the one she had expired. Pt wants to know can she just have another referral sent in or would she need another appointment. Please follow up with pt.

## 2023-02-05 NOTE — Telephone Encounter (Addendum)
Patient would like referral to GI for abdominal discomfort. Reports that she feels this is more of a tumor than a hernia. Feels that is growing more.   She doesn't want to do general surgery feels like all three different doctors she has seen only feel it but doesn't say anything to her.  FYI-looks like she was seen by GI over at [facility] abdominal pain and diarrhea 09/25/2022

## 2023-02-05 NOTE — Addendum Note (Signed)
Addended by: Malva Limes on: 02/05/2023 03:08 PM   Modules accepted: Orders

## 2023-03-03 ENCOUNTER — Ambulatory Visit: Payer: 59 | Admitting: Family Medicine

## 2023-03-03 ENCOUNTER — Encounter: Payer: Self-pay | Admitting: Family Medicine

## 2023-03-03 VITALS — BP 135/84 | HR 87 | Resp 16 | Ht 63.0 in | Wt 275.9 lb

## 2023-03-03 DIAGNOSIS — K429 Umbilical hernia without obstruction or gangrene: Secondary | ICD-10-CM

## 2023-03-03 DIAGNOSIS — D509 Iron deficiency anemia, unspecified: Secondary | ICD-10-CM

## 2023-03-03 DIAGNOSIS — R739 Hyperglycemia, unspecified: Secondary | ICD-10-CM

## 2023-03-03 DIAGNOSIS — Z23 Encounter for immunization: Secondary | ICD-10-CM

## 2023-03-03 DIAGNOSIS — Z136 Encounter for screening for cardiovascular disorders: Secondary | ICD-10-CM

## 2023-03-03 MED ORDER — TOPIRAMATE 50 MG PO TABS: 50.0000 mg | ORAL_TABLET | Freq: Every day | ORAL | 5 refills | Status: DC

## 2023-03-03 MED ORDER — PHENTERMINE HCL 15 MG PO CAPS: ORAL_CAPSULE | ORAL | 5 refills | Status: DC

## 2023-03-03 NOTE — Progress Notes (Unsigned)
Established patient visit   Patient: Suzanne Moses   DOB: Dec 13, 1985   37 y.o. Female  MRN: [id number] Visit Date: 03/03/2023  Today's healthcare provider: Mila Merry, MD   Chief Complaint  Patient presents with   general concerns   Subjective    Discussed the use of AI scribe software for clinical note transcription with the patient, who gave verbal consent to proceed.  History of Present Illness   The patient, with a known umbilical hernia, presents with concerns about the hernia's growth and a desire for a gastroenterologist's evaluation before considering surgical intervention. She reports no digestive issues related to the hernia.  Additionally, she describes a sharp pain in the back of her left shoulder, which occurs during certain activities such as washing dishes or grinding pepper. The pain is localized near the shoulder blade.  The patient also reports intermittent numbness in her fingertips and heel, occurring approximately one week per month. She has a history of receiving shots in both wrists for a similar issue seven years ago, which resolved the pain but the numbness has recently returned.  Furthermore, she experiences short, sharp pains in her Achilles tendon when bending over. The pain is localized above the heel, not on the heel itself.  The patient also mentions a preference for standing over sitting, with no associated pain or discomfort when sitting. She reports no new medications and is currently on a regimen for weight loss.  Family history is significant for diabetes and high blood pressure, with recent diagnoses of diabetes in two siblings. The patient has a history of gestational diabetes. She also reports a family history of busted blood vessels.  The patient's lifestyle includes a significant amount of standing, even at home, and she reports a recent diet of avocado toast and skim eggnog.       Medications: Outpatient Medications Prior to  Visit  Medication Sig   acetaminophen (TYLENOL) 325 MG tablet Take 2 tablets (650 mg total) by mouth every 4 (four) hours as needed (for pain scale < 4).   ALPRAZolam (XANAX) 0.5 MG tablet Take 0.5 mg by mouth 4 (four) times daily as needed.   amphetamine-dextroamphetamine (ADDERALL) 30 MG tablet    benzocaine-Menthol (DERMOPLAST) 20-0.5 % AERO Apply 1 Application topically as needed for irritation (perineal discomfort).   Buprenorphine HCl-Naloxone HCl 8-2 MG FILM Place under the tongue.   coconut oil OIL Apply 1 Application topically as needed.   ibuprofen (ADVIL) 600 MG tablet Take 1 tablet (600 mg total) by mouth every 6 (six) hours.   phentermine 15 MG capsule TAKE 1 CAPSULE BY MOUTH ONCE EVERY MORNING   topiramate (TOPAMAX) 50 MG tablet Take 1 tablet (50 mg total) by mouth daily.   triamcinolone cream (KENALOG) 0.5 % APPLY TO AFFECTED AREA(s) TWICE DAILY   ziprasidone (GEODON) 80 MG capsule Take 80 mg by mouth 2 (two) times daily with a meal.   ferrous sulfate 325 (65 FE) MG tablet Take 1 tablet (325 mg total) by mouth 2 (two) times daily with a meal.   NIFEdipine (ADALAT CC) 30 MG 24 hr tablet Take 1 tablet (30 mg total) by mouth daily.   [DISCONTINUED] ascorbic acid (VITAMIN C) 500 MG tablet Take 1 tablet (500 mg total) by mouth daily.   [DISCONTINUED] furosemide (LASIX) 20 MG tablet Take 1 tablet (20 mg total) by mouth daily for 5 days.   [DISCONTINUED] Prenatal Vit-Fe Fumarate-FA (MULTIVITAMIN-PRENATAL) 27-0.8 MG TABS tablet Take 1 tablet by  mouth daily at 12 noon.   [DISCONTINUED] witch hazel-glycerin (TUCKS) pad Apply 1 Application topically as needed for hemorrhoids.   No facility-administered medications prior to visit.   Review of Systems {Insert previous labs (optional):23779} {See past labs  Heme  Chem  Endocrine  Serology  Results Review (optional):1}   Objective    BP 135/84 (BP Location: Left Arm, Patient Position: Sitting, Cuff Size: Large)   Pulse 87   Resp 16    Ht 5\' 3"  (1.6 m)   Wt 275 lb 14.4 oz (125.1 kg)   LMP 02/26/2023   SpO2 100%   BMI 48.87 kg/m {Insert last BP/Wt (optional):23777}{See vitals history (optional):1}  Physical Exam   General: Appearance:    Severely obese female in no acute distress  Eyes:    PERRL, conjunctiva/corneas clear, EOM's intact       Lungs:     Clear to auscultation bilaterally, respirations unlabored  Abd:   Large non tender umbilical hernia.   Heart:    Normal heart rate. Normal rhythm. No murmurs, rubs, or gallops.    MS:   All extremities are intact.  Slight tenderness of left achilles tender. No heal tenderness. Tender just to medial side of left scapula. No swelling or other gross deformities.   Neurologic:   Awake, alert, oriented x 3. No apparent focal neurological defect.         Assessment & Plan       Umbilical Hernia Patient reports increasing size of hernia. No associated digestive symptoms. Discussed potential surgical correction. -Encouraged weight loss to potentially alleviate symptoms. -Consider surgical referral if symptoms persist or worsen.  Shoulder Pain Sharp pain in the back of the shoulder, possibly related to tendonitis. -Recommended application of ice for inflammation.  Achilles Tendon Pain Short, sharp pains in the Achilles tendon. -Continue exercises and monitor symptoms.  Numbness in Fingertips and Heel Reports intermittent numbness in fingertips and heel, approximately one week per month. -Order blood work to check B vitamins and A1c, which could be related to numbness.  Iron deficient anemia -check CBC in iron studies  Morbid obesity Start back on phentermine+topiramate which she feels effectively suppressed her appetite.   Family History of Diabetes  Two sisters recently diagnosed with diabetes. Patient had gestational diabetes in the past and elevated blood sugar -Order blood work to check glucose levels and thyroid function. -Discussed potential use of weight  loss medications such as Ozempic or Wegovy if patient is diagnosed with diabetes.  Hypertension Family history of hypertension. Blood pressure was high at today's visit but improved upon recheck. -Encouraged weight loss to potentially improve blood pressure. -Monitor blood pressure.  General Health Maintenance -Administer influenza vaccine today.    No follow-ups on file.      Mila Merry, MD  [facility] [phone] (phone) [phone] (fax)  [facility]

## 2023-03-04 ENCOUNTER — Telehealth: Payer: Self-pay | Admitting: Family Medicine

## 2023-03-04 LAB — COMPREHENSIVE METABOLIC PANEL
ALT: 21 [IU]/L (ref 0–32)
AST: 20 [IU]/L (ref 0–40)
Albumin: 4.4 g/dL (ref 3.9–4.9)
Alkaline Phosphatase: 99 [IU]/L (ref 44–121)
BUN/Creatinine Ratio: 19 (ref 9–23)
BUN: 13 mg/dL (ref 6–20)
Bilirubin Total: <0.2 mg/dL (ref 0.0–1.2)
CO2: 24 mmol/L (ref 20–29)
Calcium: 9 mg/dL (ref 8.7–10.2)
Chloride: 101 mmol/L (ref 96–106)
Creatinine, Ser: 0.69 mg/dL (ref 0.57–1.00)
Globulin, Total: 3.1 g/dL (ref 1.5–4.5)
Glucose: 95 mg/dL (ref 70–99)
Potassium: 4.3 mmol/L (ref 3.5–5.2)
Sodium: 139 mmol/L (ref 134–144)
Total Protein: 7.5 g/dL (ref 6.0–8.5)
eGFR: 116 mL/min/{1.73_m2} (ref >59)

## 2023-03-04 LAB — LIPID PANEL
Chol/HDL Ratio: 4.2 {ratio} (ref 0.0–4.4)
Cholesterol, Total: 144 mg/dL (ref 100–199)
HDL: 34 mg/dL — ABNORMAL LOW (ref >39)
LDL Chol Calc (NIH): 89 mg/dL (ref 0–99)
Triglycerides: 115 mg/dL (ref 0–149)
VLDL Cholesterol Cal: 21 mg/dL (ref 5–40)

## 2023-03-04 LAB — CBC
Hematocrit: 41.1 % (ref 34.0–46.6)
Hemoglobin: 12.6 g/dL (ref 11.1–15.9)
MCH: 25.1 pg — ABNORMAL LOW (ref 26.6–33.0)
MCHC: 30.7 g/dL — ABNORMAL LOW (ref 31.5–35.7)
MCV: 82 fL (ref 79–97)
Platelets: 321 10*3/uL (ref 150–450)
RBC: 5.01 x10E6/uL (ref 3.77–5.28)
RDW: 15.2 % (ref 11.7–15.4)
WBC: 9.5 10*3/uL (ref 3.4–10.8)

## 2023-03-04 LAB — IRON,TIBC AND FERRITIN PANEL
Ferritin: 35 ng/mL (ref 15–150)
Iron Saturation: 6 % — CL (ref 15–55)
Iron: 20 ug/dL — ABNORMAL LOW (ref 27–159)
Total Iron Binding Capacity: 342 ug/dL (ref 250–450)
UIBC: 322 ug/dL (ref 131–425)

## 2023-03-04 LAB — TSH: TSH: 1.63 u[IU]/mL (ref 0.450–4.500)

## 2023-03-04 LAB — VITAMIN B12: Vitamin B-12: 837 pg/mL (ref 232–1245)

## 2023-03-04 LAB — HEMOGLOBIN A1C
Est. average glucose Bld gHb Est-mCnc: 111 mg/dL
Hgb A1c MFr Bld: 5.5 % (ref 4.8–5.6)

## 2023-03-04 NOTE — Telephone Encounter (Signed)
Received fax from Cover My Meds for Phentermine HCI 15 mg.   Key:  Suzanne Moses

## 2023-03-04 NOTE — Telephone Encounter (Signed)
PA initiated

## 2023-04-25 ENCOUNTER — Ambulatory Visit: Payer: Self-pay

## 2023-04-25 ENCOUNTER — Emergency Department: Payer: 59

## 2023-04-25 ENCOUNTER — Other Ambulatory Visit: Payer: Self-pay

## 2023-04-25 DIAGNOSIS — I10 Essential (primary) hypertension: Secondary | ICD-10-CM | POA: Insufficient documentation

## 2023-04-25 DIAGNOSIS — K429 Umbilical hernia without obstruction or gangrene: Secondary | ICD-10-CM | POA: Diagnosis not present

## 2023-04-25 DIAGNOSIS — D72829 Elevated white blood cell count, unspecified: Secondary | ICD-10-CM | POA: Diagnosis not present

## 2023-04-25 LAB — COMPREHENSIVE METABOLIC PANEL
ALT: 20 U/L (ref 0–44)
AST: 20 U/L (ref 15–41)
Albumin: 4.3 g/dL (ref 3.5–5.0)
Alkaline Phosphatase: 76 U/L (ref 38–126)
Anion gap: 10 (ref 5–15)
BUN: 14 mg/dL (ref 6–20)
CO2: 26 mmol/L (ref 22–32)
Calcium: 8.8 mg/dL — ABNORMAL LOW (ref 8.9–10.3)
Chloride: 102 mmol/L (ref 98–111)
Creatinine, Ser: 0.76 mg/dL (ref 0.44–1.00)
GFR, Estimated: >60 mL/min (ref >60)
Glucose, Bld: 112 mg/dL — ABNORMAL HIGH (ref 70–99)
Potassium: 4 mmol/L (ref 3.5–5.1)
Sodium: 138 mmol/L (ref 135–145)
Total Bilirubin: 0.4 mg/dL (ref 0.0–1.2)
Total Protein: 8.1 g/dL (ref 6.5–8.1)

## 2023-04-25 LAB — CBC
HCT: 41.6 % (ref 36.0–46.0)
Hemoglobin: 13.1 g/dL (ref 12.0–15.0)
MCH: 26.1 pg (ref 26.0–34.0)
MCHC: 31.5 g/dL (ref 30.0–36.0)
MCV: 83 fL (ref 80.0–100.0)
Platelets: 293 10*3/uL (ref 150–400)
RBC: 5.01 MIL/uL (ref 3.87–5.11)
RDW: 17.1 % — ABNORMAL HIGH (ref 11.5–15.5)
WBC: 12.2 10*3/uL — ABNORMAL HIGH (ref 4.0–10.5)
nRBC: 0 % (ref 0.0–0.2)

## 2023-04-25 NOTE — ED Provider Triage Note (Signed)
Emergency Medicine Provider Triage Evaluation Note  Suzanne Moses , a 38 y.o. female  was evaluated in triage.  Pt complains of abdominal pain with nausea. She has an umbilical hernia that has been coming out. It gets very hard and painful. Symptoms last about 30 minutes. She called her doctor today and was advised to come to the ER.  Physical Exam  BP (!) 143/81 (BP Location: Right Arm)   Pulse 90   Temp 97.9 F (36.6 C) (Oral)   Resp 18   Ht 5\' 4"  (1.626 m)   Wt 127 kg   LMP 04/02/2023 (Approximate)   SpO2 90%   BMI 48.06 kg/m  Gen:   Awake, no distress   Resp:  Normal effort  MSK:   Moves extremities without difficulty  Other:  Abdomen is soft.   Medical Decision Making  Medically screening exam initiated at 9:26 PM.  Appropriate orders placed.  Suzanne Moses was informed that the remainder of the evaluation will be completed by another provider, this initial triage assessment does not replace that evaluation, and the importance of remaining in the ED until their evaluation is complete.  Labs sent. [place] for hernia evaluation ordered.    Chinita Pester, FNP 04/25/23 2129

## 2023-04-25 NOTE — ED Triage Notes (Signed)
Pt sts that she has an umbilical hernia and has been having problems with N/V. Pt called her PCP about the issue and wanted to get it looked out.

## 2023-04-25 NOTE — Telephone Encounter (Signed)
  Chief Complaint: severe pain that comes and goes, abdomen gets hard and tight and causes pt to break out in cold sweat.  Symptoms: severe abd pain especially to the left of navel Frequency: stated has had episodes for past 1.5 months but is happening more frequently  Disposition: [x] ED /[] Urgent Care (no appt availability in office) / [] Appointment(In office/virtual)/ []  [place] [facility]/ [] Home Care/ [] Refused Recommended Disposition /[] [place] [facility]/ []  Follow-up with PCP Additional Notes:  Reason for Disposition  SEVERE abdominal pain    Comes and goes  Answer Assessment - Initial Assessment Questions 1. ONSET:  "When did this first appear?"     1.5 months 2. APPEARANCE: "What does it look like?"     Tight and hard 4. LOCATION: "Where exactly is the hernia located?"     Navel- abd hard and tight especially left side 5. PATTERN: "Does the swelling come and go, or has it been constant since it started?"     Comes and goes 6. PAIN: "Is there any pain?" If Yes, ask: "How bad is it?"  (Scale 1-10; or mild, moderate, severe)    - MILD (1-3): Doesn't interfere with normal activities, abdomen soft and not tender to touch.     - MODERATE (4-7): Interferes with normal activities or awakens from sleep, abdomen tender to touch.     - SEVERE (8-10): Excruciating pain, doubled over, unable to do any normal activities.       severe  Answer Assessment - Initial Assessment Questions 1. LOCATION: "Where does it hurt?"      Navel area 3. ONSET: "When did the pain begin?" (e.g., minutes, hours or days ago)      1.5 months- happening more often 4. SUDDEN: "Gradual or sudden onset?"     Sudden  5. PATTERN "Does the pain come and go, or is it constant?"    - If it comes and goes: "How long does it last?" "Do you have pain now?"     (Note: Comes and goes means the pain is intermittent. It goes away completely between bouts.)    - If constant: "Is it getting better, staying the same,  or getting worse?"      (Note: Constant means the pain never goes away completely; most serious pain is constant and gets worse.)      Comes and goes 6. SEVERITY: "How bad is the pain?"  (e.g., Scale 1-10; mild, moderate, or severe)    - MILD (1-3): Doesn't interfere with normal activities, abdomen soft and not tender to touch.     - MODERATE (4-7): Interferes with normal activities or awakens from sleep, abdomen tender to touch.     - SEVERE (8-10): Excruciating pain, doubled over, unable to do any normal activities.       When it occurs causes her to go into a cold sweat and severe pain 7. RECURRENT SYMPTOM: "Have you ever had this type of stomach pain before?" If Yes, ask: "When was the last time?" and "What happened that time?"      For the last 1.5 months 8. CAUSE: "What do you think is causing the stomach pain?"     Hernia is hard 10. OTHER SYMPTOMS: "Do you have any other symptoms?" (e.g., back pain, diarrhea, fever, urination pain, vomiting)       Cold and hot sweats  Protocols used: Abdominal Pain - Female-A-AH, Hernia-A-AH

## 2023-04-26 ENCOUNTER — Emergency Department
Admission: EM | Admit: 2023-04-26 | Discharge: 2023-04-26 | Disposition: A | Discharge status: Home / Self Care | Payer: 59 | Attending: Emergency Medicine | Admitting: Emergency Medicine

## 2023-04-26 DIAGNOSIS — K429 Umbilical hernia without obstruction or gangrene: Secondary | ICD-10-CM

## 2023-04-26 NOTE — ED Provider Notes (Signed)
[facility] Provider Note    Event Date/Time   First MD Initiated Contact with Patient 04/26/23 0128     (approximate)   History   Chief Complaint Hernia   HPI  Suzanne Moses is a 38 y.o. female with past medical history of hypertension, bipolar disorder, and chronic pain syndrome who presents to the ED complaining of hernia.  Patient reports that she has been dealing with an umbilical hernia for 7 years, but over the past month she has had intermittent episodes where her abdomen "gets firm" and she has increasing pain across it.  She reports 3 of these episodes in the past 3 weeks, however they typically resolve on their own with resolution of pain.  She denies any active pain at this time, states she has been able to reduce her hernia without difficulty today.  She has not had any nausea or vomiting, describes normal bowel movements.     Physical Exam   Triage Vital Signs: ED Triage Vitals  Encounter Vitals Group     BP 04/25/23 2118 (!) 143/81     Systolic BP Percentile --      Diastolic BP Percentile --      Pulse Rate 04/25/23 2118 90     Resp 04/25/23 2118 18     Temp 04/25/23 2118 97.9 F (36.6 C)     Temp Source 04/25/23 2118 Oral     SpO2 04/25/23 2118 90 %     Weight 04/25/23 2119 280 lb (127 kg)     Height 04/25/23 2119 5\' 4"  (1.626 m)     Head Circumference --      Peak Flow --      Pain Score 04/25/23 2119 1     Pain Loc --      Pain Education --      Exclude from Growth Chart --     Most recent vital signs: Vitals:   04/25/23 2118 04/26/23 0121  BP: (!) 143/81 133/74  Pulse: 90 71  Resp: 18 18  Temp: 97.9 F (36.6 C) 97.7 F (36.5 C)  SpO2: 90% 100%    Constitutional: Alert and oriented. Eyes: Conjunctivae are normal. Head: Atraumatic. Nose: No congestion/rhinnorhea. Mouth/Throat: Mucous membranes are moist.  Cardiovascular: Normal rate, regular rhythm. Grossly normal heart sounds.  2+ radial pulses  bilaterally. Respiratory: Normal respiratory effort.  No retractions. Lungs CTAB. Gastrointestinal: Soft and nontender.  Easily reducible umbilical hernia that is nontender.  No distention. Musculoskeletal: No lower extremity tenderness nor edema.  Neurologic:  Normal speech and language. No gross focal neurologic deficits are appreciated.    ED Results / Procedures / Treatments   Labs (all labs ordered are listed, but only abnormal results are displayed) Labs Reviewed  CBC - Abnormal; Notable for the following components:      Result Value   WBC 12.2 (*)    RDW 17.1 (*)    All other components within normal limits  COMPREHENSIVE METABOLIC PANEL - Abnormal; Notable for the following components:   Glucose, Bld 112 (*)    Calcium 8.8 (*)    All other components within normal limits   RADIOLOGY Abdominal ultrasound reviewed and interpreted by me with fat-containing umbilical hernia.  PROCEDURES:  Critical Care performed: No  Procedures   MEDICATIONS ORDERED IN ED: Medications - No data to display   IMPRESSION / MDM / ASSESSMENT AND PLAN / ED COURSE  I reviewed the triage vital signs and the nursing notes.  37 y.o. female with past medical history of hypertension, bipolar disorder, and chronic pain syndrome who presents to the ED complaining of intermittent episodes of abdominal pain and distention for the past couple of weeks.  Patient's presentation is most consistent with acute presentation with potential threat to life or bodily function.  Differential diagnosis includes, but is not limited to, incarcerated hernia, strangulated hernia, fat-containing hernia, kidney stone, UTI, anemia, electrolyte abnormality, AKI.  Patient nontoxic-appearing and in no acute distress, vital signs are unremarkable.  Patient denies any abdominal pain at this time and her abdominal exam is benign with easily reducible umbilical hernia.  Ultrasound shows  fat-containing hernia and I have no concerns for incarceration or strangulation at this time.  Labs with mild leukocytosis but no significant anemia, electrolyte abnormality, or AKI.  LFTs are unremarkable, no symptoms to suggest UTI.  Patient appropriate for discharge home with general surgery follow-up, was counseled to return to the ED for new or worsening symptoms.  Patient agrees with plan.      FINAL CLINICAL IMPRESSION(S) / ED DIAGNOSES   Final diagnoses:  Umbilical hernia without obstruction and without gangrene     Rx / DC Orders   ED Discharge Orders     None        Note:  This document was prepared using Dragon voice recognition software and may include unintentional dictation errors.   Chesley Noon, MD 04/26/23 [phone]

## 2023-05-05 ENCOUNTER — Telehealth: Payer: Self-pay | Admitting: *Deleted

## 2023-05-05 NOTE — Progress Notes (Signed)
Transition Care Management Follow-up Telephone Call Date of discharge and from where: [facility]  04/26/2023 How have you been since you were released from the hospital? Doing well Any questions or concerns? No  Items Reviewed: Did the pt receive and understand the discharge instructions provided? No  Medications obtained and verified? Yes  Other? No  Any new allergies since your discharge? No  Dietary orders reviewed? No Do you have support at home? No     Follow up appointments reviewed:  PCP Hospital f/u appt confirmed? No    Are transportation arrangements needed? No  If their condition worsens, is the pt aware to call PCP or go to the Emergency Dept.? Yes Was the patient provided with contact information for the PCP's office or ED? Yes Was to pt encouraged to call back with questions or concerns? Yes

## 2023-06-03 ENCOUNTER — Other Ambulatory Visit: Payer: Self-pay

## 2023-06-03 ENCOUNTER — Ambulatory Visit (INDEPENDENT_AMBULATORY_CARE_PROVIDER_SITE_OTHER): Payer: 59 | Admitting: Family Medicine

## 2023-06-03 ENCOUNTER — Ambulatory Visit: Payer: Self-pay | Admitting: Family Medicine

## 2023-06-03 ENCOUNTER — Encounter: Payer: Self-pay | Admitting: Family Medicine

## 2023-06-03 VITALS — BP 128/78 | HR 109 | Temp 98.3°F | Resp 18 | Ht 64.0 in | Wt 272.0 lb

## 2023-06-03 DIAGNOSIS — H5711 Ocular pain, right eye: Secondary | ICD-10-CM | POA: Diagnosis not present

## 2023-06-03 NOTE — Patient Instructions (Signed)
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
## 2023-06-03 NOTE — Telephone Encounter (Signed)
 Chief Complaint: Eye symptoms Symptoms: Left eye pain (headache), left eye blurred vision, left eye twitching, sparks of light in both eyes, whooshing sound in ears Frequency: Intermittent Pertinent Negatives: Patient denies eye discharge Disposition: [] ED /[] Urgent Care (no appt availability in office) / [x] Appointment(In office/virtual)/ []  [place] [facility]/ [] Home Care/ [] Refused Recommended Disposition /[] [place] [facility]/ []  Follow-up with PCP Additional Notes: Pt states she has had intermittent left eye pain for one year, intermittent left eye blurred vision for a couple of months, and intermittent left eye twitching for five days. Pt states this morning she had sparks of light in both eyes that has now gone away. Pt states Dr. Sherrie Mustache had referred her to an eye doctor last year but she was no able to be seen as they did not take her insurance. Pt scheduled for an appointment today based off new symptom of sparks of light. This RN educated pt on home care, new-worsening symptoms, when to call back/seek emergent care. Pt verbalized understanding and agrees to plan.  Copied from CRM [id number]. Topic: Clinical - Red Word Triage >> Jun 03, 2023 12:49 PM Suzanne Moses wrote: Red Word that prompted transfer to Nurse Triage: patient called stated she is getting sparks of light and jumping/pain in the left eye and blurred vision Reason for Disposition  MODERATE eye pain or discomfort (e.g., interferes with normal activities or awakens from sleep; more than mild)  Answer Assessment - Initial Assessment Questions 1. ONSET: "When did the pain start?" (e.g., minutes, hours, days)     Left eye pain for one year, left eye blurred vision for a couple of months, left eye twitching for five days, sparks of light in both eyes and happened today for first time 2. TIMING: "Does the pain come and go, or has it been constant since it started?" (e.g., constant, intermittent, fleeting)     Intermittent 3.  SEVERITY: "How bad is the pain?"   (Scale 1-10; mild, moderate or severe)   - MILD (1-3): doesn'[place] interfere with normal activities    - MODERATE (4-7): interferes with normal activities or awakens from sleep    - SEVERE (8-10): excruciating pain and patient unable to do normal activities     When pain happens in left eye it feels like the "back of eye," pressure, 7/10 pain 4. LOCATION: "Where does it hurt?"  (e.g., eyelid, eye, cheekbone)     Left eye 5. CAUSE: "What do you think is causing the pain?"     Not sure 6. VISION: "Do you have blurred vision or changes in your vision?"      Left eye ongoing for a couple months 7. EYE DISCHARGE: "Is there any discharge (pus) from the eye(s)?"  If Yes, ask: "What color is it?"      Denies 8. OTHER SYMPTOMS: "Do you have any other symptoms?" (e.g., headache, nasal discharge, facial rash)     Headache  Protocols used: Eye Pain and Other Symptoms-A-AH

## 2023-08-18 ENCOUNTER — Ambulatory Visit: Admitting: Family Medicine

## 2023-10-04 ENCOUNTER — Other Ambulatory Visit: Payer: Self-pay | Admitting: Family Medicine

## 2023-10-25 ENCOUNTER — Other Ambulatory Visit: Payer: Self-pay | Admitting: Family Medicine

## 2023-11-07 DIAGNOSIS — Z79899 Other long term (current) drug therapy: Secondary | ICD-10-CM | POA: Diagnosis not present

## 2023-11-19 ENCOUNTER — Ambulatory Visit: Payer: 59

## 2023-11-19 DIAGNOSIS — Z0000 Encounter for general adult medical examination without abnormal findings: Secondary | ICD-10-CM | POA: Diagnosis not present

## 2023-11-19 NOTE — Patient Instructions (Addendum)
 Ms. Wardrop , Thank you for taking time out of your busy schedule to complete your Annual Wellness Visit with me. I enjoyed our conversation and look forward to speaking with you again next year. I, as well as your care team,  appreciate your ongoing commitment to your health goals. Please review the following plan we discussed and let me know if I can assist you in the future.   Follow up Visits: 11/24/24 @ 11:30 AM BY PHONE We will see or speak with you next year for your Next Medicare AWV with our clinical staff Have you seen your provider in the last 6 months (3 months if uncontrolled diabetes)? Yes  Clinician Recommendations:  Aim for 30 minutes of exercise or brisk walking, 6-8 glasses of water, and 5 servings of fruits and vegetables each day. TAKE CARE!      This is a list of the screenings recommended for you:  Health Maintenance  Topic Date Due   COVID-19 Vaccine (1) Never done   Hepatitis C Screening  Never done   Pneumococcal Vaccine for high risk medical condition (1 of 2 - PCV) Never done   HPV Vaccine (1 - 3-dose SCDM series) Never done   Flu Shot  11/07/2023   Pap with HPV screening  01/03/2024   Medicare Annual Wellness Visit  11/18/2024   DTaP/Tdap/Td vaccine (7 - Td or Tdap) 11/28/2031   Hepatitis B Vaccine  Completed   HIV Screening  Completed   Meningitis B Vaccine  Aged Out    Advanced directives: (ACP Link)Information on Advanced Care Planning can be found at [place]  Secretary of [facility] Advance Health Care Directives Advance Health Care Directives. [email or web address]  Advance Care Planning is important because it:  [x]  Makes sure you receive the medical care that is consistent with your values, goals, and preferences  [x]  It provides guidance to your family and loved ones and reduces their decisional burden about whether or not they are making the right decisions based on your wishes.  Follow the link provided in your after visit summary or read over the paperwork we  have mailed to you to help you started getting your Advance Directives in place. If you need assistance in completing these, please reach out to us  so that we can help you!

## 2023-11-19 NOTE — Progress Notes (Signed)
 Subjective:   Suzanne Moses is a 38 y.o. who presents for a Medicare Wellness preventive visit.  As a reminder, Annual Wellness Visits don't include a physical exam, and some assessments may be limited, especially if this visit is performed virtually. We may recommend an in-person follow-up visit with your provider if needed.  Visit Complete: Virtual I connected with  Driscilla Almarie Side on 11/19/23 by a audio enabled telemedicine application and verified that I am speaking with the correct person using two identifiers.  Patient Location: Home  Provider Location: Home Office  I discussed the limitations of evaluation and management by telemedicine. The patient expressed understanding and agreed to proceed.  Vital Signs: Because this visit was a virtual/telehealth visit, some criteria may be missing or patient reported. Any vitals not documented were not able to be obtained and vitals that have been documented are patient reported.  VideoDeclined- This patient declined Librarian, academic. Therefore the visit was completed with audio only.  Persons Participating in Visit: Patient.  AWV Questionnaire: No: Patient Medicare AWV questionnaire was not completed prior to this visit.  Cardiac Risk Factors include: advanced age (>46men, >4 women);hypertension;sedentary lifestyle;obesity (BMI >30kg/m2);smoking/ tobacco exposure     Objective:    Today's Vitals   11/19/23 1136  PainSc: 3    There is no height or weight on file to calculate BMI.     11/19/2023   11:46 AM 04/25/2023    9:21 PM 11/06/2022    9:41 AM 03/08/2022   11:58 AM 02/23/2022    7:35 PM 01/01/2022   12:24 AM 03/28/2020    6:35 PM  Advanced Directives  Does Patient Have a Medical Advance Directive? No No No No No No No  Would patient like information on creating a medical advance directive? No - Patient declined   No - Patient declined No - Patient declined No - Patient declined      Current Medications (verified) Outpatient Encounter Medications as of 11/19/2023  Medication Sig   acetaminophen  (TYLENOL ) 325 MG tablet Take 2 tablets (650 mg total) by mouth every 4 (four) hours as needed (for pain scale < 4).   ALPRAZolam (XANAX) 0.5 MG tablet Take 0.5 mg by mouth 4 (four) times daily as needed.   amphetamine-dextroamphetamine (ADDERALL) 30 MG tablet    benzocaine -Menthol  (DERMOPLAST) 20-0.5 % AERO Apply 1 Application topically as needed for irritation (perineal discomfort).   Buprenorphine  HCl-Naloxone  HCl 8-2 MG FILM Place under the tongue.   chlorhexidine (PERIDEX) 0.12 % solution    clindamycin (CLINDAGEL) 1 % gel Apply 1 Application topically 2 (two) times daily.   ferrous sulfate  325 (65 FE) MG tablet Take 1 tablet (325 mg total) by mouth 2 (two) times daily with a meal.   triamcinolone  cream (KENALOG ) 0.1 %    triamcinolone  cream (KENALOG ) 0.5 % APPLY TO AFFECTED AREA(s) TWICE DAILY   ziprasidone (GEODON) 60 MG capsule Take 60 mg by mouth 2 (two) times daily.   ziprasidone (GEODON) 80 MG capsule Take 80 mg by mouth 2 (two) times daily with a meal.   coconut oil OIL Apply 1 Application topically as needed. (Patient not taking: Reported on 11/19/2023)   famotidine (PEPCID) 20 MG tablet Take 20 mg by mouth daily. (Patient not taking: Reported on 11/19/2023)   fluticasone (FLONASE) 50 MCG/ACT nasal spray  (Patient not taking: Reported on 11/19/2023)   ibuprofen  (ADVIL ) 600 MG tablet Take 1 tablet (600 mg total) by mouth every 6 (six) hours. (Patient  not taking: Reported on 11/19/2023)   meloxicam (MOBIC) 15 MG tablet Take 15 mg by mouth daily. (Patient not taking: Reported on 11/19/2023)   methadone  (DOLOPHINE ) 10 MG tablet  (Patient not taking: Reported on 11/19/2023)   NIFEdipine  (ADALAT  CC) 30 MG 24 hr tablet Take 1 tablet (30 mg total) by mouth daily. (Patient not taking: Reported on 11/19/2023)   phentermine  15 MG capsule TAKE 1 CAPSULE BY MOUTH ONCE EVERY MORNING  (Patient not taking: Reported on 11/19/2023)   topiramate  (TOPAMAX ) 50 MG tablet Take 1 tablet (50 mg total) by mouth daily. (Patient not taking: Reported on 11/19/2023)   traMADol  (ULTRAM ) 50 MG tablet  (Patient not taking: Reported on 11/19/2023)   No facility-administered encounter medications on file as of 11/19/2023.    Allergies (verified) Zofran [ondansetron hcl]   History: Past Medical History:  Diagnosis Date   Abdominal pain affecting pregnancy 07/29/2015   Anxiety    Bipolar disorder (HCC)    Hematoma 10/19/2011   Labor and delivery, indication for care 09/19/2015   Nausea and vomiting of pregnancy, antepartum 05/24/2015   Obesity complicating pregnancy, third trimester (CODE) 09/19/2015   Patient non-compliant, refused intervention or support    history of leaving AMA, 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   Polysubstance abuse (HCC)    cocaine, marijuana, methadone , benzos   Pregnancy 05/24/2015   Umbilical hernia    Past Surgical History:  Procedure Laterality Date   OVARY SURGERY     THROAT SURGERY  2015   Remove a polyp   TONSILLECTOMY     UNILATERAL SALPINGECTOMY Left 2013   Family History  Problem Relation Age of Onset   Diabetes Mellitus I Mother    Diabetes Mellitus I Father    Hypertension Father    Lung cancer Maternal Aunt    Lung cancer Maternal Uncle    Cervical cancer Maternal Grandmother    Social History   Socioeconomic History   Marital status: Significant Other    Spouse name: Not on file   Number of children: Not on file   Years of education: Not on file   Highest education level: Not on file  Occupational History   Not on file  Tobacco Use   Smoking status: Every Day    Current packs/day: 0.50    Types: Cigarettes    Passive exposure: Current   Smokeless tobacco: Never   Tobacco comments:    Trying to cut back on cigarettes since preg test positive.  Vaping Use   Vaping status: Never Used  Substance and Sexual Activity   Alcohol use: No    Drug use: Not Currently    Comment: methadone  stopped 2017   Sexual activity: Yes    Birth control/protection: Surgical    Comment: BTL  Other Topics Concern   Not on file  Social History Narrative   Not on file   Social Drivers of Health   Financial Resource Strain: Low Risk  (11/19/2023)   Overall Financial Resource Strain (CARDIA)    Difficulty of Paying Living Expenses: Not very hard  Food Insecurity: No Food Insecurity (11/19/2023)   Hunger Vital Sign    Worried About Running Out of Food in the Last Year: Never true    Ran Out of Food in the Last Year: Never true  Transportation Needs: No Transportation Needs (11/19/2023)   PRAPARE - Administrator, Civil Service (Medical): No    Lack of Transportation (Non-Medical): No  Physical Activity: Insufficiently Active (11/19/2023)  Exercise Vital Sign    Days of Exercise per Week: 2 days    Minutes of Exercise per Session: 20 min  Stress: No Stress Concern Present (11/19/2023)   [employer] of Occupational Health - Occupational Stress Questionnaire    Feeling of Stress: Only a little  Social Connections: Moderately Isolated (11/19/2023)   Social Connection and Isolation Panel    Frequency of Communication with Friends and Family: More than three times a week    Frequency of Social Gatherings with Friends and Family: Once a week    Attends Religious Services: Never    Database administrator or Organizations: No    Attends Engineer, structural: Never    Marital Status: Living with partner    Tobacco Counseling Ready to quit: Not Answered Counseling given: Not Answered Tobacco comments: Trying to cut back on cigarettes since preg test positive.    Clinical Intake:  Pre-visit preparation completed: Yes  Pain : 0-10 Pain Score: 3  Pain Type: Chronic pain Pain Location: Knee Pain Orientation: Left Pain Radiating Towards: right hurts too Pain Descriptors / Indicators: Aching, Discomfort,  Constant Pain Onset: More than a month ago Pain Frequency: Constant Pain Relieving Factors: Goody powder  Pain Relieving Factors: Goody powder  BMI - recorded: 46.7 Nutritional Status: BMI > 30  Obese Nutritional Risks: None Diabetes: No  Lab Results  Component Value Date   HGBA1C 5.5 03/03/2023   HGBA1C 6.5 (H) 01/01/2022   HGBA1C 5.4 07/29/2015     How often do you need to have someone help you when you read instructions, pamphlets, or other written materials from your doctor or pharmacy?: 1 - Never  Interpreter Needed?: No  Information entered by :: JHONNIE DAS, LPN   Activities of Daily Living    11/19/2023   11:47 AM  In your present state of health, do you have any difficulty performing the following activities:  Hearing? 0  Vision? 0  Difficulty concentrating or making decisions? 0  Walking or climbing stairs? 0  Dressing or bathing? 0  Doing errands, shopping? 0  Preparing Food and eating ? N  Using the Toilet? N  In the past six months, have you accidently leaked urine? N  Do you have problems with loss of bowel control? N  Managing your Medications? N  Managing your Finances? N  Housekeeping or managing your Housekeeping? N    Patient Care Team: Gasper Nancyann BRAVO, MD as PCP - General (Family Medicine)  I have updated your Care Teams any recent Medical Services you may have received from other providers in the past year.     Assessment:   This is a routine wellness examination for Suzanne Moses.  Hearing/Vision screen Hearing Screening - Comments:: NO AIDS Vision Screening - Comments:: NO GLASSES   Goals Addressed             This Visit's Progress    DIET - EAT MORE FRUITS AND VEGETABLES         Depression Screen     11/19/2023   11:44 AM 06/03/2023    4:42 PM 03/03/2023    3:19 PM 11/18/2022   11:30 AM 11/06/2022    9:30 AM 02/08/2022   11:57 AM 05/22/2021   10:53 AM  PHQ 2/9 Scores  PHQ - 2 Score 2 2 2 3 2 1  0  PHQ- 9 Score 3 8 10 13 7 6       Fall Risk     11/19/2023   11:47  AM 03/03/2023    3:19 PM 11/18/2022   11:30 AM 11/06/2022    9:24 AM 02/08/2022   11:57 AM  Fall Risk   Falls in the past year? 0 0 0 0 0  Number falls in past yr: 0 0  0 0  Injury with Fall? 0 0 1 0 0  Risk for fall due to : No Fall Risks No Fall Risks No Fall Risks No Fall Risks No Fall Risks  Follow up Falls evaluation completed;Falls prevention discussed  Falls evaluation completed Education provided;Falls prevention discussed Falls evaluation completed      Data saved with a previous flowsheet row definition    MEDICARE RISK AT HOME:  Medicare Risk at Home Any stairs in or around the home?: No If so, are there any without handrails?: No Home free of loose throw rugs in walkways, pet beds, electrical cords, etc?: Yes Adequate lighting in your home to reduce risk of falls?: Yes Life alert?: No Use of a cane, walker or w/c?: No Grab bars in the bathroom?: No Shower chair or bench in shower?: No Elevated toilet seat or a handicapped toilet?: No  TIMED UP AND GO:  Was the test performed?  No  Cognitive Function: 6CIT completed        11/19/2023   11:50 AM 11/06/2022    9:42 AM  6CIT Screen  What Year? 0 points 0 points  What month? 0 points 0 points  What time? 0 points 0 points  Count back from 20 0 points 0 points  Months in reverse 0 points 0 points  Repeat phrase 0 points 0 points  Total Score 0 points 0 points    Immunizations Immunization History  Administered Date(s) Administered   DTP 11/22/1988, 08/19/1989, 09/18/1990, 10/30/1992   DTaP 11/22/1988, 08/19/1989, 09/18/1990, 10/30/1992   HIB (PRP-OMP) 08/19/1989   Hepatitis A, Ped/Adol-2 Dose 01/18/2000, 08/15/2000   Hepatitis B 01/18/2000, 02/22/2000, 08/15/2000   IPV 11/22/1988, 08/19/1989, 09/18/1990, 10/30/1992   Influenza, Seasonal, Injecte, Preservative Fre 03/03/2023   Influenza,inj,Quad PF,6+ Mos 02/22/2021   MMR 08/19/1989, 10/30/1992   OPV 11/22/1988,  08/19/1989, 09/18/1990, 10/30/1992   Td 08/23/2008   Tdap 11/27/2021    Screening Tests Health Maintenance  Topic Date Due   COVID-19 Vaccine (1) Never done   Hepatitis C Screening  Never done   Pneumococcal Vaccine: 19-49 Years (1 of 2 - PCV) Never done   HPV VACCINES (1 - 3-dose SCDM series) Never done   INFLUENZA VACCINE  11/07/2023   Cervical Cancer Screening (HPV/Pap Cotest)  01/03/2024   Medicare Annual Wellness (AWV)  11/18/2024   DTaP/Tdap/Td (7 - Td or Tdap) 11/28/2031   Hepatitis B Vaccines  Completed   HIV Screening  Completed   Meningococcal B Vaccine  Aged Out    Health Maintenance  Health Maintenance Due  Topic Date Due   COVID-19 Vaccine (1) Never done   Hepatitis C Screening  Never done   Pneumococcal Vaccine: 19-49 Years (1 of 2 - PCV) Never done   HPV VACCINES (1 - 3-dose SCDM series) Never done   INFLUENZA VACCINE  11/07/2023   Cervical Cancer Screening (HPV/Pap Cotest)  01/03/2024   Health Maintenance Items Addressed: UP[ TO DATE ON TDAP, FLU- NEEDS COVID SHOTS  Additional Screening:  Vision Screening: Recommended annual ophthalmology exams for early detection of glaucoma and other disorders of the eye. Would you like a referral to an eye doctor? No    Dental Screening: Recommended annual dental exams for proper  oral hygiene  Community Resource Referral / Chronic Care Management: CRR required this visit?  No   CCM required this visit?  No   Plan:    I have personally reviewed and noted the following in the patient's chart:   Medical and social history Use of alcohol, tobacco or illicit drugs  Current medications and supplements including opioid prescriptions. Patient is not currently taking opioid prescriptions. Functional ability and status Nutritional status Physical activity Advanced directives List of other physicians Hospitalizations, surgeries, and ER visits in previous 12 months Vitals Screenings to include cognitive,  depression, and falls Referrals and appointments  In addition, I have reviewed and discussed with patient certain preventive protocols, quality metrics, and best practice recommendations. A written personalized care plan for preventive services as well as general preventive health recommendations were provided to patient.   Jhonnie GORMAN Das, LPN   [id number]   After Visit Summary: (MyChart) Due to this being a telephonic visit, the after visit summary with patients personalized plan was offered to patient via MyChart   Notes: Nothing significant to report at this time.

## 2023-12-04 DIAGNOSIS — Z79899 Other long term (current) drug therapy: Secondary | ICD-10-CM | POA: Diagnosis not present

## 2023-12-19 ENCOUNTER — Encounter: Admitting: Family Medicine

## 2023-12-22 ENCOUNTER — Encounter: Payer: Self-pay | Admitting: Family Medicine

## 2023-12-22 ENCOUNTER — Ambulatory Visit (INDEPENDENT_AMBULATORY_CARE_PROVIDER_SITE_OTHER): Admitting: Family Medicine

## 2023-12-22 VITALS — BP 135/76 | HR 99 | Resp 16 | Ht 65.0 in | Wt 207.0 lb

## 2023-12-22 DIAGNOSIS — Z23 Encounter for immunization: Secondary | ICD-10-CM

## 2023-12-22 DIAGNOSIS — D508 Other iron deficiency anemias: Secondary | ICD-10-CM

## 2023-12-22 DIAGNOSIS — G8929 Other chronic pain: Secondary | ICD-10-CM | POA: Diagnosis not present

## 2023-12-22 DIAGNOSIS — Z1159 Encounter for screening for other viral diseases: Secondary | ICD-10-CM | POA: Diagnosis not present

## 2023-12-22 DIAGNOSIS — Z0000 Encounter for general adult medical examination without abnormal findings: Secondary | ICD-10-CM

## 2023-12-22 DIAGNOSIS — R519 Headache, unspecified: Secondary | ICD-10-CM | POA: Diagnosis not present

## 2023-12-22 DIAGNOSIS — E559 Vitamin D deficiency, unspecified: Secondary | ICD-10-CM

## 2023-12-22 MED ORDER — TOPIRAMATE 50 MG PO TABS: ORAL_TABLET | ORAL | 0 refills | Status: AC
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
## 2023-12-22 NOTE — Patient Instructions (Addendum)
 Please review the attached list of medications and notify my office if there are any errors.   Stop taking Goodie's powder and start taking 1 (one) 325mg  coated aspirin every day. You can take an extra strength Tylenol  once or twice a day for headaches.

## 2024-01-16 ENCOUNTER — Encounter: Payer: Self-pay | Admitting: Oncology

## 2024-01-29 DIAGNOSIS — Z79899 Other long term (current) drug therapy: Secondary | ICD-10-CM | POA: Diagnosis not present

## 2024-02-02 NOTE — Patient Instructions (Incomplete)
 Preventive Care 28-36 Years Old, Female  Preventive care refers to lifestyle choices and visits with your health care provider that can promote health and wellness. Preventive care visits are also called wellness exams. What can I expect for my preventive care visit? Counseling During your preventive care visit, your health care provider may ask about your: Medical history, including: Past medical problems. Family medical history. Pregnancy history. Current health, including: Menstrual cycle. Method of birth control. Emotional well-being. Home life and relationship well-being. Sexual activity and sexual health. Lifestyle, including: Alcohol, nicotine or tobacco, and drug use. Access to firearms. Diet, exercise, and sleep habits. Work and work Astronomer. Sunscreen use. Safety issues such as seatbelt and bike helmet use. Physical exam Your health care provider may check your: Height and weight. These may be used to calculate your BMI (body mass index). BMI is a measurement that tells if you are at a healthy weight. Waist circumference. This measures the distance around your waistline. This measurement also tells if you are at a healthy weight and may help predict your risk of certain diseases, such as type 2 diabetes and high blood pressure. Heart rate and blood pressure. Body temperature. Skin for abnormal spots. What immunizations do I need?  Vaccines are usually given at various ages, according to a schedule. Your health care provider will recommend vaccines for you based on your age, medical history, and lifestyle or other factors, such as travel or where you work. What tests do I need? Screening Your health care provider may recommend screening tests for certain conditions. This may include: Pelvic exam and Pap test. Lipid and cholesterol levels. Diabetes screening. This is done by checking your blood sugar (glucose) after you have not eaten for a while  (fasting). Hepatitis B test. Hepatitis C test. HIV (human immunodeficiency virus) test. STI (sexually transmitted infection) testing, if you are at risk. BRCA-related cancer screening. This may be done if you have a family history of breast, ovarian, tubal, or peritoneal cancers. Talk with your health care provider about your test results, treatment options, and if necessary, the need for more tests. Follow these instructions at home: Eating and drinking  Eat a healthy diet that includes fresh fruits and vegetables, whole grains, lean protein, and low-fat dairy products. Take vitamin and mineral supplements as recommended by your health care provider. Do not drink alcohol if: Your health care provider tells you not to drink. You are pregnant, may be pregnant, or are planning to become pregnant. If you drink alcohol: Limit how much you have to 0-1 drink a day. Know how much alcohol is in your drink. In the [place] one drink equals one 36 oz bottle of beer (355 mL), one 5 oz glass of wine (148 mL), or one 1 oz glass of hard liquor (44 mL). Lifestyle Brush your teeth every morning and night with fluoride toothpaste. Floss one time each day. Exercise for at least 30 minutes 5 or more days each week. Do not use any products that contain nicotine or tobacco. These products include cigarettes, chewing tobacco, and vaping devices, such as e-cigarettes. If you need help quitting, ask your health care provider. Do not use drugs. If you are sexually active, practice safe sex. Use a condom or other form of protection to prevent STIs. If you do not wish to become pregnant, use a form of birth control. If you plan to become pregnant, see your health care provider for a prepregnancy visit. Find healthy ways to manage stress, such as:  Meditation, yoga, or listening to music. Journaling. Talking to a trusted person. Spending time with friends and family. Minimize exposure to UV radiation to reduce your  risk of skin cancer. Safety Always wear your seat belt while driving or riding in a vehicle. Do not drive: If you have been drinking alcohol. Do not ride with someone who has been drinking. If you have been using any mind-altering substances or drugs. While texting. When you are tired or distracted. Wear a helmet and other protective equipment during sports activities. If you have firearms in your house, make sure you follow all gun safety procedures. Seek help if you have been physically or sexually abused. What's next? Go to your health care provider once a year for an annual wellness visit. Ask your health care provider how often you should have your eyes and teeth checked. Stay up to date on all vaccines. This information is not intended to replace advice given to you by your health care provider. Make sure you discuss any questions you have with your health care provider. Document Revised: 09/20/2020 Document Reviewed: 09/20/2020 Elsevier Patient Education  2024 [employer] Inc.     How to Do a Breast Self-Exam Doing breast self-exams can help you stay healthy. They're one way to know what's normal for your breasts. They can help you catch a problem while it's still small and can be treated. You need to: Check your breasts often. Tell your doctor about any changes. You should do breast self-exams even if you have breast implants. What you need: A mirror. A well-lit room. A pillow or other soft object. How to do a breast self-exam Look for changes  Take off all the clothes above your waist. Stand in front of a mirror in a room with good lighting. Put your hands down at your sides. Compare your breasts in the mirror. Look for difference between them, such as: Differences in shape. Differences in size. Wrinkles, dips, and bumps in one breast and not the other. Look at each breast for skin changes, such as: Redness. Scaly spots. Spots where your skin is  thicker. Dimpling. Open sores. Look for changes in your nipples, such as: Fluid coming out of a nipple. Fluid around a nipple. Bleeding. Dimpling. Redness. A nipple that looks pushed in or that has changed position. Feel for changes Lie on your back. Feel each breast. To do this: Pick a breast to feel. Place a pillow under the shoulder closest to that breast. Put the arm closest to that breast behind your head. Feel the breast using the hand of your other arm. Use the pads of your three middle fingers to make small circles starting near the nipple. Use light, medium, and firm pressure. Keep making circles, moving down over the breast. Stop when you feel your ribs. Start making circles with your fingers again, this time going up until you reach your collarbone. Then, make circles out across your breast and into your armpit area. Squeeze your nipple. Check for fluid and lumps. Do these steps again to check your other breast. Sit or stand in the tub or shower. With soapy water on your skin, feel each breast the same way you did when you were lying down. Write down what you find Writing down what you find can help you keep track of what you want to tell your doctor. Write down: What's normal for each breast. Any changes you find. Write down: The kind of change. If your breast feels tender or painful.  Any lump you find. Write down its size and where it is. When you last had your period. General tips If you're breastfeeding, the best time to check your breasts is after you feed your baby or after you use a breast pump. If you get a period, the best time to check your breasts is 5-7 days after your period ends. With time, you'll get more used to doing the self-exam. You'll also start to know if there are changes in your breasts. Contact a doctor if: You see a change in the shape or size of your breasts or nipples. You see a change in the skin of your breast or nipples. You have fluid  coming from your nipples that isn't normal. You find a new lump or thick area. You have breast pain. You have any concerns about your breast health. This information is not intended to replace advice given to you by your health care provider. Make sure you discuss any questions you have with your health care provider. Document Revised: 06/04/2023 Document Reviewed: 06/04/2023 Elsevier Patient Education  2025 [employer].

## 2024-02-02 NOTE — Progress Notes (Deleted)
 GYNECOLOGY ANNUAL PHYSICAL EXAM PROGRESS NOTE  Subjective:    Suzanne Moses is a 38 y.o. [phone] female who presents for an annual exam.  The patient {is/is not/has never been:13135} sexually active. The patient participates in regular exercise: {yes/no/not asked:9010}. Has the patient ever been transfused or tattooed?: {yes/no/not asked:9010}. The patient reports that there {is/is not:9024} domestic violence in her life.   The patient has the following complaints today:   Menstrual History: Menarche age: *** No LMP recorded.     Gynecologic History:  Contraception: {method:5051} History of STI's:  Last Pap: 01/02/2021. Results were: normal.  Denies/Notes h/o abnormal pap smears. Last mammogram: Not age appropriate       OB History  Gravida Para Term Preterm AB Living  3 3 1 2  0 3  SAB IAB Ectopic Multiple Live Births  0 0 0 0 3    # Outcome Date GA Lbr Len/2nd Weight Sex Type Anes PTL Lv  3 Preterm 01/01/22 [redacted]w[redacted]d  8 lb 5.7 oz (3.79 kg) M Vag-Spont EPI  LIV     Name: Nall,BOY Tyler     Apgar1: 0  Apgar5: 8  2 Term 09/20/15 [redacted]w[redacted]d / 02:09 6 lb 13.7 oz (3.11 kg) F Vag-Spont EPI  LIV     Name: Lykens,GIRL Aspasia     Apgar1: 8  Apgar5: 9  1 Preterm 01/25/14 [redacted]w[redacted]d  5 lb 4 oz (2.381 kg) F Vag-Spont  Y LIV     Complications: Preterm premature rupture of membranes    Past Medical History:  Diagnosis Date   Abdominal pain affecting pregnancy 07/29/2015   Anxiety    Bipolar disorder (HCC)    Hematoma 10/19/2011   Herpes zoster without complication 02/02/2020   Labor and delivery, indication for care 09/19/2015   Nausea and vomiting of pregnancy, antepartum 05/24/2015   Obesity complicating pregnancy, third trimester (CODE) 09/19/2015   Patient non-compliant, refused intervention or support    history of leaving AMA, 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   Polysubstance abuse (HCC)    cocaine, marijuana, methadone , benzos   Pregnancy 05/24/2015   Umbilical hernia     Past  Surgical History:  Procedure Laterality Date   OVARY SURGERY     THROAT SURGERY  2015   Remove a polyp   TONSILLECTOMY     UNILATERAL SALPINGECTOMY Left 2013    Family History  Problem Relation Age of Onset   Diabetes Mellitus I Mother    Diabetes Mellitus I Father    Hypertension Father    Lung cancer Maternal Aunt    Lung cancer Maternal Uncle    Cervical cancer Maternal Grandmother     Social History   Socioeconomic History   Marital status: Significant Other    Spouse name: Not on file   Number of children: Not on file   Years of education: Not on file   Highest education level: Not on file  Occupational History   Not on file  Tobacco Use   Smoking status: Every Day    Current packs/day: 0.50    Types: Cigarettes    Passive exposure: Current   Smokeless tobacco: Never   Tobacco comments:    Trying to cut back on cigarettes since preg test positive.  Vaping Use   Vaping status: Never Used  Substance and Sexual Activity   Alcohol use: No   Drug use: Not Currently    Comment: methadone  stopped 2017   Sexual activity: Yes    Birth control/protection: Surgical  Comment: BTL  Other Topics Concern   Not on file  Social History Narrative   Not on file   Social Drivers of Health   Financial Resource Strain: Low Risk  (11/19/2023)   Overall Financial Resource Strain (CARDIA)    Difficulty of Paying Living Expenses: Not very hard  Food Insecurity: No Food Insecurity (11/19/2023)   Hunger Vital Sign    Worried About Running Out of Food in the Last Year: Never true    Ran Out of Food in the Last Year: Never true  Transportation Needs: No Transportation Needs (11/19/2023)   PRAPARE - Administrator, Civil Service (Medical): No    Lack of Transportation (Non-Medical): No  Physical Activity: Insufficiently Active (11/19/2023)   Exercise Vital Sign    Days of Exercise per Week: 2 days    Minutes of Exercise per Session: 20 min  Stress: No Stress Concern  Present (11/19/2023)   [employer] of Occupational Health - Occupational Stress Questionnaire    Feeling of Stress: Only a little  Social Connections: Moderately Isolated (11/19/2023)   Social Connection and Isolation Panel    Frequency of Communication with Friends and Family: More than three times a week    Frequency of Social Gatherings with Friends and Family: Once a week    Attends Religious Services: Never    Database Administrator or Organizations: No    Attends Banker Meetings: Never    Marital Status: Living with partner  Intimate Partner Violence: Not At Risk (11/06/2022)   Humiliation, Afraid, Rape, and Kick questionnaire    Fear of Current or Ex-Partner: No    Emotionally Abused: No    Physically Abused: No    Sexually Abused: No    Current Outpatient Medications on File Prior to Visit  Medication Sig Dispense Refill   acetaminophen  (TYLENOL ) 325 MG tablet Take 2 tablets (650 mg total) by mouth every 4 (four) hours as needed (for pain scale < 4).     ALPRAZolam (XANAX) 0.5 MG tablet Take 0.5 mg by mouth 4 (four) times daily as needed.     amphetamine-dextroamphetamine (ADDERALL) 20 MG tablet Take 20 mg by mouth daily.     benzocaine -Menthol  (DERMOPLAST) 20-0.5 % AERO Apply 1 Application topically as needed for irritation (perineal discomfort).     Buprenorphine  HCl-Naloxone  HCl 8-2 MG FILM Place under the tongue.     chlorhexidine (PERIDEX) 0.12 % solution      clindamycin (CLINDAGEL) 1 % gel Apply 1 Application topically 2 (two) times daily.     famotidine (PEPCID) 20 MG tablet Take 20 mg by mouth daily. (Patient not taking: Reported on 12/22/2023)     ferrous sulfate  325 (65 FE) MG tablet Take 1 tablet (325 mg total) by mouth 2 (two) times daily with a meal. (Patient not taking: Reported on 12/22/2023) 60 tablet 2   topiramate  (TOPAMAX ) 50 MG tablet 1/2 tablet every morning for 7 days, then 1/2 tablet twice a day for 7 days, then increase to 1 tablet every  morning and 1/2 every night for 7 days, then 1 tablet twice a day 60 tablet 0   triamcinolone  cream (KENALOG ) 0.1 %  (Patient not taking: Reported on 12/22/2023)     triamcinolone  cream (KENALOG ) 0.5 % APPLY TO AFFECTED AREA(s) TWICE DAILY 30 g 1   ziprasidone (GEODON) 60 MG capsule Take 60 mg by mouth 2 (two) times daily.     No current facility-administered medications on file prior to  visit.    Allergies  Allergen Reactions   Zofran [Ondansetron Hcl] Hives     Review of Systems Constitutional: negative for chills, fatigue, fevers and sweats Eyes: negative for irritation, redness and visual disturbance Ears, nose, mouth, throat, and face: negative for hearing loss, nasal congestion, snoring and tinnitus Respiratory: negative for asthma, cough, sputum Cardiovascular: negative for chest pain, dyspnea, exertional chest pressure/discomfort, irregular heart beat, palpitations and syncope Gastrointestinal: negative for abdominal pain, change in bowel habits, nausea and vomiting Genitourinary: negative for abnormal menstrual periods, genital lesions, sexual problems and vaginal discharge, dysuria and urinary incontinence Integument/breast: negative for breast lump, breast tenderness and nipple discharge Hematologic/lymphatic: negative for bleeding and easy bruising Musculoskeletal:negative for back pain and muscle weakness Neurological: negative for dizziness, headaches, vertigo and weakness Endocrine: negative for diabetic symptoms including polydipsia, polyuria and skin dryness Allergic/Immunologic: negative for hay fever and urticaria      Objective:  There were no vitals taken for this visit. There is no height or weight on file to calculate BMI.    General Appearance:    Alert, cooperative, no distress, appears stated age  Head:    Normocephalic, without obvious abnormality, atraumatic  Eyes:    PERRL, conjunctiva/corneas clear, EOM's intact, both eyes  Ears:    Normal external  ear canals, both ears  Nose:   Nares normal, septum midline, mucosa normal, no drainage or sinus tenderness  Throat:   Lips, mucosa, and tongue normal; teeth and gums normal  Neck:   Supple, symmetrical, trachea midline, no adenopathy; thyroid : no enlargement/tenderness/nodules; no carotid bruit or JVD  Back:     Symmetric, no curvature, ROM normal, no CVA tenderness  Lungs:     Clear to auscultation bilaterally, respirations unlabored  Chest Wall:    No tenderness or deformity   Heart:    Regular rate and rhythm, S1 and S2 normal, no murmur, rub or gallop  Breast Exam:    No tenderness, masses, or nipple abnormality  Abdomen:     Soft, non-tender, bowel sounds active all four quadrants, no masses, no organomegaly.    Genitalia:    Pelvic:external genitalia normal, vagina without lesions, discharge, or tenderness, rectovaginal septum  normal. Cervix normal in appearance, no cervical motion tenderness, no adnexal masses or tenderness.  Uterus normal size, shape, mobile, regular contours, nontender.  Rectal:    Normal external sphincter.  No hemorrhoids appreciated. Internal exam not done.   Extremities:   Extremities normal, atraumatic, no cyanosis or edema  Pulses:   2+ and symmetric all extremities  Skin:   Skin color, texture, turgor normal, no rashes or lesions  Lymph nodes:   Cervical, supraclavicular, and axillary nodes normal  Neurologic:   CNII-XII intact, normal strength, sensation and reflexes throughout   .  Labs:  Lab Results  Component Value Date   WBC 12.2 (H) 04/25/2023   HGB 13.1 04/25/2023   HCT 41.6 04/25/2023   MCV 83.0 04/25/2023   PLT 293 04/25/2023    Lab Results  Component Value Date   CREATININE 0.76 04/25/2023   BUN 14 04/25/2023   NA 138 04/25/2023   K 4.0 04/25/2023   CL 102 04/25/2023   CO2 26 04/25/2023    Lab Results  Component Value Date   ALT 20 04/25/2023   AST 20 04/25/2023   ALKPHOS 76 04/25/2023   BILITOT 0.4 04/25/2023    Lab Results   Component Value Date   TSH 1.630 03/03/2023     Assessment:  1. Encounter for well woman exam with routine gynecological exam   2. Cervical cancer screening      Plan:  Blood tests: {blood tests:13147}. Breast self exam technique reviewed and patient encouraged to perform self-exam monthly. Contraception: {contraceptive methods:5051}. Discussed healthy lifestyle modifications. Mammogram : Not age appropriate Pap smear ordered. Flu vaccine: 12/22/2023 Follow up in 1 year for annual exam   Damien Parsley, CNM [facility]

## 2024-02-04 ENCOUNTER — Encounter: Admitting: Certified Nurse Midwife

## 2024-02-04 DIAGNOSIS — Z124 Encounter for screening for malignant neoplasm of cervix: Secondary | ICD-10-CM

## 2024-02-04 DIAGNOSIS — Z01419 Encounter for gynecological examination (general) (routine) without abnormal findings: Secondary | ICD-10-CM

## 2024-02-11 ENCOUNTER — Encounter: Payer: Self-pay | Admitting: Certified Nurse Midwife

## 2024-11-24 ENCOUNTER — Ambulatory Visit
